# Patient Record
Sex: Female | Born: 1935
Health system: Southern US, Community
[De-identification: ages and names within clinical notes are randomized; demographics above are authoritative.]

## PROBLEM LIST (undated history)

## (undated) DIAGNOSIS — I499 Cardiac arrhythmia, unspecified: Secondary | ICD-10-CM

## (undated) DIAGNOSIS — I700 Atherosclerosis of aorta: Secondary | ICD-10-CM

## (undated) DIAGNOSIS — Z7901 Long term (current) use of anticoagulants: Secondary | ICD-10-CM

## (undated) DIAGNOSIS — I4510 Unspecified right bundle-branch block: Secondary | ICD-10-CM

## (undated) DIAGNOSIS — M722 Plantar fascial fibromatosis: Secondary | ICD-10-CM

## (undated) DIAGNOSIS — I779 Disorder of arteries and arterioles, unspecified: Secondary | ICD-10-CM

## (undated) DIAGNOSIS — Z8616 Personal history of COVID-19: Secondary | ICD-10-CM

## (undated) DIAGNOSIS — K429 Umbilical hernia without obstruction or gangrene: Secondary | ICD-10-CM

## (undated) DIAGNOSIS — N3281 Overactive bladder: Secondary | ICD-10-CM

## (undated) DIAGNOSIS — I10 Essential (primary) hypertension: Secondary | ICD-10-CM

## (undated) DIAGNOSIS — I482 Chronic atrial fibrillation, unspecified: Secondary | ICD-10-CM

## (undated) DIAGNOSIS — M1990 Unspecified osteoarthritis, unspecified site: Secondary | ICD-10-CM

## (undated) DIAGNOSIS — I2510 Atherosclerotic heart disease of native coronary artery without angina pectoris: Secondary | ICD-10-CM

## (undated) DIAGNOSIS — E785 Hyperlipidemia, unspecified: Secondary | ICD-10-CM

## (undated) DIAGNOSIS — D6869 Other thrombophilia: Secondary | ICD-10-CM

## (undated) HISTORY — PX: TONSILLECTOMY: SUR1361

## (undated) HISTORY — DX: Essential (primary) hypertension: I10

## (undated) HISTORY — DX: Cardiac arrhythmia, unspecified: I49.9

## (undated) HISTORY — PX: JOINT REPLACEMENT: SHX530

---

## 2005-06-21 ENCOUNTER — Ambulatory Visit: Payer: Self-pay | Admitting: Family Medicine

## 2006-03-07 ENCOUNTER — Emergency Department: Payer: Self-pay | Admitting: General Practice

## 2006-11-05 LAB — HM PAP SMEAR: HM Pap smear: NORMAL

## 2006-11-05 LAB — HM MAMMOGRAPHY: HM Mammogram: NORMAL

## 2011-03-07 LAB — FECAL OCCULT BLOOD, GUAIAC: Fecal Occult Blood: NEGATIVE

## 2011-11-05 ENCOUNTER — Encounter: Payer: Self-pay | Admitting: Internal Medicine

## 2011-11-05 ENCOUNTER — Ambulatory Visit (INDEPENDENT_AMBULATORY_CARE_PROVIDER_SITE_OTHER): Payer: Medicare Other | Admitting: Internal Medicine

## 2011-11-05 VITALS — BP 140/72 | HR 80 | Temp 97.9°F | Resp 16 | Ht 63.0 in | Wt 145.2 lb

## 2011-11-05 DIAGNOSIS — R002 Palpitations: Secondary | ICD-10-CM | POA: Insufficient documentation

## 2011-11-05 DIAGNOSIS — I482 Chronic atrial fibrillation, unspecified: Secondary | ICD-10-CM | POA: Insufficient documentation

## 2011-11-05 DIAGNOSIS — I4891 Unspecified atrial fibrillation: Secondary | ICD-10-CM

## 2011-11-05 DIAGNOSIS — I10 Essential (primary) hypertension: Secondary | ICD-10-CM | POA: Insufficient documentation

## 2011-11-05 MED ORDER — DILTIAZEM HCL ER 120 MG PO CP12: 120.0000 mg | ORAL_CAPSULE | Freq: Every day | ORAL | Status: DC

## 2011-11-05 NOTE — Assessment & Plan Note (Signed)
Atrial fib by today's ekg.

## 2011-11-05 NOTE — Assessment & Plan Note (Addendum)
Well controlled on lisinopril .  Increasing diltiazem to twice daily will likely lower it some more. Has history of hives reaction to prior bp medications and was treated at the [facility] ER for same.  Lists Maxzide, HTCz losartan and metoprolol as allergies but not sure which of the 4 she was taking at the time.

## 2011-11-05 NOTE — Progress Notes (Signed)
Patient ID: Laurie [name], female   DOB: 24-Feb-1931, 78 y.o.   MRN: [id number]  Patient Active Problem List  Diagnoses  . Heart palpitations  . Hypertension  . Atrial fibrillation    Subjective:  CC:   Chief Complaint  Patient presents with  . New Patient    HPI:   Laurie [name] a 78 y.o. female who presents New patient. Transferring from [facility]. [facility] in [place] .  Last seen there Fall for annual  exam without a pelvic.  Normal labs then.,  Hasa history of  hypertension and palpitations which typically have been ocurring in the evening at rest.  Feels like it races at times. Former PCP put her on bedtime diltiazem, no toehr workup. Has been takign the diltiazem at night.  Gets 6 to 7  Hours of sleep wakes up refreshed.   No bowel or bladder issues.  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   Has a lawnman  And son manages the financial responsibilities.  Feels overwhelmed . The lawnman takes care of everything but the flowers.    Enjoys taking care of flowers  Nut does not go for walks on the 16 acres like she used to before husbnad became frail. Afraid to walk alone.  Enjoys cooking and sewing,  Belongs to [employer].    Has seasonal allergies managed with chlor trimeton ,  No aspirin,  No tylenol on a regular basis.    No past medical history on file.  Past Surgical History  Procedure Date  . Tonsillectomy          The following portions of the patient's history were reviewed and updated as appropriate: Allergies, current medications, and problem list.    Review of Systems:   12 Pt  review of systems was negative except those addressed in the HPI,     History   Social History  . Marital Status: Married    Spouse Name: N/A    Number of Children: N/A  . Years of Education: N/A   Occupational History  . Not on file.   Social  History Main Topics  . Smoking status: Never Smoker   . Smokeless tobacco: Never Used  . Alcohol Use: No  . Drug Use: No  . Sexually Active: Not on file   Other Topics Concern  . Not on file   Social History Narrative  . No narrative on file    Objective:  BP 140/72  Pulse 80  Temp(Src) 97.9 F (36.6 C) (Oral)  Resp 16  Ht 5\' 3"  (1.6 m)  Wt 145 lb 4 oz (65.885 kg)  BMI 25.73 kg/m2  SpO2 97%  General appearance: alert, cooperative and appears stated age Ears: normal TM's and external ear canals both ears Throat: lips, mucosa, and tongue normal; teeth and gums normal Neck: no adenopathy, no carotid bruit, supple, symmetrical, trachea midline and thyroid not enlarged, symmetric, no tenderness/mass/nodules Back: symmetric, no curvature. ROM normal. No CVA tenderness. Lungs: clear to auscultation bilaterally Heart: irreg irreg rhythm , normal S1, S2 normal, no murmur, click, rub or gallop Abdomen: soft, non-tender; bowel sounds normal; no masses,  no organomegaly Pulses: 2+ and symmetric Skin: Skin color, texture, turgor normal. No rashes or lesions Lymph nodes: Cervical, supraclavicular, and axillary nodes normal.  Assessment and Plan:  Hypertension Well controlled on lisinopril .  Increasing diltiazem to twice daily will likely lower it some more. Has history of hives reaction to prior bp medications and was treated at the [facility] ER for same.  Lists Maxzide, HTCz losartan and metoprolol as allergies but not sure which of the 4 she was taking at the time.   Atrial fibrillation Not sure if she has always been in atrial fib since outside records are not available.  CHADS2 score is 2 at first glance,  Needs ECHO and labs which were done last fall by prior PCP, no history of DM or thyroid.  Resume full strength aspirin for now, refer to [facility] for eval.   Heart palpitations Atrial fib by today's ekg.      Updated Medication List Outpatient Encounter Prescriptions  as of 11/05/2011  Medication Sig Dispense Refill  . chlorpheniramine (CHLOR-TRIMETON) 4 MG tablet Take 4 mg by mouth 2 (two) times daily as needed.      . diltiazem (CARDIZEM SR) 120 MG 12 hr capsule Take 1 capsule (120 mg total) by mouth daily.  180 capsule  3  . fish oil-omega-3 fatty acids 1000 MG capsule Take 2 g by mouth daily.      Marland Kitchen lisinopril (PRINIVIL,ZESTRIL) 40 MG tablet Take 40 mg by mouth daily.      . Multiple Vitamins-Minerals (OSTEO COMPLEX PO) Take by mouth.      . DISCONTD: diltiazem (CARDIZEM SR) 120 MG 12 hr capsule Take 120 mg by mouth daily.         Orders Placed This Encounter  Procedures  . HM MAMMOGRAPHY  . HM PAP SMEAR  . Fecal Occult Blood, Guaiac  . Electrocardiogram report    No Follow-up on file.

## 2011-11-05 NOTE — Assessment & Plan Note (Signed)
Not sure if she has always been in atrial fib since outside records are not available.  CHADS2 score is 2 at first glance,  Needs ECHO and labs which were done last fall by prior PCP, no history of DM or thyroid.  Resume full strength aspirin for now, refer to [facility] for eval.

## 2011-11-05 NOTE — Patient Instructions (Signed)
Your heart appears to be irregular due to a rhythm called atrial fibrillation  Please start taking  a full strength aspirin daily until you are seen by the cardiologist, and increase the diltiazem to twice daily

## 2011-11-08 ENCOUNTER — Ambulatory Visit: Payer: Medicare Other | Admitting: Cardiovascular Disease

## 2011-11-16 ENCOUNTER — Encounter: Payer: Self-pay | Admitting: Cardiovascular Disease

## 2011-11-16 ENCOUNTER — Ambulatory Visit (INDEPENDENT_AMBULATORY_CARE_PROVIDER_SITE_OTHER): Payer: Medicare Other | Admitting: Cardiovascular Disease

## 2011-11-16 VITALS — BP 173/80 | HR 76 | Ht 63.0 in | Wt 144.0 lb

## 2011-11-16 DIAGNOSIS — I4891 Unspecified atrial fibrillation: Secondary | ICD-10-CM

## 2011-11-16 DIAGNOSIS — I10 Essential (primary) hypertension: Secondary | ICD-10-CM

## 2011-11-16 MED ORDER — RIVAROXABAN 20 MG PO TABS: 20.0000 mg | ORAL_TABLET | Freq: Every day | ORAL | Status: DC

## 2011-11-16 NOTE — Patient Instructions (Addendum)
Labs today.  Start Xarelto 20 mg once daily (Blood thinner)  Stop taking Aspirin.   Your physician has requested that you have an echocardiogram. Echocardiography is a painless test that uses sound waves to create images of your heart. It provides your doctor with information about the size and shape of your heart and how well your heart's chambers and valves are working. This procedure takes approximately one hour. There are no restrictions for this procedure.  Follow up after echo.

## 2011-11-17 LAB — CBC WITH DIFFERENTIAL/PLATELET
Basophils Absolute: 0.1 10*3/uL (ref 0.0–0.2)
HCT: 41.1 % (ref 34.0–46.6)
Immature Granulocytes: 0 % (ref 0–2)
Lymphocytes Absolute: 2 10*3/uL (ref 0.7–4.5)
MCHC: 34.1 g/dL (ref 31.5–35.7)
Monocytes Absolute: 0.3 10*3/uL (ref 0.1–1.0)
Monocytes: 4 % (ref 4–13)
RDW: 14.4 % (ref 12.3–15.4)

## 2011-11-17 LAB — HEPATIC FUNCTION PANEL
ALT: 23 IU/L (ref 0–32)
AST: 22 IU/L (ref 0–40)
Bilirubin, Direct: 0.15 mg/dL (ref 0.00–0.40)
Total Bilirubin: 0.6 mg/dL (ref 0.0–1.2)

## 2011-11-17 LAB — BASIC METABOLIC PANEL
GFR calc Af Amer: 91 mL/min/{1.73_m2} (ref >59)
GFR calc non Af Amer: 79 mL/min/{1.73_m2} (ref >59)
Potassium: 4.9 mmol/L (ref 3.5–5.2)
Sodium: 141 mmol/L (ref 134–144)

## 2011-11-17 LAB — PROTIME-INR: Prothrombin Time: 10.9 s (ref 9.1–12.0)

## 2011-11-19 NOTE — Assessment & Plan Note (Signed)
Patient is currently in atrial fibrillation. The exact onset if this is not very clear that based on her symptoms is at least of few months duration.  The ventricular rate seems to be reasonably controlled with current dose of diltiazem. Other than palpitations, she seems to be overall minimally symptomatic from this. Continue with rate control for now. The main issue that was discussed extensively today is her future risk of stroke prevention with anticoagulation. Her CHADS2 score is 2  and CHA2DS2 Vasc score is 4 corresponding with an annual stroke risk of 4%. Thus, I recommend anticoagulation. I discussed different options with her and decided to start Xarelto 20 mg once daily. I will obtain routine labs today including thyroid function. I will request an echocardiogram for further evaluation. If her atrial size is not significantly dilated, I favor cardioversion after a minimum of 3 weeks of anticoagulation to try to maintain sinus rhythm.

## 2011-11-19 NOTE — Progress Notes (Signed)
HPI  Laurie [name] a 78 y.o. female who was referred by Dr. Darrick Huntsman for evaluation and management of atrial fibrillation. She recently saw her as a new patient. She transferred care from [facility]. She had a history of hypertension and palpitations which typically have been ocurring in the evening at rest. Former PCP put her on bedtime diltiazem in April. She is not aware of history of arrhythmia or atrial fibrillation. The dose of diltiazem was recently changed to twice daily.  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 Has a lawnman And son manages the financial responsibilities. Feels overwhelmed . The lawnman takes care of everything but the flowers. Enjoys taking care of flowers. She is very active and has no significant physical limitations. She denies chest pain, dyspnea, orthopnea or PND. There has been no recent syncope, presyncope or falls. She was noted to be in atrial fibrillation during her last visit. She has no previous history of stroke.  Allergies  Allergen Reactions  . Hydrochlorothiazide   . Losartan   . Metoprolol   . Triamterene      Current Outpatient Prescriptions on File Prior to Visit  Medication Sig Dispense Refill  . chlorpheniramine (CHLOR-TRIMETON) 4 MG tablet Take 4 mg by mouth 2 (two) times daily as needed.      . diltiazem (CARDIZEM SR) 120 MG 12 hr capsule Take 120 mg by mouth 2 (two) times daily.      . fish oil-omega-3 fatty acids 1000 MG capsule Take 1 g by mouth daily.       Marland Kitchen lisinopril (PRINIVIL,ZESTRIL) 40 MG tablet Take 40 mg by mouth daily.      . Multiple Vitamins-Minerals (OSTEO COMPLEX PO) Take by mouth 2 (two) times daily.       . Rivaroxaban (XARELTO) 20 MG TABS Take 20 mg by mouth daily.  30 tablet  6     Past Medical History  Diagnosis Date  . Hypertension   . Arrhythmia      Past Surgical History  Procedure Date  . Tonsillectomy       Family History  Problem Relation Age of Onset  . Kidney disease Mother   . Anemia Mother   . Heart disease Father 78     History   Social History  . Marital Status: Married    Spouse Name: N/A    Number of Children: N/A  . Years of Education: N/A   Occupational History  . Not on file.   Social History Main Topics  . Smoking status: Never Smoker   . Smokeless tobacco: Never Used  . Alcohol Use: No  . Drug Use: No  . Sexually Active: Not on file   Other Topics Concern  . Not on file   Social History Narrative  . No narrative on file     ROS Constitutional: Negative for fever, chills, diaphoresis, activity change, appetite change and fatigue.  HENT: Negative for hearing loss, nosebleeds, congestion, sore throat, facial swelling, drooling, trouble swallowing, neck pain, voice change, sinus pressure and tinnitus.  Eyes: Negative for photophobia, pain, discharge and visual disturbance.  Respiratory: Negative for apnea, cough, chest tightness, shortness of breath and wheezing.  Cardiovascular: Negative for chest pain, palpitations and leg swelling.  Gastrointestinal: Negative for nausea, vomiting, abdominal pain, diarrhea, constipation, blood in stool and abdominal distention.  Genitourinary: Negative for dysuria, urgency, frequency, hematuria  and decreased urine volume.  Musculoskeletal: Negative for myalgias, back pain, joint swelling, arthralgias and gait problem.  Skin: Negative for color change, pallor, rash and wound.  Neurological: Negative for dizziness, tremors, seizures, syncope, speech difficulty, weakness, light-headedness, numbness and headaches.  Psychiatric/Behavioral: Negative for suicidal ideas, hallucinations, behavioral problems and agitation. The patient is not nervous/anxious.     PHYSICAL EXAM   BP 173/80  Pulse 76  Ht 5\' 3"  (1.6 m)  Wt 144 lb (65.318 kg)  BMI 25.51 kg/m2  Constitutional: She is oriented to person, place, and time. She  appears well-developed and well-nourished. No distress.  HENT: No nasal discharge.  Head: Normocephalic and atraumatic.  Eyes: Pupils are equal and round. Right eye exhibits no discharge. Left eye exhibits no discharge.  Neck: Normal range of motion. Neck supple. No JVD present. No thyromegaly present.  Cardiovascular: Normal rate, irregular rhythm, normal heart sounds. Exam reveals no gallop and no friction rub. No murmur heard.  Pulmonary/Chest: Effort normal and breath sounds normal. No stridor. No respiratory distress. She has no wheezes. She has no rales. She exhibits no tenderness.  Abdominal: Soft. Bowel sounds are normal. She exhibits no distension. There is no tenderness. There is no rebound and no guarding.  Musculoskeletal: Normal range of motion. She exhibits no edema and no tenderness.  Neurological: She is alert and oriented to person, place, and time. Coordination normal.  Skin: Skin is warm and dry. No rash noted. She is not diaphoretic. No erythema. No pallor.  Psychiatric: She has a normal mood and affect. Her behavior is normal. Judgment and thought content normal.    EKG: Atrial fibrillation with a ventricular rate of 75 beats per minute ABNORMAL RHYTHM    ASSESSMENT AND PLAN

## 2011-11-19 NOTE — Assessment & Plan Note (Signed)
Blood pressure is well controlled. Continue current treatment. 

## 2011-11-28 ENCOUNTER — Other Ambulatory Visit (INDEPENDENT_AMBULATORY_CARE_PROVIDER_SITE_OTHER): Payer: Medicare Other

## 2011-11-28 ENCOUNTER — Other Ambulatory Visit: Payer: Self-pay

## 2011-11-28 DIAGNOSIS — I4891 Unspecified atrial fibrillation: Secondary | ICD-10-CM

## 2011-12-04 ENCOUNTER — Ambulatory Visit: Payer: Medicare Other | Admitting: Cardiovascular Disease

## 2011-12-07 ENCOUNTER — Encounter: Payer: Self-pay | Admitting: Cardiovascular Disease

## 2011-12-07 ENCOUNTER — Ambulatory Visit (INDEPENDENT_AMBULATORY_CARE_PROVIDER_SITE_OTHER): Payer: Medicare Other | Admitting: Cardiovascular Disease

## 2011-12-07 VITALS — BP 170/80 | HR 88 | Ht 63.0 in | Wt 147.0 lb

## 2011-12-07 DIAGNOSIS — I10 Essential (primary) hypertension: Secondary | ICD-10-CM

## 2011-12-07 DIAGNOSIS — I4891 Unspecified atrial fibrillation: Secondary | ICD-10-CM

## 2011-12-07 NOTE — Patient Instructions (Addendum)
Continue same medications.  Monitor your blood pressure 2-3 times/week. The goal is less than 140/90.  Follow up in 6 months.

## 2011-12-07 NOTE — Assessment & Plan Note (Signed)
Patient is currently in atrial fibrillation. The exact onset if this is not very clear that it seems that she had this mention in her record in April of 2012 at [facility].  The ventricular rate seems to be reasonably controlled with current dose of diltiazem. Other than palpitations, she seems to be overall minimally symptomatic from this. Continue with rate control for now. Continue anticoagulation with Xarelto 20 mg daily which she seems to be tolerating well.  Her labs were unremarkable. Echocardiogram showed normal LV systolic function with mildly dilated left atrium. Given that she is minimally symptomatic from atrial fibrillation at this time, and the duration of A. fib is likely more than 6 months, I would continue with rate control at this time without attempting cardioversion.

## 2011-12-07 NOTE — Progress Notes (Signed)
HPI  Laurie [name] a 78 y.o. female who is here today for a followup visit for atrial fibrillation. She used to be seen at [facility]. It appears that she has history of paroxysmal atrial fibrillation for a while. It was mentioned in her records from April 2012. She had a stress echo done at that time which showed no evidence of ischemia. She is being treated with diltiazem for rate control. During her first visit with me, I started her on Xarelto for anticoagulation. I did labs which were completely unremarkable. She is doing well. She is very active and has no significant physical limitations. She denies chest pain, dyspnea, orthopnea or PND. There has been no recent syncope, presyncope or falls.     Allergies  Allergen Reactions  . Hydrochlorothiazide   . Losartan   . Metoprolol   . Triamterene      Current Outpatient Prescriptions on File Prior to Visit  Medication Sig Dispense Refill  . chlorpheniramine (CHLOR-TRIMETON) 4 MG tablet Take 4 mg by mouth 2 (two) times daily as needed.      . diltiazem (CARDIZEM SR) 120 MG 12 hr capsule Take 120 mg by mouth 2 (two) times daily.      . fish oil-omega-3 fatty acids 1000 MG capsule Take 1 g by mouth daily.       Marland Kitchen lisinopril (PRINIVIL,ZESTRIL) 40 MG tablet Take 40 mg by mouth daily.      . Multiple Vitamins-Minerals (OSTEO COMPLEX PO) Take by mouth 2 (two) times daily.       . Rivaroxaban (XARELTO) 20 MG TABS Take 20 mg by mouth daily.  30 tablet  6     Past Medical History  Diagnosis Date  . Hypertension   . Arrhythmia      Past Surgical History  Procedure Date  . Tonsillectomy      Family History  Problem Relation Age of Onset  . Kidney disease Mother   . Anemia Mother   . Heart disease Father 72     History   Social History  . Marital Status: Married    Spouse Name: N/A    Number of Children: N/A  . Years of Education: N/A   Occupational History  . Not on file.   Social History Main Topics  . Smoking  status: Never Smoker   . Smokeless tobacco: Never Used  . Alcohol Use: No  . Drug Use: No  . Sexually Active: Not on file   Other Topics Concern  . Not on file   Social History Narrative  . No narrative on file      PHYSICAL EXAM   BP 170/80  Pulse 88  Ht 5\' 3"  (1.6 m)  Wt 147 lb (66.679 kg)  BMI 26.04 kg/m2 Constitutional: She is oriented to person, place, and time. She appears well-developed and well-nourished. No distress.  HENT: No nasal discharge.  Head: Normocephalic and atraumatic.  Eyes: Pupils are equal and round. Right eye exhibits no discharge. Left eye exhibits no discharge.  Neck: Normal range of motion. Neck supple. No JVD present. No thyromegaly present.  Cardiovascular: Normal rate, regular rhythm, normal heart sounds. Exam reveals no gallop and no friction rub. No murmur heard.  Pulmonary/Chest: Effort normal and breath sounds normal. No stridor. No respiratory distress. She has no wheezes. She has no rales. She exhibits no tenderness.  Abdominal: Soft. Bowel sounds are normal. She exhibits no distension. There is no tenderness. There is no rebound and no  guarding.  Musculoskeletal: Normal range of motion. She exhibits no edema and no tenderness.  Neurological: She is alert and oriented to person, place, and time. Coordination normal.  Skin: Skin is warm and dry. No rash noted. She is not diaphoretic. No erythema. No pallor.  Psychiatric: She has a normal mood and affect. Her behavior is normal. Judgment and thought content normal.      ASSESSMENT AND PLAN

## 2011-12-07 NOTE — Assessment & Plan Note (Signed)
Her blood pressure is elevated today. She is allergic to multiple blood pressure medications.  She mentions that her blood pressure is always high when she is at the doctor's office. I asked her to monitor her blood pressure at least 2 times a week and bring the readings and followup visit with me or with Dr. Darrick Huntsman.

## 2011-12-14 ENCOUNTER — Telehealth: Payer: Self-pay | Admitting: Cardiovascular Disease

## 2011-12-14 NOTE — Telephone Encounter (Signed)
Xarelto does not cause blurred vision.  Schedule her for carotid Duplex ultrasound. She should see PCP as well.

## 2011-12-14 NOTE — Telephone Encounter (Signed)
LMTCB

## 2011-12-14 NOTE — Telephone Encounter (Signed)
Pt says she has noticed blurred vision over thye last week. She says these symptoms are intermittent and are not associated with any other symptoms such as headache, slurred speech or elevated BP. BP=117/85   She asks if Xarelto can cause this.  I reassured her, based on data re: Xarelto, this is not a known SE.  I told her I would talk with Dr. Kirke Corin to see what we need to do, if anything.  She asks if taking med QOD vs. QD would help.  I strongly advised against this since this is an every day med and is protecting her from CVA secondary to atrial fib.  She verb understanding and will take QD.   In the meantime, I will review with Dr. Kirke Corin to get his advice and call pt back.  Understanding verb.

## 2011-12-14 NOTE — Telephone Encounter (Signed)
Please read below and advise. Thanks

## 2011-12-14 NOTE — Telephone Encounter (Signed)
Pt called stating that she has had some occasional blurred vision and wonders if the xarelto is causing this

## 2011-12-17 NOTE — Telephone Encounter (Signed)
Pt called back.  She was informed of Dr. Jari Sportsman plan.  I stressed to her the importance of staying on the Xarelto and have carotid u/s and f/u with PCP.  Understanding verb. Transferred to [facility] to schedule test.

## 2011-12-26 ENCOUNTER — Other Ambulatory Visit: Payer: Self-pay | Admitting: Cardiology

## 2011-12-26 DIAGNOSIS — H538 Other visual disturbances: Secondary | ICD-10-CM

## 2012-01-01 ENCOUNTER — Encounter (INDEPENDENT_AMBULATORY_CARE_PROVIDER_SITE_OTHER): Payer: Medicare Other

## 2012-01-01 DIAGNOSIS — I6529 Occlusion and stenosis of unspecified carotid artery: Secondary | ICD-10-CM

## 2012-01-01 DIAGNOSIS — H538 Other visual disturbances: Secondary | ICD-10-CM

## 2012-01-14 ENCOUNTER — Telehealth: Payer: Self-pay | Admitting: Internal Medicine

## 2012-01-14 DIAGNOSIS — E041 Nontoxic single thyroid nodule: Secondary | ICD-10-CM

## 2012-01-14 NOTE — Telephone Encounter (Signed)
Please tell ms Laurie [name] that Dr. Kirke Corin found cysts on her thyroid during her recent carotid doppler so I would like her to get  a dedicated thyroid ultrasound. Order in [facility]

## 2012-01-15 ENCOUNTER — Telehealth: Payer: Self-pay | Admitting: Internal Medicine

## 2012-01-15 MED ORDER — DILTIAZEM HCL ER 120 MG PO CP12: 120.0000 mg | ORAL_CAPSULE | Freq: Two times a day (BID) | ORAL | Status: DC

## 2012-01-15 NOTE — Telephone Encounter (Signed)
Patient notified

## 2012-01-15 NOTE — Telephone Encounter (Signed)
Left message asking patient to return my call.

## 2012-01-15 NOTE — Telephone Encounter (Signed)
Patient needs a new prescription faxed into [facility] on [address] [place] for her diltiazem 120mg  she takes 2 a day.  She states that the refill they have on file is for once a day,

## 2012-01-17 ENCOUNTER — Ambulatory Visit: Payer: Self-pay | Admitting: Internal Medicine

## 2012-01-17 ENCOUNTER — Telehealth: Payer: Self-pay | Admitting: Internal Medicine

## 2012-01-17 DIAGNOSIS — E041 Nontoxic single thyroid nodule: Secondary | ICD-10-CM

## 2012-01-17 NOTE — Telephone Encounter (Signed)
Her thyroid ultrasound did show several cystic nodules.  These are usually benign, but very rarely they can be  cancerous .   I would like to set her up with an endocrinologist to discuss whether hers should be biopsied.  Dr. Renae Fickle and Dr. Tedd Sias are  excellent female endocrinologists who works in [place] at the [facility] .  I will arrange for followup on this she would like to go somewhere else

## 2012-01-17 NOTE — Telephone Encounter (Signed)
Patient notified, she says that she is happy to see who every you recommend.

## 2012-01-18 NOTE — Telephone Encounter (Signed)
I would like to repeat her thyroid function test (lab draw) prior to Endocrine visit.   Endocrine referral and lab test  for thyroid cyst in [facility]>

## 2012-01-18 NOTE — Telephone Encounter (Signed)
Left message asking patient to return my call.

## 2012-01-21 NOTE — Telephone Encounter (Signed)
Patient notified.  Lab appt scheduled.

## 2012-01-22 ENCOUNTER — Other Ambulatory Visit (INDEPENDENT_AMBULATORY_CARE_PROVIDER_SITE_OTHER): Payer: Medicare Other | Admitting: *Deleted

## 2012-01-22 DIAGNOSIS — E041 Nontoxic single thyroid nodule: Secondary | ICD-10-CM

## 2012-01-22 LAB — T4, FREE: Free T4: 0.63 ng/dL (ref 0.60–1.60)

## 2012-01-23 ENCOUNTER — Encounter: Payer: Self-pay | Admitting: Internal Medicine

## 2012-01-25 ENCOUNTER — Telehealth: Payer: Self-pay | Admitting: Internal Medicine

## 2012-01-25 NOTE — Telephone Encounter (Signed)
Patient notified

## 2012-01-25 NOTE — Telephone Encounter (Signed)
Patient wanting her lab results

## 2012-02-06 ENCOUNTER — Telehealth: Payer: Self-pay | Admitting: Internal Medicine

## 2012-02-06 MED ORDER — LISINOPRIL 40 MG PO TABS: 40.0000 mg | ORAL_TABLET | Freq: Every day | ORAL | Status: DC

## 2012-02-06 NOTE — Telephone Encounter (Signed)
Refill on Lisinopril 40 mg.

## 2012-02-08 ENCOUNTER — Telehealth: Payer: Self-pay | Admitting: Internal Medicine

## 2012-02-08 NOTE — Telephone Encounter (Signed)
This was done yesterday, I have already told patient, she was very rude and was saying that she has been trying to get this filled since wed. I explained to her that I received the request wed afternoon and we do require 24 to 48 hrs to allow [place] time to fill it and this was filled on Thursday. Patient said that she should not have to wait a whole day for her rx to be filled.

## 2012-02-08 NOTE — Telephone Encounter (Signed)
Pt stated that she has been calling for 3 days checking on her lisinopril 40mg  Pt stated that [facility] [place] [facility] told her that she needs a new rx PLEASE advise pt when this is done Pt stated she has 3 left   Please call pt

## 2012-03-10 ENCOUNTER — Encounter: Payer: Self-pay | Admitting: Internal Medicine

## 2012-03-10 ENCOUNTER — Ambulatory Visit (INDEPENDENT_AMBULATORY_CARE_PROVIDER_SITE_OTHER): Payer: Medicare Other | Admitting: Internal Medicine

## 2012-03-10 VITALS — BP 132/78 | HR 75 | Temp 97.6°F | Ht 64.0 in | Wt 147.2 lb

## 2012-03-10 DIAGNOSIS — Z13220 Encounter for screening for lipoid disorders: Secondary | ICD-10-CM | POA: Insufficient documentation

## 2012-03-10 DIAGNOSIS — Z0000 Encounter for general adult medical examination without abnormal findings: Secondary | ICD-10-CM | POA: Insufficient documentation

## 2012-03-10 DIAGNOSIS — Z7901 Long term (current) use of anticoagulants: Secondary | ICD-10-CM | POA: Insufficient documentation

## 2012-03-10 DIAGNOSIS — Z124 Encounter for screening for malignant neoplasm of cervix: Secondary | ICD-10-CM

## 2012-03-10 DIAGNOSIS — Z79899 Other long term (current) drug therapy: Secondary | ICD-10-CM

## 2012-03-10 DIAGNOSIS — Z1211 Encounter for screening for malignant neoplasm of colon: Secondary | ICD-10-CM

## 2012-03-10 DIAGNOSIS — I10 Essential (primary) hypertension: Secondary | ICD-10-CM

## 2012-03-10 LAB — COMPREHENSIVE METABOLIC PANEL
ALT: 16 U/L (ref 0–35)
Alkaline Phosphatase: 66 U/L (ref 39–117)
BUN: 13 mg/dL (ref 6–23)
CO2: 28 mEq/L (ref 19–32)
Calcium: 9.4 mg/dL (ref 8.4–10.5)
Chloride: 103 mEq/L (ref 96–112)
Creatinine, Ser: 0.8 mg/dL (ref 0.4–1.2)
GFR: 71.47 mL/min (ref >60.00)
Glucose, Bld: 93 mg/dL (ref 70–99)
Sodium: 139 mEq/L (ref 135–145)
Total Bilirubin: 1 mg/dL (ref 0.3–1.2)
Total Protein: 7.3 g/dL (ref 6.0–8.3)

## 2012-03-10 LAB — LIPID PANEL
Cholesterol: 241 mg/dL — ABNORMAL HIGH (ref 0–200)
Triglycerides: 194 mg/dL — ABNORMAL HIGH (ref 0.0–149.0)

## 2012-03-10 LAB — LDL CHOLESTEROL, DIRECT: Direct LDL: 154.1 mg/dL

## 2012-03-10 NOTE — Patient Instructions (Addendum)
You have decided not to have an annual  Mammogram to screen for breast cancer and have declined a flu vaccine  If you change your mind,  Please let me know.  You should try to get 1200 mg of calcium  Daily and 1000 units of Vit D3 daily  Use a chewable calcium tablet once daly and get the remainder from your diet.   You can take an over the counter D3 supplement,  800 to 1000 units daily

## 2012-03-10 NOTE — Progress Notes (Signed)
Patient ID: Laurie [name], female   DOB: 22-Aug-1930, 78 y.o.   MRN: [id number]  The patient is here for annual Medicare wellness examination and management of other chronic and acute problems.   The risk factors are reflected in the social history.  The roster of all physicians providing medical care to patient - is listed in the Snapshot section of the chart.  Activities of daily living:  The patient is 100% independent in all ADLs: dressing, toileting, feeding as well as independent mobility  Home safety : The patient has smoke detectors in the home. They wear seatbelts.  There are no firearms at home. There is no violence in the home.   There is no risks for hepatitis, STDs or HIV. There is no   history of blood transfusion. They have no travel history to infectious disease endemic areas of the world.  The patient has seen their dentist in the last six month. They have seen their eye doctor in the last year. They admit to slight hearing difficulty with regard to whispered voices and some television programs.  They have deferred audiologic testing in the last year.  They do not  have excessive sun exposure. Discussed the need for sun protection: hats, long sleeves and use of sunscreen if there is significant sun exposure.   Diet: the importance of a healthy diet is discussed. They do have a healthy diet.  The benefits of regular aerobic exercise were discussed. She walks 4 times per week ,  20 minutes.   Depression screen: there are no signs or vegative symptoms of depression- irritability, change in appetite, anhedonia, sadness/tearfullness.  Cognitive assessment: the patient manages all their financial and personal affairs and is actively engaged. They could relate day,date,year and events; recalled 2/3 objects at 3 minutes; performed clock-face test normally.  The following portions of the patient's history were reviewed and updated as appropriate: allergies, current medications, past  family history, past medical history,  past surgical history, past social history  and problem list.  Visual acuity was not assessed per patient preference since she has regular follow up with her ophthalmologist. Hearing and body mass index were assessed and reviewed.   During the course of the visit the patient was educated and counseled about appropriate screening and preventive services including : fall prevention , diabetes screening, nutrition counseling, colorectal cancer screening, and recommended immunizations.    BP 132/78  Pulse 75  Temp 97.6 F (36.4 C) (Oral)  Ht 5\' 4"  (1.626 m)  Wt 147 lb 4 oz (66.792 kg)  BMI 25.28 kg/m2  SpO2 98%  General appearance: alert, cooperative and appears stated age Head: Normocephalic, without obvious abnormality, atraumatic Eyes: conjunctivae/corneas clear. PERRL, EOM's intact. Fundi benign. Ears: normal TM's and external ear canals both ears Nose: Nares normal. Septum midline. Mucosa normal. No drainage or sinus tenderness. Throat: lips, mucosa, and tongue normal; teeth and gums normal Neck: no adenopathy, no carotid bruit, no JVD, supple, symmetrical, trachea midline and thyroid not enlarged, symmetric, no tenderness/mass/nodules Lungs: clear to auscultation bilaterally Breasts: normal appearance, no masses or tenderness Heart: regular rate and rhythm, S1, S2 normal, no murmur, click, rub or gallop Abdomen: soft, non-tender; bowel sounds normal; no masses,  no organomegaly Pelvic exam: VULVA: normal appearing vulva with no masses, tenderness or lesions, VAGINA: normal appearing vagina with normal color and discharge, no lesions, CERVIX: normal appearing cervix without discharge or lesions, UTERUS: uterus is normal size, shape, consistency and nontender, ADNEXA: normal adnexa in size,  nontender and no masses.  Extremities: extremities normal, atraumatic, no cyanosis or edema Pulses: 2+ and symmetric Skin: Skin color, texture, turgor normal.  No rashes or lesions Neurologic: Alert and oriented X 3, normal strength and tone. Normal symmetric reflexes. Normal coordination and gait.

## 2012-03-10 NOTE — Assessment & Plan Note (Signed)
Breast pelvic and PAP smear was done today.

## 2012-03-10 NOTE — Assessment & Plan Note (Signed)
She refuses colonoscopy, but prefers to continue annual FOBTs

## 2012-04-04 ENCOUNTER — Telehealth: Payer: Self-pay | Admitting: Internal Medicine

## 2012-04-04 NOTE — Telephone Encounter (Signed)
Caller: [name]/Patient; Patient Name: Laurie [name]; PCP: Duncan Dull (Adults only); Best Callback Phone Number: [phone]  Has had pain on urination with blood in urine today.  No abd or back pain.  No fever.   No appts this afternoon.  Needs to be seen in 24 hours per [facility] protocal.  Scheduled for 0800 at the [facility] office for 11/2.  Gave directions per map quest to patient.   She is to push fluids with decreased caffiene and tomato products.  May use AZO for the discomfort and Tylenol q4 hours PRN.

## 2012-04-05 ENCOUNTER — Ambulatory Visit: Payer: Medicare Other | Admitting: General Practice

## 2012-05-31 ENCOUNTER — Other Ambulatory Visit: Payer: Self-pay | Admitting: Internal Medicine

## 2012-06-16 ENCOUNTER — Encounter: Payer: Self-pay | Admitting: Cardiovascular Disease

## 2012-06-16 ENCOUNTER — Ambulatory Visit (INDEPENDENT_AMBULATORY_CARE_PROVIDER_SITE_OTHER): Payer: Medicare Other | Admitting: Cardiovascular Disease

## 2012-06-16 ENCOUNTER — Other Ambulatory Visit: Payer: Self-pay

## 2012-06-16 VITALS — BP 132/84 | HR 81 | Ht 64.0 in | Wt 146.5 lb

## 2012-06-16 DIAGNOSIS — I4891 Unspecified atrial fibrillation: Secondary | ICD-10-CM

## 2012-06-16 DIAGNOSIS — I10 Essential (primary) hypertension: Secondary | ICD-10-CM

## 2012-06-16 MED ORDER — RIVAROXABAN 20 MG PO TABS: 20.0000 mg | ORAL_TABLET | Freq: Every day | ORAL | Status: DC

## 2012-06-16 NOTE — Progress Notes (Signed)
HPI  [name] is a pleasant 79 y.o. female who is here today for a followup visit regarding chronic atrial fibrillation. This was initially diagnosed in 2012 at [facility]. She had a stress echo done at that time which showed no evidence of ischemia. She is being treated with diltiazem for rate control. Echocardiogram showed normal LV systolic function and mildly dilated left atrium. She was started on anticoagulation with Xarelto. Initially she complained of blurred vision which she thought was due to the medication. We did carotid duplex ultrasound which showed no evidence of obstructive disease. There was incidental finding of thyroid cysts which is being followed by Dr. Darrick Huntsman. Overall, she is doing well and denies any chest pain, dyspnea or palpitations.   Allergies  Allergen Reactions  . Hydrochlorothiazide   . Losartan   . Metoprolol   . Triamterene      Current Outpatient Prescriptions on File Prior to Visit  Medication Sig Dispense Refill  . Calcium-Vitamin D (CALCET PO) Take 2 tablets by mouth 2 (two) times daily at 10 AM and 5 PM.      . chlorpheniramine (CHLOR-TRIMETON) 4 MG tablet Take 4 mg by mouth 2 (two) times daily as needed.      . diltiazem (CARDIZEM SR) 120 MG 12 hr capsule Take 1 capsule (120 mg total) by mouth 2 (two) times daily.  60 capsule  3  . fish oil-omega-3 fatty acids 1000 MG capsule Take 1 g by mouth daily.       Marland Kitchen lisinopril (PRINIVIL,ZESTRIL) 40 MG tablet TAKE 1 TABLET BY MOUTH EVERY DAY  30 tablet  3  . Rivaroxaban (XARELTO) 20 MG TABS Take 20 mg by mouth daily.  30 tablet  6     Past Medical History  Diagnosis Date  . Hypertension   . Arrhythmia      Past Surgical History  Procedure Date  . Tonsillectomy      Family History  Problem Relation Age of Onset  . Kidney disease Mother   . Anemia Mother   . Heart disease Father 64     History   Social History  . Marital Status: Married    Spouse Name: N/A    Number of Children: N/A  .  Years of Education: N/A   Occupational History  . Not on file.   Social History Main Topics  . Smoking status: Never Smoker   . Smokeless tobacco: Never Used  . Alcohol Use: No  . Drug Use: No  . Sexually Active: Not on file   Other Topics Concern  . Not on file   Social History Narrative  . No narrative on file      PHYSICAL EXAM   BP 132/84  Pulse 81  Ht 5\' 4"  (1.626 m)  Wt 146 lb 8 oz (66.452 kg)  BMI 25.15 kg/m2 Constitutional: She is oriented to person, place, and time. She appears well-developed and well-nourished. No distress.  HENT: No nasal discharge.  Head: Normocephalic and atraumatic.  Eyes: Pupils are equal and round. Right eye exhibits no discharge. Left eye exhibits no discharge.  Neck: Normal range of motion. Neck supple. No JVD present. No thyromegaly present.  Cardiovascular: Normal rate, irregular rhythm, normal heart sounds. Exam reveals no gallop and no friction rub. No murmur heard.  Pulmonary/Chest: Effort normal and breath sounds normal. No stridor. No respiratory distress. She has no wheezes. She has no rales. She exhibits no tenderness.  Abdominal: Soft. Bowel sounds are normal. She  exhibits no distension. There is no tenderness. There is no rebound and no guarding.  Musculoskeletal: Normal range of motion. She exhibits no edema and no tenderness.  Neurological: She is alert and oriented to person, place, and time. Coordination normal.  Skin: Skin is warm and dry. No rash noted. She is not diaphoretic. No erythema. No pallor.  Psychiatric: She has a normal mood and affect. Her behavior is normal. Judgment and thought content normal.    EKG: Atrial fibrillation  -Poor R-wave progression -nonspecific -consider old anterior infarct.   ABNORMAL RHYTHM  ASSESSMENT AND PLAN

## 2012-06-16 NOTE — Assessment & Plan Note (Signed)
Blood pressure is well controlled on current medications. 

## 2012-06-16 NOTE — Assessment & Plan Note (Signed)
Her ventricular rate seems to be well-controlled on current dose of diltiazem 120 mg twice daily. She is tolerating anticoagulation without any reported side effects. Continue current management.

## 2012-06-16 NOTE — Patient Instructions (Addendum)
Continue same medications.  Follow up in 6 months.  

## 2012-06-22 ENCOUNTER — Other Ambulatory Visit: Payer: Self-pay | Admitting: Internal Medicine

## 2012-07-22 ENCOUNTER — Ambulatory Visit: Payer: Self-pay | Admitting: Internal Medicine

## 2012-09-09 ENCOUNTER — Encounter: Payer: Self-pay | Admitting: Internal Medicine

## 2012-09-09 ENCOUNTER — Ambulatory Visit (INDEPENDENT_AMBULATORY_CARE_PROVIDER_SITE_OTHER): Payer: Medicare Other | Admitting: Internal Medicine

## 2012-09-09 VITALS — BP 128/78 | HR 74 | Temp 97.8°F | Resp 16 | Wt 144.5 lb

## 2012-09-09 DIAGNOSIS — Z79899 Other long term (current) drug therapy: Secondary | ICD-10-CM

## 2012-09-09 DIAGNOSIS — E785 Hyperlipidemia, unspecified: Secondary | ICD-10-CM

## 2012-09-09 DIAGNOSIS — I4891 Unspecified atrial fibrillation: Secondary | ICD-10-CM

## 2012-09-09 DIAGNOSIS — I10 Essential (primary) hypertension: Secondary | ICD-10-CM

## 2012-09-09 MED ORDER — LEVOFLOXACIN 500 MG PO TABS: 500.0000 mg | ORAL_TABLET | Freq: Every day | ORAL | Status: DC

## 2012-09-09 NOTE — Assessment & Plan Note (Signed)
Rate controlled on diltiazem. Anticoagulated with Xarelto. Marland Kitchen No changes today.

## 2012-09-09 NOTE — Assessment & Plan Note (Signed)
Well controlled on current regimen. Renal function stable, no changes today. 

## 2012-09-09 NOTE — Patient Instructions (Addendum)
Please take the levaquin with you on your trip to [place] in the event you get a bladder or sinus infection   Return for fasting labs so we can recheck your cholesterol
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
## 2012-09-10 ENCOUNTER — Other Ambulatory Visit (INDEPENDENT_AMBULATORY_CARE_PROVIDER_SITE_OTHER): Payer: Medicare Other

## 2012-09-10 DIAGNOSIS — E041 Nontoxic single thyroid nodule: Secondary | ICD-10-CM

## 2012-09-10 DIAGNOSIS — Z79899 Other long term (current) drug therapy: Secondary | ICD-10-CM

## 2012-09-10 DIAGNOSIS — E785 Hyperlipidemia, unspecified: Secondary | ICD-10-CM

## 2012-09-10 LAB — LIPID PANEL
Cholesterol: 229 mg/dL — ABNORMAL HIGH (ref 0–200)
Total CHOL/HDL Ratio: 5
VLDL: 21 mg/dL (ref 0.0–40.0)

## 2012-09-10 LAB — COMPREHENSIVE METABOLIC PANEL
ALT: 17 U/L (ref 0–35)
AST: 17 U/L (ref 0–37)
Albumin: 4 g/dL (ref 3.5–5.2)
CO2: 28 mEq/L (ref 19–32)
Calcium: 9.8 mg/dL (ref 8.4–10.5)
Chloride: 103 mEq/L (ref 96–112)
GFR: 71.38 mL/min (ref >60.00)
Potassium: 4.1 mEq/L (ref 3.5–5.1)
Sodium: 138 mEq/L (ref 135–145)
Total Protein: 7 g/dL (ref 6.0–8.3)

## 2012-09-11 MED ORDER — PRAVASTATIN SODIUM 20 MG PO TABS: 20.0000 mg | ORAL_TABLET | Freq: Every day | ORAL | Status: DC

## 2012-09-11 NOTE — Addendum Note (Signed)
Addended by: Sherlene Shams on: 09/11/2012 11:42 PM   Modules accepted: Orders

## 2012-09-27 ENCOUNTER — Other Ambulatory Visit: Payer: Self-pay | Admitting: Internal Medicine

## 2012-10-16 ENCOUNTER — Other Ambulatory Visit: Payer: Self-pay | Admitting: Internal Medicine

## 2012-10-16 NOTE — Telephone Encounter (Signed)
Rx sent to pharmacy by escript  

## 2012-12-15 ENCOUNTER — Other Ambulatory Visit (INDEPENDENT_AMBULATORY_CARE_PROVIDER_SITE_OTHER): Payer: Medicare Other

## 2012-12-15 ENCOUNTER — Ambulatory Visit: Payer: Medicare Other | Admitting: Cardiovascular Disease

## 2012-12-15 DIAGNOSIS — Z79899 Other long term (current) drug therapy: Secondary | ICD-10-CM

## 2012-12-15 DIAGNOSIS — E785 Hyperlipidemia, unspecified: Secondary | ICD-10-CM

## 2012-12-15 LAB — COMPREHENSIVE METABOLIC PANEL
ALT: 17 U/L (ref 0–35)
AST: 19 U/L (ref 0–37)
Calcium: 9.6 mg/dL (ref 8.4–10.5)
Chloride: 106 mEq/L (ref 96–112)
Creatinine, Ser: 0.9 mg/dL (ref 0.4–1.2)

## 2012-12-15 LAB — LIPID PANEL
LDL Cholesterol: 96 mg/dL (ref 0–99)
Total CHOL/HDL Ratio: 3
Triglycerides: 114 mg/dL (ref 0.0–149.0)

## 2012-12-16 ENCOUNTER — Encounter: Payer: Self-pay | Admitting: *Deleted

## 2012-12-18 ENCOUNTER — Encounter: Payer: Self-pay | Admitting: Cardiovascular Disease

## 2012-12-18 ENCOUNTER — Ambulatory Visit (INDEPENDENT_AMBULATORY_CARE_PROVIDER_SITE_OTHER): Payer: Medicare Other | Admitting: Cardiovascular Disease

## 2012-12-18 VITALS — BP 160/90 | HR 90 | Ht 64.0 in | Wt 146.5 lb

## 2012-12-18 DIAGNOSIS — I10 Essential (primary) hypertension: Secondary | ICD-10-CM

## 2012-12-18 DIAGNOSIS — I4891 Unspecified atrial fibrillation: Secondary | ICD-10-CM

## 2012-12-18 NOTE — Patient Instructions (Addendum)
Continue same medications.  Follow up in 6 months.  

## 2012-12-18 NOTE — Progress Notes (Signed)
HPI  Laurie [name] is a pleasant 79 y.o. female who is here today for a followup visit regarding chronic atrial fibrillation. This was initially diagnosed in 2012 at [facility]. She had a stress echo done at that time which showed no evidence of ischemia. She is being treated with diltiazem for rate control. Echocardiogram showed normal LV systolic function and mildly dilated left atrium. She was started on anticoagulation with Xarelto. Initially she complained of blurred vision which she thought was due to the medication. We did carotid duplex ultrasound which showed no evidence of obstructive disease.  Overall she has been doing well and denies any chest pain, dyspnea or palpitations.  Allergies  Allergen Reactions  . Hydrochlorothiazide   . Losartan   . Metoprolol   . Triamterene      Current Outpatient Prescriptions on File Prior to Visit  Medication Sig Dispense Refill  . Calcium-Vitamin D (CALCET PO) Take 2 tablets by mouth daily.       . chlorpheniramine (CHLOR-TRIMETON) 4 MG tablet Take 4 mg by mouth 2 (two) times daily as needed.      . diltiazem (DILACOR XR) 120 MG 24 hr capsule TAKE 1 CAPSULE (120 MG TOTAL) BY MOUTH 2 (TWO) TIMES DAILY.  60 capsule  5  . fish oil-omega-3 fatty acids 1000 MG capsule Take 1 g by mouth daily.       Marland Kitchen levofloxacin (LEVAQUIN) 500 MG tablet Take 1 tablet (500 mg total) by mouth daily.  7 tablet  0  . lisinopril (PRINIVIL,ZESTRIL) 40 MG tablet TAKE 1 TABLET BY MOUTH EVERY DAY  30 tablet  3  . pravastatin (PRAVACHOL) 20 MG tablet Take 1 tablet (20 mg total) by mouth daily.  90 tablet  3  . Rivaroxaban (XARELTO) 20 MG TABS Take 1 tablet (20 mg total) by mouth daily.  30 tablet  6   No current facility-administered medications on file prior to visit.     Past Medical History  Diagnosis Date  . Hypertension   . Arrhythmia      Past Surgical History  Procedure Laterality Date  . Tonsillectomy       Family History  Problem Relation Age of Onset    . Kidney disease Mother   . Anemia Mother   . Heart disease Father 43     History   Social History  . Marital Status: Married    Spouse Name: N/A    Number of Children: N/A  . Years of Education: N/A   Occupational History  . Not on file.   Social History Main Topics  . Smoking status: Never Smoker   . Smokeless tobacco: Never Used  . Alcohol Use: No  . Drug Use: No  . Sexually Active: Not on file   Other Topics Concern  . Not on file   Social History Narrative  . No narrative on file      PHYSICAL EXAM   BP 160/90  Pulse 90  Ht 5\' 4"  (1.626 m)  Wt 146 lb 8 oz (66.452 kg)  BMI 25.13 kg/m2 Constitutional: She is oriented to person, place, and time. She appears well-developed and well-nourished. No distress.  HENT: No nasal discharge.  Head: Normocephalic and atraumatic.  Eyes: Pupils are equal and round. Right eye exhibits no discharge. Left eye exhibits no discharge.  Neck: Normal range of motion. Neck supple. No JVD present. No thyromegaly present.  Cardiovascular: Normal rate, irregular rhythm, normal heart sounds. Exam reveals no gallop and no  friction rub. No murmur heard.  Pulmonary/Chest: Effort normal and breath sounds normal. No stridor. No respiratory distress. She has no wheezes. She has no rales. She exhibits no tenderness.  Abdominal: Soft. Bowel sounds are normal. She exhibits no distension. There is no tenderness. There is no rebound and no guarding.  Musculoskeletal: Normal range of motion. She exhibits no edema and no tenderness.  Neurological: She is alert and oriented to person, place, and time. Coordination normal.  Skin: Skin is warm and dry. No rash noted. She is not diaphoretic. No erythema. No pallor.  Psychiatric: She has a normal mood and affect. Her behavior is normal. Judgment and thought content normal.    EKG: Atrial fibrillation  -Nonspecific ST changes .  ABNORMAL   ASSESSMENT AND PLAN

## 2012-12-19 NOTE — Assessment & Plan Note (Signed)
Ventricular rate is reasonably controlled and she appears to be asymptomatic. She is on diltiazem extended release 120 mg twice daily. She is tolerating Acticoat relation with Xarelto without reported side effects.

## 2012-12-19 NOTE — Assessment & Plan Note (Signed)
Blood pressure is running high today but that's unusual for her. She has been busy preparing for her grandson wedding. Continue to monitor from now. I will consider increasing the dose of diltiazem in the future.

## 2013-01-08 ENCOUNTER — Other Ambulatory Visit: Payer: Self-pay | Admitting: *Deleted

## 2013-01-09 MED ORDER — LISINOPRIL 40 MG PO TABS: ORAL_TABLET | ORAL | Status: DC

## 2013-01-12 ENCOUNTER — Other Ambulatory Visit: Payer: Self-pay

## 2013-01-12 MED ORDER — RIVAROXABAN 20 MG PO TABS: 20.0000 mg | ORAL_TABLET | Freq: Every day | ORAL | Status: DC

## 2013-01-12 NOTE — Telephone Encounter (Signed)
Refill sent for xarelto  

## 2013-01-18 ENCOUNTER — Other Ambulatory Visit: Payer: Self-pay | Admitting: Internal Medicine

## 2013-02-12 ENCOUNTER — Telehealth: Payer: Self-pay | Admitting: Internal Medicine

## 2013-02-12 DIAGNOSIS — Z789 Other specified health status: Secondary | ICD-10-CM | POA: Insufficient documentation

## 2013-02-12 NOTE — Telephone Encounter (Signed)
Pt states she was prescribed pravastatin 20 mg for cholesterol.  Pt states this caused severe cramping in her legs and she has discontinued the medication.  Pt asking what next step is.  May leave msg on machine or with spouse.

## 2013-02-12 NOTE — Telephone Encounter (Signed)
She can try OTC red yeast rice 600 mg one capsule twice daily.  If that causes cramping, there is nothing else.

## 2013-02-16 NOTE — Telephone Encounter (Signed)
Detailed message left per DPR 

## 2013-03-17 ENCOUNTER — Encounter: Payer: Medicare Other | Admitting: Internal Medicine

## 2013-04-26 ENCOUNTER — Other Ambulatory Visit: Payer: Self-pay | Admitting: Internal Medicine

## 2013-04-29 ENCOUNTER — Encounter (INDEPENDENT_AMBULATORY_CARE_PROVIDER_SITE_OTHER): Payer: Self-pay

## 2013-04-29 ENCOUNTER — Ambulatory Visit (INDEPENDENT_AMBULATORY_CARE_PROVIDER_SITE_OTHER): Payer: Medicare Other | Admitting: Internal Medicine

## 2013-04-29 ENCOUNTER — Encounter: Payer: Self-pay | Admitting: Internal Medicine

## 2013-04-29 ENCOUNTER — Telehealth: Payer: Self-pay | Admitting: Internal Medicine

## 2013-04-29 VITALS — BP 128/64 | HR 62 | Temp 98.1°F | Resp 12 | Ht 63.9 in | Wt 142.5 lb

## 2013-04-29 DIAGNOSIS — I10 Essential (primary) hypertension: Secondary | ICD-10-CM

## 2013-04-29 DIAGNOSIS — E559 Vitamin D deficiency, unspecified: Secondary | ICD-10-CM

## 2013-04-29 DIAGNOSIS — M899 Disorder of bone, unspecified: Secondary | ICD-10-CM

## 2013-04-29 DIAGNOSIS — I4891 Unspecified atrial fibrillation: Secondary | ICD-10-CM

## 2013-04-29 DIAGNOSIS — Z79899 Other long term (current) drug therapy: Secondary | ICD-10-CM

## 2013-04-29 DIAGNOSIS — Z01419 Encounter for gynecological examination (general) (routine) without abnormal findings: Secondary | ICD-10-CM

## 2013-04-29 DIAGNOSIS — M8580 Other specified disorders of bone density and structure, unspecified site: Secondary | ICD-10-CM

## 2013-04-29 DIAGNOSIS — Z1211 Encounter for screening for malignant neoplasm of colon: Secondary | ICD-10-CM

## 2013-04-29 DIAGNOSIS — E785 Hyperlipidemia, unspecified: Secondary | ICD-10-CM

## 2013-04-29 DIAGNOSIS — Z0000 Encounter for general adult medical examination without abnormal findings: Secondary | ICD-10-CM

## 2013-04-29 DIAGNOSIS — Z789 Other specified health status: Secondary | ICD-10-CM

## 2013-04-29 LAB — COMPREHENSIVE METABOLIC PANEL
BUN: 17 mg/dL (ref 6–23)
CO2: 28 mEq/L (ref 19–32)
Calcium: 9.6 mg/dL (ref 8.4–10.5)
Chloride: 105 mEq/L (ref 96–112)
Creatinine, Ser: 0.9 mg/dL (ref 0.4–1.2)
GFR: 62.4 mL/min (ref >60.00)
Glucose, Bld: 99 mg/dL (ref 70–99)

## 2013-04-29 LAB — CBC WITH DIFFERENTIAL/PLATELET
Basophils Absolute: 0 10*3/uL (ref 0.0–0.1)
Eosinophils Relative: 2.4 % (ref 0.0–5.0)
HCT: 41.3 % (ref 36.0–46.0)
Hemoglobin: 13.8 g/dL (ref 12.0–15.0)
Lymphocytes Relative: 28.4 % (ref 12.0–46.0)
Lymphs Abs: 1.7 10*3/uL (ref 0.7–4.0)
Monocytes Relative: 7 % (ref 3.0–12.0)
Neutro Abs: 3.7 10*3/uL (ref 1.4–7.7)
Neutrophils Relative %: 61.8 % (ref 43.0–77.0)
Platelets: 210 10*3/uL (ref 150.0–400.0)
RBC: 4.46 Mil/uL (ref 3.87–5.11)
RDW: 14.7 % — ABNORMAL HIGH (ref 11.5–14.6)
WBC: 5.9 10*3/uL (ref 4.5–10.5)

## 2013-04-29 LAB — LIPID PANEL
Total CHOL/HDL Ratio: 5
VLDL: 26.6 mg/dL (ref 0.0–40.0)

## 2013-04-29 LAB — LDL CHOLESTEROL, DIRECT: Direct LDL: 147.3 mg/dL

## 2013-04-29 MED ORDER — RIVAROXABAN 20 MG PO TABS: 20.0000 mg | ORAL_TABLET | Freq: Every day | ORAL | Status: DC

## 2013-04-29 NOTE — Progress Notes (Signed)
Patient ID: Laurie [name], female   DOB: 03/02/1931, 79 y.o.   MRN: [id number]  The patient is here for annual Medicare wellness examination and management of other chronic and acute problems. Her husband has been in rehabfacility  after hospitalization in oct for PNA and she has been spending every  day with him at [facility] since Oct 27th .  Some anxiety associated with his health issues.  She has been having some occasional leg cramps.  No other complaints.     The risk factors are reflected in the social history.  The roster of all physicians providing medical care to patient - is listed in the Snapshot section of the chart.  Activities of daily living:  The patient is 100% independent in all ADLs: dressing, toileting, feeding as well as independent mobility  Home safety : The patient has smoke detectors in the home. They wear seatbelts.  There are no firearms at home. There is no violence in the home.   There is no risks for hepatitis, STDs or HIV. There is no   history of blood transfusion. They have no travel history to infectious disease endemic areas of the world.  The patient has seen their dentist in the last six month. They have seen their eye doctor in Sept 2014. They have  no hearing difficulty with regard to whispered voices and some television programs.  They have deferred audiologic testing in the last year.  They do not  have excessive sun exposure. Discussed the need for sun protection: hats, long sleeves and use of sunscreen if there is significant sun exposure.   Diet: the importance of a healthy diet is discussed. They do have a healthy diet.  The benefits of regular aerobic exercise were discussed. She walks 4 times per week ,  20 minutes.   Depression screen: there are no signs or vegative symptoms of depression- irritability, change in appetite, anhedonia, sadness/tearfullness.  Cognitive assessment: the patient manages all their financial and personal affairs and  is actively engaged. They could relate day,date,year and events; recalled 2/3 objects at 3 minutes; performed clock-face test normally.  The following portions of the patient's history were reviewed and updated as appropriate: allergies, current medications, past family history, past medical history,  past surgical history, past social history  and problem list.  Visual acuity was not assessed per patient preference since she has regular follow up with her ophthalmologist. Hearing and body mass index were assessed and reviewed.   During the course of the visit the patient was educated and counseled about appropriate screening and preventive services including : fall prevention , diabetes screening, nutrition counseling, colorectal cancer screening, and recommended immunizations.   Patient refused influenza vaccine,  Does not want mammogram  agress to DEXA and FOBT.  Labs were reviewed from July and were all normal .  Objective: General Appearance:    Alert, cooperative, no distress, appears stated age  Head:    Normocephalic, without obvious abnormality, atraumatic  Eyes:    PERRL, conjunctiva/corneas clear, EOM's intact, fundi    benign, both eyes  Ears:    Normal TM's and external ear canals, both ears  Nose:   Nares normal, septum midline, mucosa normal, no drainage    or sinus tenderness  Throat:   Lips, mucosa, and tongue normal; teeth and gums normal  Neck:   Supple, symmetrical, trachea midline, no adenopathy;    thyroid:  no enlargement/tenderness/nodules; no carotid   bruit or JVD  Back:  Symmetric, no curvature, ROM normal, no CVA tenderness  Lungs:     Clear to auscultation bilaterally, respirations unlabored  Chest Wall:    No tenderness or deformity   Heart:    Regular rate and rhythm, S1 and S2 normal, no murmur, rub   or gallop  Breast Exam:    No tenderness, masses, or nipple abnormality  Abdomen:     Soft, non-tender, bowel sounds active all four quadrants,    no  masses, no organomegaly  Genitalia:    Pelvic: cervix normal in appearance, external genitalia normal, no adnexal masses or tenderness, no cervical motion tenderness, rectovaginal septum normal, uterus normal size, shape, and consistency and vagina normal without discharge  Extremities:   Extremities normal, atraumatic, no cyanosis or edema  Pulses:   2+ and symmetric all extremities  Skin:   Skin color, texture, turgor normal, no rashes or lesions  Lymph nodes:   Cervical, supraclavicular, and axillary nodes normal  Neurologic:   CNII-XII intact, normal strength, sensation and reflexes    throughout   Assessment and Plan:  Statin intolerance She has had muscle cramping with low dose pravastatin,  Red yeast rice was recommended as an alternative in mid September.  LDL was 147.   Lab Results  Component Value Date   CHOL 215* 04/29/2013   HDL 46.90 04/29/2013   LDLCALC 96 12/15/2012   LDLDIRECT 147.3 04/29/2013   TRIG 133.0 04/29/2013   CHOLHDL 5 04/29/2013      Screening for colon cancer She prefers to continue annual FOBTs as colon ca screening  Routine general medical examination at a health care facility Annual comprehensive exam was done including breast, and pelvic. All screenings have been addressed .   Hypertension Well controlled on current regimen. Renal function stable, no changes today. Lab Results  Component Value Date   CREATININE 0.9 04/29/2013    Encounter for long-term (current) use of other medications patinet has been transitioned to Xarelto for mitigation of htrombotic risk due to chronic atrial  fibrillation.   Atrial fibrillation chronic managed with diltiazem for rate control. Tolerating it without fluid etention  Other and unspecified hyperlipidemia Her 10 yr risk of having a coronary event is 12% based on current untreated lipid profile.  Given her prior history of statin intolerance. Will recommend red yeast rice or welchol.    Updated Medication  List Outpatient Encounter Prescriptions as of 04/29/2013  Medication Sig  . Calcium-Vitamin D (CALCET PO) Take 2 tablets by mouth daily.   . chlorpheniramine (CHLOR-TRIMETON) 4 MG tablet Take 4 mg by mouth 2 (two) times daily as needed.  . diltiazem (DILACOR XR) 120 MG 24 hr capsule TAKE ONE CAPSULE BY MOUTH TWICE A DAY  . fish oil-omega-3 fatty acids 1000 MG capsule Take 1 g by mouth daily.   Marland Kitchen lisinopril (PRINIVIL,ZESTRIL) 40 MG tablet TAKE 1 TABLET BY MOUTH EVERY DAY  . [DISCONTINUED] Rivaroxaban (XARELTO) 20 MG TABS tablet Take 1 tablet (20 mg total) by mouth daily.  . [DISCONTINUED] levofloxacin (LEVAQUIN) 500 MG tablet Take 1 tablet (500 mg total) by mouth daily.  . [DISCONTINUED] lisinopril (PRINIVIL,ZESTRIL) 40 MG tablet TAKE 1 TABLET BY MOUTH EVERY DAY  . [DISCONTINUED] pravastatin (PRAVACHOL) 20 MG tablet Take 1 tablet (20 mg total) by mouth daily.

## 2013-04-29 NOTE — Progress Notes (Signed)
Pre-visit discussion using our clinic review tool. No additional management support is needed unless otherwise documented below in the visit note.  

## 2013-04-29 NOTE — Telephone Encounter (Signed)
The patient is needing a new prescription for Rivaroxaban (XARELTO) 20 MG TABS  The patient is needing it called into the pharmacy today.

## 2013-04-29 NOTE — Patient Instructions (Signed)
You had your annual Medicare wellness exam today  We will schedule your bone density test soon.  Please use the stool kit to send [place] back a sample to test for blood.  This is your colon CA screening test.   You have refused the influenza vaccine today,  If you change your mind ,  Please return and we will provide you with that vaccine.  We will contact you with the bloodwork results

## 2013-05-01 ENCOUNTER — Encounter: Payer: Self-pay | Admitting: Internal Medicine

## 2013-05-01 DIAGNOSIS — E785 Hyperlipidemia, unspecified: Secondary | ICD-10-CM | POA: Insufficient documentation

## 2013-05-01 NOTE — Assessment & Plan Note (Signed)
Annual comprehensive exam was done including breast, and pelvic. All screenings have been addressed .

## 2013-05-01 NOTE — Assessment & Plan Note (Signed)
chronic managed with diltiazem for rate control. Tolerating it without fluid etention

## 2013-05-01 NOTE — Assessment & Plan Note (Signed)
Her 10 yr risk of having a coronary event is 12% based on current untreated lipid profile.  Given her prior history of statin intolerance. Will recommend red yeast rice or welchol.

## 2013-05-01 NOTE — Assessment & Plan Note (Signed)
Well controlled on current regimen. Renal function stable, no changes today. Lab Results  Component Value Date   CREATININE 0.9 04/29/2013

## 2013-05-01 NOTE — Assessment & Plan Note (Signed)
She prefers to continue annual FOBTs as colon ca screening

## 2013-05-01 NOTE — Assessment & Plan Note (Signed)
patinet has been transitioned to Xarelto for mitigation of htrombotic risk due to chronic atrial  fibrillation.

## 2013-05-01 NOTE — Assessment & Plan Note (Addendum)
She has had muscle cramping with low dose pravastatin,  Red yeast rice was recommended as an alternative in mid September.  LDL was 147.   Lab Results  Component Value Date   CHOL 215* 04/29/2013   HDL 46.90 04/29/2013   LDLCALC 96 12/15/2012   LDLDIRECT 147.3 04/29/2013   TRIG 133.0 04/29/2013   CHOLHDL 5 04/29/2013

## 2013-05-02 ENCOUNTER — Encounter: Payer: Self-pay | Admitting: Internal Medicine

## 2013-05-04 ENCOUNTER — Other Ambulatory Visit: Payer: Self-pay | Admitting: *Deleted

## 2013-05-04 MED ORDER — RIVAROXABAN 20 MG PO TABS: 20.0000 mg | ORAL_TABLET | Freq: Every day | ORAL | Status: DC

## 2013-05-04 NOTE — Telephone Encounter (Signed)
Script sent  

## 2013-05-07 ENCOUNTER — Encounter: Payer: Self-pay | Admitting: *Deleted

## 2013-05-08 ENCOUNTER — Ambulatory Visit (INDEPENDENT_AMBULATORY_CARE_PROVIDER_SITE_OTHER): Payer: Medicare Other | Admitting: *Deleted

## 2013-05-08 DIAGNOSIS — Z1211 Encounter for screening for malignant neoplasm of colon: Secondary | ICD-10-CM

## 2013-05-08 LAB — FECAL OCCULT BLOOD, IMMUNOCHEMICAL: Fecal Occult Bld: POSITIVE

## 2013-05-17 ENCOUNTER — Other Ambulatory Visit: Payer: Self-pay | Admitting: Internal Medicine

## 2013-05-22 ENCOUNTER — Telehealth: Payer: Self-pay | Admitting: *Deleted

## 2013-05-22 NOTE — Telephone Encounter (Signed)
Please call patient re: Laurie [name] wants to if there is a generic? Very expensive

## 2013-05-22 NOTE — Telephone Encounter (Signed)
Patient said she was told that her cost for Xarelto would go down after December but she was concerned about the cost until then. Patient informed that samples were at front to get her through December.

## 2013-05-26 ENCOUNTER — Other Ambulatory Visit: Payer: Medicare Other

## 2013-05-29 ENCOUNTER — Other Ambulatory Visit (INDEPENDENT_AMBULATORY_CARE_PROVIDER_SITE_OTHER): Payer: Medicare Other

## 2013-05-29 ENCOUNTER — Other Ambulatory Visit: Payer: Self-pay | Admitting: Internal Medicine

## 2013-05-29 DIAGNOSIS — Z0000 Encounter for general adult medical examination without abnormal findings: Secondary | ICD-10-CM

## 2013-05-29 DIAGNOSIS — Z1211 Encounter for screening for malignant neoplasm of colon: Secondary | ICD-10-CM

## 2013-05-29 LAB — FECAL OCCULT BLOOD, IMMUNOCHEMICAL: Fecal Occult Bld: NEGATIVE

## 2013-05-29 NOTE — Progress Notes (Signed)
Clydie Braun from [facility] called to request orders be put in for Ifob.

## 2013-06-02 ENCOUNTER — Encounter: Payer: Self-pay | Admitting: *Deleted

## 2013-06-09 ENCOUNTER — Telehealth: Payer: Self-pay

## 2013-06-09 NOTE — Telephone Encounter (Signed)
Samples available for xarelto 20 mg. -

## 2013-06-09 NOTE — Telephone Encounter (Signed)
Pt called and went to get Xarelto refilled, pharmacy states they could not fill it. Please call and advise.

## 2013-06-19 ENCOUNTER — Encounter: Payer: Self-pay | Admitting: Cardiovascular Disease

## 2013-06-19 ENCOUNTER — Ambulatory Visit (INDEPENDENT_AMBULATORY_CARE_PROVIDER_SITE_OTHER): Payer: Medicare Other | Admitting: Cardiovascular Disease

## 2013-06-19 VITALS — BP 145/80 | HR 97 | Ht 64.0 in | Wt 136.2 lb

## 2013-06-19 DIAGNOSIS — I10 Essential (primary) hypertension: Secondary | ICD-10-CM

## 2013-06-19 DIAGNOSIS — I4891 Unspecified atrial fibrillation: Secondary | ICD-10-CM

## 2013-06-19 MED ORDER — RIVAROXABAN 20 MG PO TABS
20.0000 mg | ORAL_TABLET | Freq: Every day | ORAL | Status: DC
Start: 2013-06-19 — End: 2014-06-29

## 2013-06-19 NOTE — Progress Notes (Signed)
HPI  [name] is a pleasant 80 y.o. female who is here today for a followup visit regarding chronic atrial fibrillation. [facility] She had a stress echo done at that time which showed no evidence of ischemia. She is being treated with diltiazem for rate control. Echocardiogram showed normal LV systolic function and mildly dilated left atrium. She was started on anticoagulation with Xarelto.  Overall she has been doing well and denies any chest pain, dyspnea or palpitations.  Allergies  Allergen Reactions  . Hydrochlorothiazide   . Losartan   . Metoprolol   . Triamterene      Current Outpatient Prescriptions on File Prior to Visit  Medication Sig Dispense Refill  . Calcium-Vitamin D (CALCET PO) Take 2 tablets by mouth daily.       . chlorpheniramine (CHLOR-TRIMETON) 4 MG tablet Take 4 mg by mouth 2 (two) times daily as needed.      . diltiazem (DILACOR XR) 120 MG 24 hr capsule TAKE ONE CAPSULE BY MOUTH TWICE A DAY  60 capsule  5  . fish oil-omega-3 fatty acids 1000 MG capsule Take 1 g by mouth daily.       Marland Kitchen. lisinopril (PRINIVIL,ZESTRIL) 40 MG tablet TAKE 1 TABLET BY MOUTH EVERY DAY  30 tablet  5  . Rivaroxaban (XARELTO) 20 MG TABS tablet Take 1 tablet (20 mg total) by mouth daily.  30 tablet  6   No current facility-administered medications on file prior to visit.     Past Medical History  Diagnosis Date  . Hypertension   . Arrhythmia      Past Surgical History  Procedure Laterality Date  . Tonsillectomy       Family History  Problem Relation Age of Onset  . Kidney disease Mother   . Anemia Mother   . Heart disease Father [id number]     History   Social History  . Marital Status: Married    Spouse Name: N/A    Number of Children: N/A  . Years of Education: N/A   Occupational History  . Not on file.   Social History Main Topics  . Smoking status: Never Smoker   . Smokeless tobacco: Never Used  . Alcohol Use: No  . Drug  Use: No  . Sexual Activity: Not on file   Other Topics Concern  . Not on file   Social History Narrative  . No narrative on file      PHYSICAL EXAM   BP 145/80  Pulse 97  Ht 5\' 4"  (1.626 m)  Wt 136 lb 4 oz (61.803 kg)  BMI 23.38 kg/m2 Constitutional: She is oriented to person, place, and time. She appears well-developed and well-nourished. No distress.  HENT: No nasal discharge.  Head: Normocephalic and atraumatic.  Eyes: Pupils are equal and round. Right eye exhibits no discharge. Left eye exhibits no discharge.  Neck: Normal range of motion. Neck supple. No JVD present. No thyromegaly present.  Cardiovascular: Normal rate, irregular rhythm, normal heart sounds. Exam reveals no gallop and no friction rub. No murmur heard.  Pulmonary/Chest: Effort normal and breath sounds normal. No stridor. No respiratory distress. She has no wheezes. She has no rales. She exhibits no tenderness.  Abdominal: Soft. Bowel sounds are normal. She exhibits no distension. There is no tenderness. There is no rebound and no guarding.  Musculoskeletal: Normal range of motion. She exhibits no edema and no tenderness.  Neurological: She is alert and oriented  to person, place, and time. Coordination normal.  Skin: Skin is warm and dry. No rash noted. She is not diaphoretic. No erythema. No pallor.  Psychiatric: She has a normal mood and affect. Her behavior is normal. Judgment and thought content normal.    EKG: Atrial fibrillation  -Nonspecific ST changes .  ABNORMAL   ASSESSMENT AND PLAN

## 2013-06-19 NOTE — Patient Instructions (Signed)
Your physician recommends that you continue on your current medications as directed. Please refer to the Current Medication list given to you today.  Your physician wants you to follow-up in: 6 months. You will receive a reminder letter in the mail two months in advance. If you don't receive a letter, please call our office to schedule the follow-up appointment.   Your Xarelto has been refilled.

## 2013-06-20 ENCOUNTER — Encounter: Payer: Self-pay | Admitting: Cardiovascular Disease

## 2013-06-20 NOTE — Assessment & Plan Note (Signed)
She is overall asymptomatic and continues to do well with rate control. She is tolerating anticoagulation without any reported side effects. She had routine labs done in November which were unremarkable. Fecal occult blood was also negative.

## 2013-06-20 NOTE — Assessment & Plan Note (Signed)
Blood pressure is well controlled on current medications. 

## 2013-10-28 ENCOUNTER — Other Ambulatory Visit: Payer: Self-pay | Admitting: Internal Medicine

## 2013-11-10 ENCOUNTER — Other Ambulatory Visit: Payer: Self-pay | Admitting: *Deleted

## 2013-11-10 MED ORDER — LISINOPRIL 40 MG PO TABS: ORAL_TABLET | ORAL | Status: DC

## 2013-11-11 ENCOUNTER — Other Ambulatory Visit: Payer: Self-pay | Admitting: *Deleted

## 2013-12-04 ENCOUNTER — Other Ambulatory Visit: Payer: Self-pay | Admitting: Internal Medicine

## 2013-12-10 ENCOUNTER — Other Ambulatory Visit: Payer: Self-pay | Admitting: Internal Medicine

## 2013-12-18 ENCOUNTER — Encounter (INDEPENDENT_AMBULATORY_CARE_PROVIDER_SITE_OTHER): Payer: Self-pay

## 2013-12-18 ENCOUNTER — Ambulatory Visit: Payer: Medicare Other | Admitting: Cardiovascular Disease

## 2013-12-18 ENCOUNTER — Encounter: Payer: Self-pay | Admitting: Cardiovascular Disease

## 2013-12-18 ENCOUNTER — Ambulatory Visit (INDEPENDENT_AMBULATORY_CARE_PROVIDER_SITE_OTHER): Payer: Medicare Other | Admitting: Cardiovascular Disease

## 2013-12-18 VITALS — BP 138/70 | HR 85 | Ht 64.0 in | Wt 139.0 lb

## 2013-12-18 DIAGNOSIS — I4891 Unspecified atrial fibrillation: Secondary | ICD-10-CM

## 2013-12-18 DIAGNOSIS — I10 Essential (primary) hypertension: Secondary | ICD-10-CM

## 2013-12-18 NOTE — Progress Notes (Signed)
HPI  [name] is a pleasant 80 y.o. female who is here today for a followup visit regarding chronic atrial fibrillation. [facility] She had a stress echo done at that time which showed no evidence of ischemia. She is being treated with diltiazem for rate control. Echocardiogram showed normal LV systolic function and mildly dilated left atrium. She was started on anticoagulation with Xarelto.  Overall she has been doing well and denies any chest pain, dyspnea or palpitations. Her husband died in January. She has been coping okay so far. She denies any bleeding complications with anticoagulation.  Allergies  Allergen Reactions  . Hydrochlorothiazide   . Losartan   . Metoprolol   . Triamterene      Current Outpatient Prescriptions on File Prior to Visit  Medication Sig Dispense Refill  . Calcium-Vitamin D (CALCET PO) Take 2 tablets by mouth daily.       . chlorpheniramine (CHLOR-TRIMETON) 4 MG tablet Take 4 mg by mouth 2 (two) times daily as needed.      . diltiazem (CARDIZEM CD) 120 MG 24 hr capsule TAKE ONE CAPSULE BY MOUTH TWICE A DAY  60 capsule  0  . fish oil-omega-3 fatty acids 1000 MG capsule Take 1 g by mouth daily.       Marland Kitchen. lisinopril (PRINIVIL,ZESTRIL) 40 MG tablet TAKE 1 TABLET BY MOUTH EVERY DAY  30 tablet  0  . Rivaroxaban (XARELTO) 20 MG TABS tablet Take 1 tablet (20 mg total) by mouth daily.  30 tablet  6  . [DISCONTINUED] diltiazem (DILACOR XR) 120 MG 24 hr capsule TAKE ONE CAPSULE BY MOUTH TWICE A DAY  60 capsule  0   No current facility-administered medications on file prior to visit.     Past Medical History  Diagnosis Date  . Hypertension   . Arrhythmia      Past Surgical History  Procedure Laterality Date  . Tonsillectomy       Family History  Problem Relation Age of Onset  . Kidney disease Mother   . Anemia Mother   . Heart disease Father [id number]     History   Social History  . Marital Status: Married   Spouse Name: N/A    Number of Children: N/A  . Years of Education: N/A   Occupational History  . Not on file.   Social History Main Topics  . Smoking status: Never Smoker   . Smokeless tobacco: Never Used  . Alcohol Use: No  . Drug Use: No  . Sexual Activity: Not on file   Other Topics Concern  . Not on file   Social History Narrative  . No narrative on file      PHYSICAL EXAM   BP 138/70  Pulse 85  Ht 5\' 4"  (1.626 m)  Wt 139 lb (63.05 kg)  BMI 23.85 kg/m2 Constitutional: She is oriented to person, place, and time. She appears well-developed and well-nourished. No distress.  HENT: No nasal discharge.  Head: Normocephalic and atraumatic.  Eyes: Pupils are equal and round. Right eye exhibits no discharge. Left eye exhibits no discharge.  Neck: Normal range of motion. Neck supple. No JVD present. No thyromegaly present.  Cardiovascular: Normal rate, irregular rhythm, normal heart sounds. Exam reveals no gallop and no friction rub. No murmur heard.  Pulmonary/Chest: Effort normal and breath sounds normal. No stridor. No respiratory distress. She has no wheezes. She has no rales. She exhibits no tenderness.  Abdominal: Soft.  Bowel sounds are normal. She exhibits no distension. There is no tenderness. There is no rebound and no guarding.  Musculoskeletal: Normal range of motion. She exhibits no edema and no tenderness.  Neurological: She is alert and oriented to person, place, and time. Coordination normal.  Skin: Skin is warm and dry. No rash noted. She is not diaphoretic. No erythema. No pallor.  Psychiatric: She has a normal mood and affect. Her behavior is normal. Judgment and thought content normal.    EKG: Atrial fibrillation  -Nonspecific ST changes . Left axis deviation  ABNORMAL   ASSESSMENT AND PLAN

## 2013-12-18 NOTE — Assessment & Plan Note (Signed)
Blood pressure is reasonably controlled on current medications. 

## 2013-12-18 NOTE — Patient Instructions (Signed)
Continue same medications.   Your physician wants you to follow-up in: 1 year.  You will receive a reminder letter in the mail two months in advance. If you don't receive a letter, please call our office to schedule the follow-up appointment.  

## 2013-12-18 NOTE — Assessment & Plan Note (Signed)
She is doing well with rate control. No evidence of bleeding complications. I recommend checking CBC and basic metabolic profile twice a year. She has a followup appointment with Dr. Darrick Huntsmanullo in August. I made no changes today.

## 2014-01-04 ENCOUNTER — Other Ambulatory Visit: Payer: Self-pay | Admitting: Internal Medicine

## 2014-01-04 ENCOUNTER — Encounter: Payer: Self-pay | Admitting: Internal Medicine

## 2014-01-04 ENCOUNTER — Ambulatory Visit (INDEPENDENT_AMBULATORY_CARE_PROVIDER_SITE_OTHER): Payer: Medicare Other | Admitting: Internal Medicine

## 2014-01-04 VITALS — BP 144/74 | HR 81 | Temp 98.7°F | Resp 16 | Ht 64.0 in | Wt 140.0 lb

## 2014-01-04 DIAGNOSIS — Z7901 Long term (current) use of anticoagulants: Secondary | ICD-10-CM

## 2014-01-04 DIAGNOSIS — I10 Essential (primary) hypertension: Secondary | ICD-10-CM

## 2014-01-04 DIAGNOSIS — H1132 Conjunctival hemorrhage, left eye: Secondary | ICD-10-CM

## 2014-01-04 DIAGNOSIS — H1130 Conjunctival hemorrhage, unspecified eye: Secondary | ICD-10-CM

## 2014-01-04 DIAGNOSIS — Z79899 Other long term (current) drug therapy: Secondary | ICD-10-CM

## 2014-01-04 DIAGNOSIS — Z1239 Encounter for other screening for malignant neoplasm of breast: Secondary | ICD-10-CM

## 2014-01-04 DIAGNOSIS — E785 Hyperlipidemia, unspecified: Secondary | ICD-10-CM

## 2014-01-04 LAB — LIPID PANEL
CHOL/HDL RATIO: 4
Cholesterol: 178 mg/dL (ref 0–200)
HDL: 46.8 mg/dL (ref >39.00)
LDL CALC: 110 mg/dL — AB (ref 0–99)
NonHDL: 131.2
TRIGLYCERIDES: 107 mg/dL (ref 0.0–149.0)
VLDL: 21.4 mg/dL (ref 0.0–40.0)

## 2014-01-04 LAB — COMPREHENSIVE METABOLIC PANEL
ALK PHOS: 65 U/L (ref 39–117)
ALT: 17 U/L (ref 0–35)
AST: 18 U/L (ref 0–37)
Albumin: 3.5 g/dL (ref 3.5–5.2)
BILIRUBIN TOTAL: 0.9 mg/dL (ref 0.2–1.2)
BUN: 12 mg/dL (ref 6–23)
CO2: 27 mEq/L (ref 19–32)
CREATININE: 0.7 mg/dL (ref 0.4–1.2)
Calcium: 9.7 mg/dL (ref 8.4–10.5)
Chloride: 101 mEq/L (ref 96–112)
GFR: 81.35 mL/min (ref >60.00)
Glucose, Bld: 105 mg/dL — ABNORMAL HIGH (ref 70–99)
Potassium: 4.2 mEq/L (ref 3.5–5.1)
Sodium: 135 mEq/L (ref 135–145)
Total Protein: 7.3 g/dL (ref 6.0–8.3)

## 2014-01-04 LAB — CBC WITH DIFFERENTIAL/PLATELET
Basophils Absolute: 0 10*3/uL (ref 0.0–0.1)
Basophils Relative: 0.3 % (ref 0.0–3.0)
EOS PCT: 1.7 % (ref 0.0–5.0)
Eosinophils Absolute: 0.2 10*3/uL (ref 0.0–0.7)
HEMATOCRIT: 39.2 % (ref 36.0–46.0)
Hemoglobin: 13.1 g/dL (ref 12.0–15.0)
LYMPHS ABS: 1.6 10*3/uL (ref 0.7–4.0)
Lymphocytes Relative: 15.5 % (ref 12.0–46.0)
MCHC: 33.3 g/dL (ref 30.0–36.0)
MCV: 92.4 fl (ref 78.0–100.0)
MONO ABS: 0.7 10*3/uL (ref 0.1–1.0)
Monocytes Relative: 6.9 % (ref 3.0–12.0)
NEUTROS PCT: 75.6 % (ref 43.0–77.0)
Neutro Abs: 7.8 10*3/uL — ABNORMAL HIGH (ref 1.4–7.7)
Platelets: 226 10*3/uL (ref 150.0–400.0)
RBC: 4.24 Mil/uL (ref 3.87–5.11)
RDW: 14 % (ref 11.5–15.5)
WBC: 10.4 10*3/uL (ref 4.0–10.5)

## 2014-01-04 NOTE — Patient Instructions (Signed)
You can try to increase the red yeast rice to 1 tablet twice daily  To lower your cholesterol  Can be taken with food   Mammogram has been ordered   Subconjunctival Hemorrhage A subconjunctival hemorrhage is a bright red patch covering a portion of the white of the eye. The white part of the eye is called the sclera, and it is covered by a thin membrane called the conjunctiva. This membrane is clear, except for tiny blood vessels that you can see with the naked eye. When your eye is irritated or inflamed and becomes red, it is because the vessels in the conjunctiva are swollen. Sometimes, a blood vessel in the conjunctiva can break and bleed. When this occurs, the blood builds up between the conjunctiva and the sclera, and spreads out to create a red area. The red spot may be very small at first. It may then spread to cover a larger part of the surface of the eye, or even all of the visible white part of the eye. In almost all cases, the blood will go away and the eye will become white again. Before completely dissolving, however, the red area may spread. It may also become brownish-yellow in color before going away. If a lot of blood collects under the conjunctiva, it may look like a bulge on the surface of the eye. This looks scary, but it will also eventually flatten out and go away. Subconjunctival hemorrhages do not cause pain, but if swollen, may cause a feeling of irritation. There is no effect on vision.  CAUSES   The most common cause is mild trauma (rubbing the eye, irritation).  Subconjunctival hemorrhages can happen because of coughing or straining (lifting heavy objects), vomiting, or sneezing.  In some cases, your doctor may want to check your blood pressure. High blood pressure can also cause a subconjunctival hemorrhage.  Severe trauma or blunt injuries.  Diseases that affect blood clotting (hemophilia, leukemia).  Abnormalities of blood vessels behind the eye (carotid cavernous  sinus fistula).  Tumors behind the eye.  Certain drugs (aspirin, Coumadin, heparin).  Recent eye surgery. HOME CARE INSTRUCTIONS   Do not worry about the appearance of your eye. You may continue your usual activities.  Often, follow-up is not necessary. SEEK MEDICAL CARE IF:   Your eye becomes painful.  The bleeding does not disappear within 3 weeks.  Bleeding occurs elsewhere, for example, under the skin, in the mouth, or in the other eye.  You have recurring subconjunctival hemorrhages. SEEK IMMEDIATE MEDICAL CARE IF:   Your vision changes or you have difficulty seeing.  You develop a severe headache, persistent vomiting, confusion, or abnormal drowsiness (lethargy).  Your eye seems to bulge or protrude from the eye socket.  You notice the sudden appearance of bruises or have spontaneous bleeding elsewhere on your body. Document Released: 05/21/2005 Document Revised: 10/05/2013 Document Reviewed: 04/18/2009 [facility] Patient Information 2015 [employer] [place]. This information is not intended to replace advice given to you by your health care provider. Make sure you discuss any questions you have with your health care provider.

## 2014-01-04 NOTE — Progress Notes (Signed)
Pre-visit discussion using our clinic review tool. No additional management support is needed unless otherwise documented below in the visit note.  

## 2014-01-04 NOTE — Progress Notes (Signed)
Patient ID: Laurie [name], female   DOB: 18-Nov-1930, 80 y.o.   MRN: [id number]  Patient Active Problem List   Diagnosis Date Noted  . Subconjunctival hemorrhage of left eye 01/04/2014  . Other and unspecified hyperlipidemia 05/01/2013  . Statin intolerance 02/12/2013  . Encounter for long-term (current) use of other medications 03/10/2012  . Screening for colon cancer 03/10/2012  . Routine general medical examination at a health care facility 03/10/2012  . Hypertension 11/05/2011  . Atrial fibrillation 11/05/2011    Subjective:  CC:   Chief Complaint  Patient presents with  . Follow-up    9 months, Husband passed 1/15 stress and anxiety.  . Eye Problem    left eye sclera in left outer corner has reddened area, like a broken vessel.    HPI:   Laurie [name] is a 80 y.o. female who presents for Follow up on chronic medical issues including atrial fibrillation, hyperlipidemia and hypertension.  Acute issues:  Redness of left eye  2) Grief,  Resolving, secondary to husband's death in  January.  She is concerned about recent development of redness od the left eye sclera.  No history of pain, itching insect bite or other trauma.  Takes xarelto for stroke risk mitigation due ot atrial fib.    Grief;  Husband died in January.  Has 4 adult children who are supportive and 2 son live locally and have been assisting her with financial and estate matters.   Emotional support is quite strong,  Has a house full of people currently,  Recovering well from grief,  Sleeping well. No weight loss or melancholy that lasts more than a day or two currently.   Taking medications as directed with no side effects.     Past Medical History  Diagnosis Date  . Hypertension   . Arrhythmia     Past Surgical History  Procedure Laterality Date  . Tonsillectomy         The following portions of the patient's history were reviewed and updated as appropriate: Allergies, current medications, and  problem list.    Review of Systems:   Patient denies headache, fevers, malaise, unintentional weight loss, skin rash, eye pain, sinus congestion and sinus pain, sore throat, dysphagia,  hemoptysis , cough, dyspnea, wheezing, chest pain, palpitations, orthopnea, edema, abdominal pain, nausea, melena, diarrhea, constipation, flank pain, dysuria, hematuria, urinary  Frequency, nocturia, numbness, tingling, seizures,  Focal weakness, Loss of consciousness,  Tremor, insomnia, depression, anxiety, and suicidal ideation.     History   Social History  . Marital Status: Married    Spouse Name: N/A    Number of Children: N/A  . Years of Education: N/A   Occupational History  . Not on file.   Social History Main Topics  . Smoking status: Never Smoker   . Smokeless tobacco: Never Used  . Alcohol Use: No  . Drug Use: No  . Sexual Activity: Not on file   Other Topics Concern  . Not on file   Social History Narrative  . No narrative on file    Objective:  Filed Vitals:   01/04/14 1002  BP: 144/74  Pulse: 81  Temp: 98.7 F (37.1 C)  Resp: 16     General appearance: alert, cooperative and appears stated age Ears: normal TM's and external ear canals both ears Throat: lips, mucosa, and tongue normal; teeth and gums normal Neck: no adenopathy, no carotid bruit, supple, symmetrical, trachea midline and thyroid not enlarged, symmetric, no tenderness/mass/nodules  Back: symmetric, no curvature. ROM normal. No CVA tenderness. Lungs: clear to auscultation bilaterally Heart: regular rate and rhythm, S1, S2 normal, no murmur, click, rub or gallop Abdomen: soft, non-tender; bowel sounds normal; no masses,  no organomegaly Pulses: 2+ and symmetric Skin: Skin color, texture, turgor normal. No rashes or lesions Lymph nodes: Cervical, supraclavicular, and axillary nodes normal.  Assessment and Plan:  Subconjunctival hemorrhage of left eye Nontraumatic. patient takes xarelto for mitigation  of thromboembolic risk due to  atrial fib .  Reassurance provided.  No further workup unless recurrent   Other and unspecified hyperlipidemia Untreated due to statin intolerance.  Taking RYR every other day 600 mg.  Advised to try taking  It bid daily  For goal LDL < 100  Lab Results  Component Value Date   CHOL 178 01/04/2014   HDL 46.80 01/04/2014   LDLCALC 110* 01/04/2014   LDLDIRECT 147.3 04/29/2013   TRIG 107.0 01/04/2014   CHOLHDL 4 01/04/2014    Encounter for long-term (current) use of other medications She is taking an antiplatelet agent and and ACE I and is due for assessment of platelets and renal function/lytes .  All are normal .  Lab Results  Component Value Date   WBC 10.4 01/04/2014   HGB 13.1 01/04/2014   HCT 39.2 01/04/2014   MCV 92.4 01/04/2014   PLT 226.0 01/04/2014   Lab Results  Component Value Date   NA 135 01/04/2014   K 4.2 01/04/2014   CL 101 01/04/2014   CO2 27 01/04/2014   Lab Results  Component Value Date   CREATININE 0.7 01/04/2014     Hypertension Well controlled on current regimen. Renal function stable, no changes today.   Updated Medication List Outpatient Encounter Prescriptions as of 01/04/2014  Medication Sig  . Calcium-Vitamin D (CALCET PO) Take 2 tablets by mouth daily.   . chlorpheniramine (CHLOR-TRIMETON) 4 MG tablet Take 4 mg by mouth 2 (two) times daily as needed.  . fish oil-omega-3 fatty acids 1000 MG capsule Take 1 g by mouth daily.   . Red Yeast Rice Extract (RED YEAST RICE PO) Take by mouth every other day.  . Rivaroxaban (XARELTO) 20 MG TABS tablet Take 1 tablet (20 mg total) by mouth daily.  . [DISCONTINUED] diltiazem (CARDIZEM CD) 120 MG 24 hr capsule TAKE ONE CAPSULE BY MOUTH TWICE A DAY  . [DISCONTINUED] lisinopril (PRINIVIL,ZESTRIL) 40 MG tablet TAKE 1 TABLET BY MOUTH EVERY DAY     Orders Placed This Encounter  Procedures  . MM DIGITAL SCREENING BILATERAL  . Comprehensive metabolic panel  . CBC with Differential  . Lipid panel     No Follow-up on file.

## 2014-01-05 NOTE — Assessment & Plan Note (Signed)
Well controlled on current regimen. Renal function stable, no changes today. 

## 2014-01-05 NOTE — Assessment & Plan Note (Addendum)
Untreated due to statin intolerance.  Taking RYR every other day 600 mg.  Advised to try taking  It bid daily  For goal LDL < 100  Lab Results  Component Value Date   CHOL 178 01/04/2014   HDL 46.80 01/04/2014   LDLCALC 110* 01/04/2014   LDLDIRECT 147.3 04/29/2013   TRIG 107.0 01/04/2014   CHOLHDL 4 01/04/2014

## 2014-01-05 NOTE — Assessment & Plan Note (Addendum)
Nontraumatic. patient takes xarelto for mitigation of thromboembolic risk due to  atrial fib .  Reassurance provided.  No further workup unless recurrent

## 2014-01-05 NOTE — Assessment & Plan Note (Signed)
She is taking an antiplatelet agent and and ACE I and is due for assessment of platelets and renal function/lytes .  All are normal .  Lab Results  Component Value Date   WBC 10.4 01/04/2014   HGB 13.1 01/04/2014   HCT 39.2 01/04/2014   MCV 92.4 01/04/2014   PLT 226.0 01/04/2014   Lab Results  Component Value Date   NA 135 01/04/2014   K 4.2 01/04/2014   CL 101 01/04/2014   CO2 27 01/04/2014   Lab Results  Component Value Date   CREATININE 0.7 01/04/2014

## 2014-01-06 ENCOUNTER — Encounter: Payer: Self-pay | Admitting: *Deleted

## 2014-02-04 ENCOUNTER — Other Ambulatory Visit: Payer: Self-pay | Admitting: Internal Medicine

## 2014-03-03 ENCOUNTER — Other Ambulatory Visit: Payer: Self-pay | Admitting: *Deleted

## 2014-03-03 MED ORDER — DILTIAZEM HCL ER COATED BEADS 120 MG PO CP24: ORAL_CAPSULE | ORAL | Status: DC

## 2014-03-03 MED ORDER — LISINOPRIL 40 MG PO TABS: ORAL_TABLET | ORAL | Status: DC

## 2014-06-29 ENCOUNTER — Other Ambulatory Visit: Payer: Self-pay | Admitting: Cardiovascular Disease

## 2014-07-01 ENCOUNTER — Telehealth: Payer: Self-pay | Admitting: *Deleted

## 2014-07-01 ENCOUNTER — Other Ambulatory Visit (INDEPENDENT_AMBULATORY_CARE_PROVIDER_SITE_OTHER): Payer: Medicare Other

## 2014-07-01 DIAGNOSIS — E559 Vitamin D deficiency, unspecified: Secondary | ICD-10-CM

## 2014-07-01 DIAGNOSIS — E785 Hyperlipidemia, unspecified: Secondary | ICD-10-CM

## 2014-07-01 DIAGNOSIS — I10 Essential (primary) hypertension: Secondary | ICD-10-CM

## 2014-07-01 LAB — LIPID PANEL
CHOL/HDL RATIO: 5
Cholesterol: 251 mg/dL — ABNORMAL HIGH (ref 0–200)
HDL: 48.3 mg/dL (ref >39.00)
NONHDL: 202.7
TRIGLYCERIDES: 274 mg/dL — AB (ref 0.0–149.0)
VLDL: 54.8 mg/dL — ABNORMAL HIGH (ref 0.0–40.0)

## 2014-07-01 LAB — COMPREHENSIVE METABOLIC PANEL
ALK PHOS: 69 U/L (ref 39–117)
ALT: 15 U/L (ref 0–35)
AST: 17 U/L (ref 0–37)
Albumin: 4.2 g/dL (ref 3.5–5.2)
BUN: 17 mg/dL (ref 6–23)
CO2: 28 mEq/L (ref 19–32)
Calcium: 9.5 mg/dL (ref 8.4–10.5)
Chloride: 103 mEq/L (ref 96–112)
Creatinine, Ser: 0.86 mg/dL (ref 0.40–1.20)
GFR: 67.25 mL/min (ref >60.00)
Glucose, Bld: 99 mg/dL (ref 70–99)
Potassium: 4.1 mEq/L (ref 3.5–5.1)
Sodium: 139 mEq/L (ref 135–145)
Total Bilirubin: 0.8 mg/dL (ref 0.2–1.2)
Total Protein: 6.6 g/dL (ref 6.0–8.3)

## 2014-07-01 LAB — VITAMIN D 25 HYDROXY (VIT D DEFICIENCY, FRACTURES): VITD: 43.43 ng/mL (ref 30.00–100.00)

## 2014-07-01 LAB — LDL CHOLESTEROL, DIRECT: Direct LDL: 164 mg/dL

## 2014-07-01 NOTE — Telephone Encounter (Signed)
Labs and dx?  

## 2014-07-07 ENCOUNTER — Ambulatory Visit (INDEPENDENT_AMBULATORY_CARE_PROVIDER_SITE_OTHER): Payer: Medicare Other | Admitting: Internal Medicine

## 2014-07-07 ENCOUNTER — Encounter: Payer: Self-pay | Admitting: Internal Medicine

## 2014-07-07 VITALS — BP 148/82 | HR 65 | Temp 98.4°F | Resp 14 | Ht 64.0 in | Wt 142.5 lb

## 2014-07-07 DIAGNOSIS — I10 Essential (primary) hypertension: Secondary | ICD-10-CM

## 2014-07-07 DIAGNOSIS — Z9849 Cataract extraction status, unspecified eye: Secondary | ICD-10-CM

## 2014-07-07 DIAGNOSIS — E785 Hyperlipidemia, unspecified: Secondary | ICD-10-CM

## 2014-07-07 DIAGNOSIS — Z7901 Long term (current) use of anticoagulants: Secondary | ICD-10-CM

## 2014-07-07 DIAGNOSIS — Z961 Presence of intraocular lens: Secondary | ICD-10-CM

## 2014-07-07 DIAGNOSIS — Z23 Encounter for immunization: Secondary | ICD-10-CM

## 2014-07-07 DIAGNOSIS — I4891 Unspecified atrial fibrillation: Secondary | ICD-10-CM

## 2014-07-07 NOTE — Progress Notes (Signed)
Patient ID: Laurie [name], female   DOB: [birth year], 81 y.o.   MRN: [id number]  Patient Active Problem List   Diagnosis Date Noted  . S/P cataract extraction and insertion of intraocular lens 07/07/2014  . Subconjunctival hemorrhage of left eye 01/04/2014  . Hyperlipidemia 05/01/2013  . Statin intolerance 02/12/2013  . Long term current use of anticoagulant therapy 03/10/2012  . Screening for colon cancer 03/10/2012  . Routine general medical examination at a health care facility 03/10/2012  . Hypertension 11/05/2011  . Atrial fibrillation 11/05/2011    Subjective:  CC:   Chief Complaint  Patient presents with  . Follow-up    general follow up    HPI:   Laurie [name] is a 81 y.o. female who presents for   6 month follow up of hyperlipidemia. Hypertension and grief following the loss of her husband.   She Patient has been taking all medications as directed without experiencing any side effects.  Patient is sleeping well,  Has no specific joint or GI complaints today.   Not exercising regularly or following a diet intended for weight loss.  She did not get the  mammogram that was ordered in August for unclear reasons.  Sates that she was never called.   She is taking red yeast rice every other day due to history of statin intolerance and  I.  She has been measuring her BPs at home and the systolics have been up to 150.  She has a history of multiple antihypertensive  medication intolerances.  Diet reviewed and reveals that she Eats canned soup a lot as well as other prepared foods since she now lives alone  Sleeping well,  Not sad anymore.  had 3 weeks of family visiting for the holidays.    Past Medical History  Diagnosis Date  . Hypertension   . Arrhythmia     Past Surgical History  Procedure Laterality Date  . Tonsillectomy         The following portions of the patient's history were reviewed and updated as appropriate: Allergies, current medications, and problem  list.    Review of Systems:   Patient denies headache, fevers, malaise, unintentional weight loss, skin rash, eye pain, sinus congestion and sinus pain, sore throat, dysphagia,  hemoptysis , cough, dyspnea, wheezing, chest pain, palpitations, orthopnea, edema, abdominal pain, nausea, melena, diarrhea, constipation, flank pain, dysuria, hematuria, urinary  Frequency, nocturia, numbness, tingling, seizures,  Focal weakness, Loss of consciousness,  Tremor, insomnia, depression, anxiety, and suicidal ideation.     History   Social History  . Marital Status: Married    Spouse Name: N/A    Number of Children: N/A  . Years of Education: N/A   Occupational History  . Not on file.   Social History Main Topics  . Smoking status: Never Smoker   . Smokeless tobacco: Never Used  . Alcohol Use: No  . Drug Use: No  . Sexual Activity: Not on file   Other Topics Concern  . Not on file   Social History Narrative    Objective:  Filed Vitals:   07/07/14 1001  BP: 148/82  Pulse: 65  Temp: 98.4 F (36.9 C)  Resp: 14     General appearance: alert, cooperative and appears stated age Ears: normal TM's and external ear canals both ears Throat: lips, mucosa, and tongue normal; teeth and gums normal Neck: no adenopathy, no carotid bruit, supple, symmetrical, trachea midline and thyroid not enlarged, symmetric, no tenderness/mass/nodules Back: symmetric,  no curvature. ROM normal. No CVA tenderness. Lungs: clear to auscultation bilaterally Heart: regular rate and rhythm, S1, S2 normal, no murmur, click, rub or gallop Abdomen: soft, non-tender; bowel sounds normal; no masses,  no organomegaly Pulses: 2+ and symmetric Skin: Skin color, texture, turgor normal. No rashes or lesions Lymph nodes: Cervical, supraclavicular, and axillary nodes normal.  Assessment and Plan:  Hypertension Elevated today.  Reviewed list of meds, patient is not taking OTC meds that could be causing,. It.  Have  asked patient to recheck bp at home a minimum of 5 times over the next 4 weeks and call readings to office for adjustment of medications.  DASH diet recommended.     Hyperlipidemia Managed with RYR due to statin intolerance.Advised to try taking RYR daily. Mediterranean diet described and advised.    Lab Results  Component Value Date   CHOL 251* 07/01/2014   HDL 48.30 07/01/2014   LDLCALC 110* 01/04/2014   LDLDIRECT 164.0 07/01/2014   TRIG 274.0* 07/01/2014   CHOLHDL 5 07/01/2014      Long term current use of anticoagulant therapy CBC was normal  In August and she has had ,o bleeding episodes. Continue Xarelto.  Lab Results  Component Value Date   WBC 10.4 01/04/2014   HGB 13.1 01/04/2014   HCT 39.2 01/04/2014   MCV 92.4 01/04/2014   PLT 226.0 01/04/2014      Atrial fibrillation chronic managed with diltiazem for rate control. Tolerating it without fluid retention.  No changes today     A total of 40 minutes of face to face time was spent with patient more than half of which was spent in counselling and coordination of care    Updated Medication List Outpatient Encounter Prescriptions as of 07/07/2014  Medication Sig  . Calcium-Vitamin D (CALCET PO) Take 2 tablets by mouth daily.   . chlorpheniramine (CHLOR-TRIMETON) 4 MG tablet Take 4 mg by mouth 2 (two) times daily as needed.  . diltiazem (CARDIZEM CD) 120 MG 24 hr capsule TAKE ONE CAPSULE BY MOUTH TWICE A DAY  . fish oil-omega-3 fatty acids 1000 MG capsule Take 1 g by mouth daily.   Marland Kitchen. lisinopril (PRINIVIL,ZESTRIL) 40 MG tablet TAKE 1 TABLET BY MOUTH EVERY DAY  . Red Yeast Rice Extract (RED YEAST RICE PO) Take by mouth every other day.  [name]. XARELTO 20 MG TABS tablet TAKE 1 TABLET (20 MG TOTAL) BY MOUTH DAILY.     Orders Placed This Encounter  Procedures  . Pneumococcal conjugate vaccine 13-valent    Return in about 6 months (around 01/05/2015).

## 2014-07-07 NOTE — Patient Instructions (Signed)
Your cholesterol is still elevated.  If you can tolerate taking the red yeast rice on a daily basis,  This may help .  [facility]  Return in 6 months for your annual wellness exam   A lower sodium diet may help lower your blood pressure  DASH Eating Plan DASH stands for "Dietary Approaches to Stop Hypertension." The DASH eating plan is a healthy eating plan that has been shown to reduce high blood pressure (hypertension). Additional health benefits may include reducing the risk of type 2 diabetes mellitus, heart disease, and stroke. The DASH eating plan may also help with weight loss. WHAT DO I NEED TO KNOW ABOUT THE DASH EATING PLAN? For the DASH eating plan, you will follow these general guidelines:  Choose foods with a percent daily value for sodium of less than 5% (as listed on the food label).  Use salt-free seasonings or herbs instead of table salt or sea salt.  Check with your health care provider or pharmacist before using salt substitutes.  Eat lower-sodium products, often labeled as "lower sodium" or "no salt added."  Eat fresh foods.  Eat more vegetables, fruits, and low-fat dairy products.  Choose whole grains. Look for the word "whole" as the first word in the ingredient list.  Choose fish and skinless chicken or [place] more often than red meat. Limit fish, poultry, and meat to 6 oz (170 g) each day.  Limit sweets, desserts, sugars, and sugary drinks.  Choose heart-healthy fats.  Limit cheese to 1 oz (28 g) per day.  Eat more home-cooked food and less restaurant, buffet, and fast food.  Limit fried foods.  Cook foods using methods other than frying.  Limit canned vegetables. If you do use them, rinse them well to decrease the sodium.  When eating at a restaurant, ask that your food be prepared with less salt, or no salt if possible. WHAT FOODS CAN I EAT? Seek help from a dietitian for individual calorie  needs. Grains Whole grain or whole wheat bread. Brown rice. Whole grain or whole wheat pasta. Quinoa, bulgur, and whole grain cereals. Low-sodium cereals. Corn or whole wheat flour tortillas. Whole grain cornbread. Whole grain crackers. Low-sodium crackers. Vegetables Fresh or frozen vegetables (raw, steamed, roasted, or grilled). Low-sodium or reduced-sodium tomato and vegetable juices. Low-sodium or reduced-sodium tomato sauce and paste. Low-sodium or reduced-sodium canned vegetables.  Fruits All fresh, canned (in natural juice), or frozen fruits. Meat and Other Protein Products Ground beef (85% or leaner), grass-fed beef, or beef trimmed of fat. Skinless chicken or [place]. Ground chicken or [place]key. Pork trimmed of fat. All fish and seafood. Eggs. Dried beans, peas, or lentils. Unsalted nuts and seeds. Unsalted canned beans. Dairy Low-fat dairy products, such as skim or 1% milk, 2% or reduced-fat cheeses, low-fat ricotta or cottage cheese, or plain low-fat yogurt. Low-sodium or reduced-sodium cheeses. Fats and Oils Tub margarines without trans fats. Light or reduced-fat mayonnaise and salad dressings (reduced sodium). Avocado. Safflower, olive, or canola oils. Natural peanut or almond butter. Other Unsalted popcorn and pretzels. The items listed above may not be a complete list of recommended foods or beverages. Contact your dietitian for more options. WHAT FOODS ARE NOT RECOMMENDED? Grains White bread. White pasta. White rice. Refined cornbread. Bagels and croissants. Crackers that contain trans fat. Vegetables Creamed or fried vegetables. Vegetables in a cheese sauce. Regular canned vegetables. Regular canned tomato sauce and paste. Regular tomato and vegetable juices. Fruits Dried fruits. Canned  fruit in light or heavy syrup. Fruit juice. Meat and Other Protein Products Fatty cuts of meat. Ribs, chicken wings, bacon, sausage, bologna, salami, chitterlings, fatback, hot dogs, bratwurst,  and packaged luncheon meats. Salted nuts and seeds. Canned beans with salt. Dairy Whole or 2% milk, cream, half-and-half, and cream cheese. Whole-fat or sweetened yogurt. Full-fat cheeses or blue cheese. Nondairy creamers and whipped toppings. Processed cheese, cheese spreads, or cheese curds. Condiments Onion and garlic salt, seasoned salt, table salt, and sea salt. Canned and packaged gravies. Worcestershire sauce. Tartar sauce. Barbecue sauce. Teriyaki sauce. Soy sauce, including reduced sodium. Steak sauce. Fish sauce. Oyster sauce. Cocktail sauce. Horseradish. Ketchup and mustard. Meat flavorings and tenderizers. Bouillon cubes. Hot sauce. Tabasco sauce. Marinades. Taco seasonings. Relishes. Fats and Oils Butter, stick margarine, lard, shortening, ghee, and bacon fat. Coconut, palm kernel, or palm oils. Regular salad dressings. Other Pickles and olives. Salted popcorn and pretzels. The items listed above may not be a complete list of foods and beverages to avoid. Contact your dietitian for more information. WHERE CAN I FIND MORE INFORMATION? National Heart, Lung, and Blood Institute: travelstabloid.com Document Released: 05/10/2011 Document Revised: 10/05/2013 Document Reviewed: 03/25/2013 [facility] Patient Information 2015 [place], [place]. This information is not intended to replace advice given to you by your health care provider. Make sure you discuss any questions you have with your health care provider.

## 2014-07-07 NOTE — Progress Notes (Signed)
Pre-visit discussion using our clinic review tool. No additional management support is needed unless otherwise documented below in the visit note.  

## 2014-07-08 ENCOUNTER — Telehealth: Payer: Self-pay | Admitting: Internal Medicine

## 2014-07-08 NOTE — Telephone Encounter (Signed)
emmi mailed  °

## 2014-07-09 ENCOUNTER — Encounter: Payer: Self-pay | Admitting: Internal Medicine

## 2014-07-09 NOTE — Assessment & Plan Note (Signed)
Elevated today.  Reviewed list of meds, patient is not taking OTC meds that could be causing,. It.  Have asked patient to recheck bp at home a minimum of 5 times over the next 4 weeks and call readings to office for adjustment of medications.  DASH diet recommended.

## 2014-07-09 NOTE — Assessment & Plan Note (Signed)
CBC was normal  In August and she has had ,o bleeding episodes. Continue Xarelto.  Lab Results  Component Value Date   WBC 10.4 01/04/2014   HGB 13.1 01/04/2014   HCT 39.2 01/04/2014   MCV 92.4 01/04/2014   PLT 226.0 01/04/2014

## 2014-07-09 NOTE — Assessment & Plan Note (Signed)
Managed with RYR due to statin intolerance.Advised to try taking RYR daily. Mediterranean diet described and advised.    Lab Results  Component Value Date   CHOL 251* 07/01/2014   HDL 48.30 07/01/2014   LDLCALC 110* 01/04/2014   LDLDIRECT 164.0 07/01/2014   TRIG 274.0* 07/01/2014   CHOLHDL 5 07/01/2014

## 2014-07-09 NOTE — Assessment & Plan Note (Signed)
chronic managed with diltiazem for rate control. Tolerating it without fluid retention.  No changes today

## 2014-07-23 ENCOUNTER — Telehealth: Payer: Self-pay | Admitting: Internal Medicine

## 2014-07-23 DIAGNOSIS — I10 Essential (primary) hypertension: Secondary | ICD-10-CM

## 2014-07-23 NOTE — Telephone Encounter (Signed)
Left message for patient to return call to office. 

## 2014-07-23 NOTE — Telephone Encounter (Signed)
Patient notified

## 2014-07-23 NOTE — Telephone Encounter (Signed)
They are all excellent.  No changes are required to medication

## 2014-07-23 NOTE — Telephone Encounter (Signed)
Patient called with blood pressure readings requested by MD.  07/08/14   BP reading 129/79 07/16/14  BP reading  126/87 07/17/14 BP reading 123/74 07/18/14 BP reading 133/77 07/20/14 BP reading 117/75  Patient did not record pulse rate.

## 2014-07-26 ENCOUNTER — Ambulatory Visit: Payer: Self-pay | Admitting: Internal Medicine

## 2014-08-28 ENCOUNTER — Other Ambulatory Visit: Payer: Self-pay | Admitting: Internal Medicine

## 2014-10-04 ENCOUNTER — Telehealth: Payer: Self-pay | Admitting: *Deleted

## 2014-10-04 NOTE — Telephone Encounter (Signed)
Spoke w/ pt.  She reports that she tripped over a curb last week and bruised her knee.   She reports a knot that is getting better.   Describes the color as a reddish color that seems to be lightening up. She states that she is walking fine, still has a mild bit of pain that she has been taking Tylenol for.  She reports that overall it seems to be healing, she was hoping for a cream that will speed the healing process.  Advised her that there is no cream to apply to this, to continue to monitor and if sx worsen to call us or contact her PCP.  She is agreeable and will call back w/ any further questions or concerns.

## 2014-10-04 NOTE — Telephone Encounter (Signed)
Pt stating that she is on xarelto and about a week ago she fell on her knee and now there 's a knot She wants to know what can she do, any creams or any medication for this.  Please advise.

## 2014-12-13 ENCOUNTER — Other Ambulatory Visit: Payer: Self-pay

## 2014-12-13 MED ORDER — RIVAROXABAN 20 MG PO TABS
ORAL_TABLET | ORAL | Status: DC
Start: 2014-12-13 — End: 2015-07-05

## 2014-12-13 NOTE — Telephone Encounter (Signed)
Refill sent for xarelto  

## 2014-12-30 ENCOUNTER — Ambulatory Visit (INDEPENDENT_AMBULATORY_CARE_PROVIDER_SITE_OTHER): Payer: Medicare Other | Admitting: Cardiovascular Disease

## 2014-12-30 ENCOUNTER — Encounter: Payer: Self-pay | Admitting: Cardiovascular Disease

## 2014-12-30 VITALS — BP 140/72 | HR 81 | Ht 64.0 in | Wt 142.5 lb

## 2014-12-30 DIAGNOSIS — I10 Essential (primary) hypertension: Secondary | ICD-10-CM | POA: Diagnosis not present

## 2014-12-30 DIAGNOSIS — I4891 Unspecified atrial fibrillation: Secondary | ICD-10-CM | POA: Diagnosis not present

## 2014-12-30 NOTE — Assessment & Plan Note (Signed)
Blood pressure is reasonably controlled on current medications. The patient has a follow-up early exam scheduled with Dr.Tullo next week and will likely have routine labs performed.

## 2014-12-30 NOTE — Progress Notes (Signed)
HPI  Ms. Lienemann is a pleasant 81 y.o. female who is here today for a followup visit regarding chronic atrial fibrillation. This was initially diagnosed in 2012 at [facility]. She had a stress echo done at that time which showed no evidence of ischemia. She is being treated with diltiazem for rate control. Echocardiogram showed normal LV systolic function and mildly dilated left atrium. She is on anticoagulation with Xarelto.  Overall she has been doing well and denies any chest pain, dyspnea or palpitations. She lost her husband of 62 years in January 2015. She has been doing reasonably well and continues to be active physically and socially.  Allergies  Allergen Reactions  . Hydrochlorothiazide   . Losartan   . Metoprolol   . Triamterene      Current Outpatient Prescriptions on File Prior to Visit  Medication Sig Dispense Refill  . Calcium-Vitamin D (CALCET PO) Take 2 tablets by mouth daily.     . chlorpheniramine (CHLOR-TRIMETON) 4 MG tablet Take 4 mg by mouth 2 (two) times daily as needed.    . diltiazem (CARDIZEM CD) 120 MG 24 hr capsule TAKE ONE CAPSULE BY MOUTH TWICE A DAY 180 capsule 1  . fish oil-omega-3 fatty acids 1000 MG capsule Take 1 g by mouth daily.     Marland Kitchen lisinopril (PRINIVIL,ZESTRIL) 40 MG tablet TAKE 1 TABLET BY MOUTH EVERY DAY 90 tablet 1  . rivaroxaban (XARELTO) 20 MG TABS tablet TAKE 1 TABLET (20 MG TOTAL) BY MOUTH DAILY. 30 tablet 5  . Red Yeast Rice Extract (RED YEAST RICE PO) Take by mouth every other day.    . [DISCONTINUED] diltiazem (DILACOR XR) 120 MG 24 hr capsule TAKE ONE CAPSULE BY MOUTH TWICE A DAY 60 capsule 0   No current facility-administered medications on file prior to visit.     Past Medical History  Diagnosis Date  . Hypertension   . Arrhythmia      Past Surgical History  Procedure Laterality Date  . Tonsillectomy       Family History  Problem Relation Age of Onset  . Kidney disease Mother   . Anemia Mother   . Heart disease Father  43     History   Social History  . Marital Status: Married    Spouse Name: N/A  . Number of Children: N/A  . Years of Education: N/A   Occupational History  . Not on file.   Social History Main Topics  . Smoking status: Never Smoker   . Smokeless tobacco: Never Used  . Alcohol Use: No  . Drug Use: No  . Sexual Activity: Not on file   Other Topics Concern  . Not on file   Social History Narrative      PHYSICAL EXAM   BP 140/72 mmHg  Pulse 81  Ht  (1.626 m)  Wt 142 lb 8 oz (64.638 kg)  BMI 24.45 kg/m2 Constitutional: She is oriented to person, place, and time. She appears well-developed and well-nourished. No distress.  HENT: No nasal discharge.  Head: Normocephalic and atraumatic.  Eyes: Pupils are equal and round. Right eye exhibits no discharge. Left eye exhibits no discharge.  Neck: Normal range of motion. Neck supple. No JVD present. No thyromegaly present.  Cardiovascular: Normal rate, irregular rhythm, normal heart sounds. Exam reveals no gallop and no friction rub. No murmur heard.  Pulmonary/Chest: Effort normal and breath sounds normal. No stridor. No respiratory distress. She has no wheezes. She has no rales. She  exhibits no tenderness.  Abdominal: Soft. Bowel sounds are normal. She exhibits no distension. There is no tenderness. There is no rebound and no guarding.  Musculoskeletal: Normal range of motion. She exhibits no edema and no tenderness.  Neurological: She is alert and oriented to person, place, and time. Coordination normal.  Skin: Skin is warm and dry. No rash noted. She is not diaphoretic. No erythema. No pallor.  Psychiatric: She has a normal mood and affect. Her behavior is normal. Judgment and thought content normal.    EKG: Atrial fibrillation  -Nonspecific ST changes . Left axis deviation  ABNORMAL   ASSESSMENT AND PLAN

## 2014-12-30 NOTE — Assessment & Plan Note (Signed)
She is doing very well with rate control and is overall asymptomatic. She is tolerating anticoagulation with no side effects. I made no changes to her medications.

## 2014-12-30 NOTE — Patient Instructions (Signed)
Medication Instructions: Continue same medications.   Labwork: None.   Procedures/Testing: None.   Follow-Up: 1 year with Dr. Dashley Monts  Any Additional Special Instructions Will Be Listed Below (If Applicable).   

## 2015-01-06 ENCOUNTER — Encounter: Payer: Self-pay | Admitting: Internal Medicine

## 2015-01-06 ENCOUNTER — Ambulatory Visit (INDEPENDENT_AMBULATORY_CARE_PROVIDER_SITE_OTHER): Payer: Medicare Other | Admitting: Internal Medicine

## 2015-01-06 VITALS — BP 144/80 | HR 50 | Temp 97.7°F | Resp 12 | Ht 63.75 in | Wt 142.8 lb

## 2015-01-06 DIAGNOSIS — I10 Essential (primary) hypertension: Secondary | ICD-10-CM | POA: Diagnosis not present

## 2015-01-06 DIAGNOSIS — Z889 Allergy status to unspecified drugs, medicaments and biological substances status: Secondary | ICD-10-CM

## 2015-01-06 DIAGNOSIS — I4819 Other persistent atrial fibrillation: Secondary | ICD-10-CM

## 2015-01-06 DIAGNOSIS — E559 Vitamin D deficiency, unspecified: Secondary | ICD-10-CM | POA: Diagnosis not present

## 2015-01-06 DIAGNOSIS — I481 Persistent atrial fibrillation: Secondary | ICD-10-CM

## 2015-01-06 DIAGNOSIS — Z1159 Encounter for screening for other viral diseases: Secondary | ICD-10-CM

## 2015-01-06 DIAGNOSIS — Z7901 Long term (current) use of anticoagulants: Secondary | ICD-10-CM | POA: Diagnosis not present

## 2015-01-06 DIAGNOSIS — E785 Hyperlipidemia, unspecified: Secondary | ICD-10-CM | POA: Diagnosis not present

## 2015-01-06 DIAGNOSIS — Z0000 Encounter for general adult medical examination without abnormal findings: Secondary | ICD-10-CM

## 2015-01-06 DIAGNOSIS — Z789 Other specified health status: Secondary | ICD-10-CM

## 2015-01-06 LAB — CBC WITH DIFFERENTIAL/PLATELET
BASOS PCT: 0.6 % (ref 0.0–3.0)
Basophils Absolute: 0 10*3/uL (ref 0.0–0.1)
EOS ABS: 0.2 10*3/uL (ref 0.0–0.7)
Eosinophils Relative: 3.7 % (ref 0.0–5.0)
HCT: 42 % (ref 36.0–46.0)
HEMOGLOBIN: 14.1 g/dL (ref 12.0–15.0)
LYMPHS ABS: 1.7 10*3/uL (ref 0.7–4.0)
LYMPHS PCT: 31.9 % (ref 12.0–46.0)
MCHC: 33.6 g/dL (ref 30.0–36.0)
MCV: 91.9 fl (ref 78.0–100.0)
MONO ABS: 0.3 10*3/uL (ref 0.1–1.0)
Monocytes Relative: 5.5 % (ref 3.0–12.0)
NEUTROS ABS: 3.1 10*3/uL (ref 1.4–7.7)
Neutrophils Relative %: 58.3 % (ref 43.0–77.0)
Platelets: 188 10*3/uL (ref 150.0–400.0)
RBC: 4.57 Mil/uL (ref 3.87–5.11)
RDW: 14.1 % (ref 11.5–15.5)
WBC: 5.3 10*3/uL (ref 4.0–10.5)

## 2015-01-06 LAB — COMPREHENSIVE METABOLIC PANEL
ALT: 11 U/L (ref 0–35)
AST: 13 U/L (ref 0–37)
Albumin: 4.2 g/dL (ref 3.5–5.2)
Alkaline Phosphatase: 63 U/L (ref 39–117)
BILIRUBIN TOTAL: 0.7 mg/dL (ref 0.2–1.2)
BUN: 16 mg/dL (ref 6–23)
CO2: 29 mEq/L (ref 19–32)
Calcium: 9.8 mg/dL (ref 8.4–10.5)
Chloride: 104 mEq/L (ref 96–112)
Creatinine, Ser: 0.8 mg/dL (ref 0.40–1.20)
GFR: 73.01 mL/min (ref >60.00)
Glucose, Bld: 103 mg/dL — ABNORMAL HIGH (ref 70–99)
Potassium: 5 mEq/L (ref 3.5–5.1)
SODIUM: 139 meq/L (ref 135–145)
TOTAL PROTEIN: 6.6 g/dL (ref 6.0–8.3)

## 2015-01-06 LAB — TSH: TSH: 1.57 u[IU]/mL (ref 0.35–4.50)

## 2015-01-06 LAB — LIPID PANEL
CHOL/HDL RATIO: 5
Cholesterol: 218 mg/dL — ABNORMAL HIGH (ref 0–200)
HDL: 48.2 mg/dL (ref >39.00)
LDL Cholesterol: 139 mg/dL — ABNORMAL HIGH (ref 0–99)
NonHDL: 169.4
TRIGLYCERIDES: 152 mg/dL — AB (ref 0.0–149.0)
VLDL: 30.4 mg/dL (ref 0.0–40.0)

## 2015-01-06 LAB — VITAMIN D 25 HYDROXY (VIT D DEFICIENCY, FRACTURES): VITD: 40.25 ng/mL (ref 30.00–100.00)

## 2015-01-06 NOTE — Patient Instructions (Signed)
It was good to see you today!  You are doing well!  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  Marland KitchenHealth Maintenance Adopting a healthy lifestyle and getting preventive care can go a long way to promote health and wellness. Talk with your health care provider about what schedule of regular examinations is right for you. This is a good chance for you to check in with your provider about disease prevention and staying healthy. In between checkups, there are plenty of things you can do on your own. Experts have done a lot of research about which lifestyle changes and preventive measures are most likely to keep you healthy. Ask your health care provider for more information. WEIGHT AND DIET  Eat a healthy diet  Be sure to include plenty of vegetables, fruits, low-fat dairy products, and lean protein.  Do not eat a lot of foods high in solid fats, added sugars, or salt.  Get regular exercise. This is one of the most important things you can do for your health.  Most adults should exercise for at least 150 minutes each week. The exercise should increase your heart rate and make you sweat (moderate-intensity exercise).  Most adults should also do strengthening exercises at least twice a week. This is in addition to the moderate-intensity exercise.  Maintain a healthy weight  Body mass index (BMI) is a measurement that can be used to identify possible weight problems. It estimates body fat based on height and weight. Your health care provider can help determine your BMI and help you achieve or maintain a healthy weight.  For females 75 years of age and older:   A BMI below 18.5 is considered underweight.  A BMI of 18.5 to 24.9 is normal.  A BMI of 25 to 29.9 is considered overweight.  A BMI of 30 and above is considered obese.  Watch levels of cholesterol and blood lipids  You should start having your  blood tested for lipids and cholesterol at 79 years of age, then have this test every 5 years.  You may need to have your cholesterol levels checked more often if:  Your lipid or cholesterol levels are high.  You are older than 79 years of age.  You are at high risk for heart disease.  CANCER SCREENING   Lung Cancer  Lung cancer screening is recommended for adults 75-81 years old who are at high risk for lung cancer because of a history of smoking.  A yearly low-dose CT scan of the lungs is recommended for people who:  Currently smoke.  Have quit within the past 15 years.  Have at least a 30-pack-year history of smoking. A pack year is smoking an average of one pack of cigarettes a day for 1 year.  Yearly screening should continue until it has been 15 years since you quit.  Yearly screening should stop if you develop a health problem that would prevent you from having lung cancer treatment.  Breast Cancer  Practice breast self-awareness. This means understanding how your breasts normally appear and feel.  It also means doing regular breast self-exams. Let your health care provider know about any changes, no matter how small.  If you are in your 20s or 30s, you should have a clinical breast exam (CBE) by a health care provider every 1-3 years as part of a regular health exam.  If you are 40 or older,  have a CBE every year. Also consider having a breast X-ray (mammogram) every year.  If you have a family history of breast cancer, talk to your health care provider about genetic screening.  If you are at high risk for breast cancer, talk to your health care provider about having an MRI and a mammogram every year.  Breast cancer gene (BRCA) assessment is recommended for women who have family members with BRCA-related cancers. BRCA-related cancers include:  Breast.  Ovarian.  Tubal.  Peritoneal cancers.  Results of the assessment will determine the need for genetic  counseling and BRCA1 and BRCA2 testing. Cervical Cancer Routine pelvic examinations to screen for cervical cancer are no longer recommended for nonpregnant women who are considered low risk for cancer of the pelvic organs (ovaries, uterus, and vagina) and who do not have symptoms. A pelvic examination may be necessary if you have symptoms including those associated with pelvic infections. Ask your health care provider if a screening pelvic exam is right for you.   The Pap test is the screening test for cervical cancer for women who are considered at risk.  If you had a hysterectomy for a problem that was not cancer or a condition that could lead to cancer, then you no longer need Pap tests.  If you are older than 65 years, and you have had normal Pap tests for the past 10 years, you no longer need to have Pap tests.  If you have had past treatment for cervical cancer or a condition that could lead to cancer, you need Pap tests and screening for cancer for at least 20 years after your treatment.  If you no longer get a Pap test, assess your risk factors if they change (such as having a new sexual partner). This can affect whether you should start being screened again.  Some women have medical problems that increase their chance of getting cervical cancer. If this is the case for you, your health care provider may recommend more frequent screening and Pap tests.  The human papillomavirus (HPV) test is another test that may be used for cervical cancer screening. The HPV test looks for the virus that can cause cell changes in the cervix. The cells collected during the Pap test can be tested for HPV.  The HPV test can be used to screen women 57 years of age and older. Getting tested for HPV can extend the interval between normal Pap tests from three to five years.  An HPV test also should be used to screen women of any age who have unclear Pap test results.  After 79 years of age, women should have  HPV testing as often as Pap tests.  Colorectal Cancer  This type of cancer can be detected and often prevented.  Routine colorectal cancer screening usually begins at 79 years of age and continues through 79 years of age.  Your health care provider may recommend screening at an earlier age if you have risk factors for colon cancer.  Your health care provider may also recommend using home test kits to check for hidden blood in the stool.  A small camera at the end of a tube can be used to examine your colon directly (sigmoidoscopy or colonoscopy). This is done to check for the earliest forms of colorectal cancer.  Routine screening usually begins at age 106.  Direct examination of the colon should be repeated every 5-10 years through 79 years of age. However, you may need to be screened  more often if early forms of precancerous polyps or small growths are found. Skin Cancer  Check your skin from head to toe regularly.  Tell your health care provider about any new moles or changes in moles, especially if there is a change in a mole's shape or color.  Also tell your health care provider if you have a mole that is larger than the size of a pencil eraser.  Always use sunscreen. Apply sunscreen liberally and repeatedly throughout the day.  Protect yourself by wearing long sleeves, pants, a wide-brimmed hat, and sunglasses whenever you are outside. HEART DISEASE, DIABETES, AND HIGH BLOOD PRESSURE   Have your blood pressure checked at least every 1-2 years. High blood pressure causes heart disease and increases the risk of stroke.  If you are between 63 years and 65 years old, ask your health care provider if you should take aspirin to prevent strokes.  Have regular diabetes screenings. This involves taking a blood sample to check your fasting blood sugar level.  If you are at a normal weight and have a low risk for diabetes, have this test once every three years after 79 years of  age.  If you are overweight and have a high risk for diabetes, consider being tested at a younger age or more often. PREVENTING INFECTION  Hepatitis B  If you have a higher risk for hepatitis B, you should be screened for this virus. You are considered at high risk for hepatitis B if:  You were born in a country where hepatitis B is common. Ask your health care provider which countries are considered high risk.  Your parents were born in a high-risk country, and you have not been immunized against hepatitis B (hepatitis B vaccine).  You have HIV or AIDS.  You use needles to inject street drugs.  You live with someone who has hepatitis B.  You have had sex with someone who has hepatitis B.  You get hemodialysis treatment.  You take certain medicines for conditions, including cancer, organ transplantation, and autoimmune conditions. Hepatitis C  Blood testing is recommended for:  Everyone born from [birth year] through 1963.  Anyone with known risk factors for hepatitis C. Sexually transmitted infections (STIs)  You should be screened for sexually transmitted infections (STIs) including gonorrhea and chlamydia if:  You are sexually active and are younger than 79 years of age.  You are older than 79 years of age and your health care provider tells you that you are at risk for this type of infection.  Your sexual activity has changed since you were last screened and you are at an increased risk for chlamydia or gonorrhea. Ask your health care provider if you are at risk.  If you do not have HIV, but are at risk, it may be recommended that you take a prescription medicine daily to prevent HIV infection. This is called pre-exposure prophylaxis (PrEP). You are considered at risk if:  You are sexually active and do not regularly use condoms or know the HIV status of your partner(s).  You take drugs by injection.  You are sexually active with a partner who has HIV. Talk with your health  care provider about whether you are at high risk of being infected with HIV. If you choose to begin PrEP, you should first be tested for HIV. You should then be tested every 3 months for as long as you are taking PrEP.  PREGNANCY   If you are premenopausal and you may  become pregnant, ask your health care provider about preconception counseling.  If you may become pregnant, take 400 to 800 micrograms (mcg) of folic acid every day.  If you want to prevent pregnancy, talk to your health care provider about birth control (contraception). OSTEOPOROSIS AND MENOPAUSE   Osteoporosis is a disease in which the bones lose minerals and strength with aging. This can result in serious bone fractures. Your risk for osteoporosis can be identified using a bone density scan.  If you are 16 years of age or older, or if you are at risk for osteoporosis and fractures, ask your health care provider if you should be screened.  Ask your health care provider whether you should take a calcium or vitamin D supplement to lower your risk for osteoporosis.  Menopause may have certain physical symptoms and risks.  Hormone replacement therapy may reduce some of these symptoms and risks. Talk to your health care provider about whether hormone replacement therapy is right for you.  HOME CARE INSTRUCTIONS   Schedule regular health, dental, and eye exams.  Stay current with your immunizations.   Do not use any tobacco products including cigarettes, chewing tobacco, or electronic cigarettes.  If you are pregnant, do not drink alcohol.  If you are breastfeeding, limit how much and how often you drink alcohol.  Limit alcohol intake to no more than 1 drink per day for nonpregnant women. One drink equals 12 ounces of beer, 5 ounces of wine, or 1 ounces of hard liquor.  Do not use street drugs.  Do not share needles.  Ask your health care provider for help if you need support or information about quitting  drugs.  Tell your health care provider if you often feel depressed.  Tell your health care provider if you have ever been abused or do not feel safe at home. Document Released: 12/04/2010 Document Revised: 10/05/2013 Document Reviewed: 04/22/2013 [facility] Patient Information 2015 [employer] [place], [place]. This information is not intended to replace advice given to you by your health care provider. Make sure you discuss any questions you have with your health care provider.
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
## 2015-01-06 NOTE — Progress Notes (Signed)
Pre-visit discussion using our clinic review tool. No additional management support is needed unless otherwise documented below in the visit note.  

## 2015-01-07 LAB — HEPATITIS C ANTIBODY: HCV Ab: NEGATIVE

## 2015-01-09 NOTE — Assessment & Plan Note (Signed)
Well controlled on current regimen. Renal function stable, no changes today.  Lab Results  Component Value Date   CREATININE 0.80 01/06/2015   Lab Results  Component Value Date   NA 139 01/06/2015   K 5.0 01/06/2015   CL 104 01/06/2015   CO2 29 01/06/2015

## 2015-01-09 NOTE — Assessment & Plan Note (Signed)

## 2015-01-09 NOTE — Assessment & Plan Note (Signed)
chronic managed with diltiazem for rate control. Tolerating it without fluid retention

## 2015-01-09 NOTE — Assessment & Plan Note (Signed)
Untreated due to satin intolerance even to RYR.  Lab Results  Component Value Date   CHOL 218* 01/06/2015   HDL 48.20 01/06/2015   LDLCALC 139* 01/06/2015   LDLDIRECT 164.0 07/01/2014   TRIG 152.0* 01/06/2015   CHOLHDL 5 01/06/2015    Lab Results  Component Value Date   ALT 11 01/06/2015   AST 13 01/06/2015   ALKPHOS 63 01/06/2015   BILITOT 0.7 01/06/2015

## 2015-01-09 NOTE — Assessment & Plan Note (Signed)
She did not tolerate even RYR without myalgias.

## 2015-02-21 ENCOUNTER — Other Ambulatory Visit: Payer: Self-pay | Admitting: Internal Medicine

## 2015-02-28 ENCOUNTER — Other Ambulatory Visit: Payer: Self-pay | Admitting: Internal Medicine

## 2015-03-29 ENCOUNTER — Telehealth: Payer: Self-pay | Admitting: *Deleted

## 2015-03-29 NOTE — Telephone Encounter (Signed)
called pt to inform her she still has refills on this medication. she verbalized understanding.

## 2015-03-29 NOTE — Telephone Encounter (Signed)
Patient calling the office for samples of medication:   1.  What medication and dosage are you requesting samples for? Xarelto 20mg   2.  Are you currently out of this medication? yes  3. Are you requesting samples to get you through until you receive your prescription? no

## 2015-06-03 ENCOUNTER — Ambulatory Visit (INDEPENDENT_AMBULATORY_CARE_PROVIDER_SITE_OTHER): Payer: Medicare Other | Admitting: Family Medicine

## 2015-06-03 ENCOUNTER — Encounter: Payer: Self-pay | Admitting: Family Medicine

## 2015-06-03 VITALS — BP 132/66 | HR 80 | Temp 98.1°F | Ht 63.75 in | Wt 151.0 lb

## 2015-06-03 DIAGNOSIS — R350 Frequency of micturition: Secondary | ICD-10-CM

## 2015-06-03 DIAGNOSIS — R300 Dysuria: Secondary | ICD-10-CM | POA: Insufficient documentation

## 2015-06-03 DIAGNOSIS — N3001 Acute cystitis with hematuria: Secondary | ICD-10-CM | POA: Diagnosis not present

## 2015-06-03 DIAGNOSIS — N3090 Cystitis, unspecified without hematuria: Secondary | ICD-10-CM | POA: Insufficient documentation

## 2015-06-03 LAB — URINALYSIS, MICROSCOPIC ONLY

## 2015-06-03 LAB — POCT URINALYSIS DIPSTICK
Bilirubin, UA: NEGATIVE
GLUCOSE UA: NEGATIVE
Ketones, UA: NEGATIVE
NITRITE UA: NEGATIVE
PH UA: 5.5
SPEC GRAV UA: 1.01
Urobilinogen, UA: 0.2

## 2015-06-03 MED ORDER — CEPHALEXIN 500 MG PO CAPS: 500.0000 mg | ORAL_CAPSULE | Freq: Two times a day (BID) | ORAL | Status: AC

## 2015-06-03 NOTE — Addendum Note (Signed)
Addended by: Dennie BibleAVIS, KATHY R on: 06/03/2015 01:40 PM   Modules accepted: Kipp BroodSmartSet

## 2015-06-03 NOTE — Progress Notes (Signed)
Patient ID: Laurie [name], female   DOB: May 19, 1931, 81 y.o.   MRN: [id number]  Laurie AlarEric Lynelle Weiler, MD Phone: [phone]  Laurie [name] is a 81 y.o. female who presents today for same-day visit.  Patient notes 2-3 days of dysuria, urinary frequency, and urinary urgency. She notes she drank a lot of caffeine on Wednesday and subsequently developed the symptoms. Has a distant history of UTI. No vaginal discharge or itching. No fevers. No chills. No abdominal pain. She feels well overall.  PMH: nonsmoker.   ROS see history of present illness  Objective  Physical Exam Filed Vitals:   06/03/15 1311  BP: 132/66  Pulse: 80  Temp: 98.1 F (36.7 C)    Physical Exam  Constitutional: She is well-developed, well-nourished, and in no distress.  Cardiovascular: Normal rate.  Exam reveals no gallop and no friction rub.   No murmur heard. Irregular rhythm  Pulmonary/Chest: Effort normal and breath sounds normal. No respiratory distress. She has no wheezes. She has no rales.  Abdominal: Soft. She exhibits no distension. There is no tenderness. There is no rebound and no guarding.  Neurological: She is alert. Gait normal.  Skin: Skin is warm and dry. She is not diaphoretic.     Assessment/Plan: Please see individual problem list.  Acute cystitis with hematuria Symptoms and UA consistent with UTI. Benign abdominal exam. We'll send for urine culture and urine micro-. Treat with Keflex. Given return precautions.    Orders Placed This Encounter  Procedures  . Urine Culture  . Urine Microscopic Only  . POCT Urinalysis Dipstick    Meds ordered this encounter  Medications  . cephALEXin (KEFLEX) 500 MG capsule    Sig: Take 1 capsule (500 mg total) by mouth 2 (two) times daily.    Dispense:  14 capsule    Refill:  0    Dragon voice recognition software was used during the dictation process of this note. If any phrases or words seem inappropriate it is likely secondary to the  translation process being inefficient.  Laurie [name]

## 2015-06-03 NOTE — Progress Notes (Signed)
Pre visit review using our clinic review tool, if applicable. No additional management support is needed unless otherwise documented below in the visit note. 

## 2015-06-03 NOTE — Patient Instructions (Signed)
Nice to meet you. You have a UTI. We will treat this with an antibiotic. If you develops abdominal pain, fevers, nausea, vomiting, aches, or any new or changing symptoms please seek medical attention.

## 2015-06-03 NOTE — Assessment & Plan Note (Signed)
Symptoms and UA consistent with UTI. Benign abdominal exam. We'll send for urine culture and urine micro-. Treat with Keflex. Given return precautions.

## 2015-06-06 LAB — URINE CULTURE: Colony Count: >=100000

## 2015-06-09 DIAGNOSIS — Z01419 Encounter for gynecological examination (general) (routine) without abnormal findings: Secondary | ICD-10-CM | POA: Diagnosis not present

## 2015-07-05 ENCOUNTER — Other Ambulatory Visit: Payer: Self-pay | Admitting: *Deleted

## 2015-07-05 ENCOUNTER — Telehealth: Payer: Self-pay | Admitting: *Deleted

## 2015-07-05 MED ORDER — RIVAROXABAN 20 MG PO TABS: ORAL_TABLET | ORAL | Status: DC

## 2015-07-05 NOTE — Telephone Encounter (Signed)
Refill sent for xarelto 20 mg  

## 2015-07-05 NOTE — Telephone Encounter (Signed)
°*  STAT* If patient is at the pharmacy, call can be transferred to refill team.   1. Which medications need to be refilled? (please list name of each medication and dose if known) Xarelto 20 mg  2. Which pharmacy/location (including street and city if local pharmacy) is medication to be sent to? [facility] in [place]  3. Do they need a 30 day or 90 day supply? 90 dayu

## 2015-07-05 NOTE — Telephone Encounter (Signed)
Requested Prescriptions   Signed Prescriptions Disp Refills  . rivaroxaban (XARELTO) 20 MG TABS tablet 30 tablet 3    Sig: TAKE 1 TABLET (20 MG TOTAL) BY MOUTH DAILY.    Authorizing Provider: Lorine Bears A    Ordering User: Kendrick Fries

## 2015-08-26 ENCOUNTER — Other Ambulatory Visit: Payer: Self-pay | Admitting: Internal Medicine

## 2015-08-29 ENCOUNTER — Other Ambulatory Visit: Payer: Self-pay | Admitting: Internal Medicine

## 2015-11-23 DIAGNOSIS — L509 Urticaria, unspecified: Secondary | ICD-10-CM | POA: Diagnosis not present

## 2015-11-23 DIAGNOSIS — I8312 Varicose veins of left lower extremity with inflammation: Secondary | ICD-10-CM | POA: Diagnosis not present

## 2015-11-23 DIAGNOSIS — I8311 Varicose veins of right lower extremity with inflammation: Secondary | ICD-10-CM | POA: Diagnosis not present

## 2015-11-23 DIAGNOSIS — I788 Other diseases of capillaries: Secondary | ICD-10-CM | POA: Diagnosis not present

## 2015-11-23 DIAGNOSIS — L728 Other follicular cysts of the skin and subcutaneous tissue: Secondary | ICD-10-CM | POA: Diagnosis not present

## 2015-12-29 ENCOUNTER — Encounter (INDEPENDENT_AMBULATORY_CARE_PROVIDER_SITE_OTHER): Payer: Self-pay

## 2015-12-29 ENCOUNTER — Ambulatory Visit (INDEPENDENT_AMBULATORY_CARE_PROVIDER_SITE_OTHER): Payer: PPO | Admitting: Cardiovascular Disease

## 2015-12-29 ENCOUNTER — Encounter: Payer: Self-pay | Admitting: Cardiovascular Disease

## 2015-12-29 VITALS — BP 150/80 | HR 91 | Ht 64.0 in | Wt 146.0 lb

## 2015-12-29 DIAGNOSIS — I481 Persistent atrial fibrillation: Secondary | ICD-10-CM

## 2015-12-29 DIAGNOSIS — I10 Essential (primary) hypertension: Secondary | ICD-10-CM | POA: Diagnosis not present

## 2015-12-29 DIAGNOSIS — I4819 Other persistent atrial fibrillation: Secondary | ICD-10-CM

## 2015-12-29 MED ORDER — DILTIAZEM HCL ER COATED BEADS 180 MG PO CP24: 180.0000 mg | ORAL_CAPSULE | Freq: Every day | ORAL | 6 refills | Status: DC

## 2015-12-29 NOTE — Progress Notes (Signed)
Cardiology Office Note   Date:  12/29/2015   ID:  Cathye, Kreiter Apr 16, 1931, MRN [id number]  PCP:  Sherlene Shams, MD  Cardiologist:   Lorine Bears, MD   Chief Complaint  Patient presents with  . Other    12 month follow up. Meds reviewed by the patient verbally. "doing well."       History of Present Illness: Laurie [name] is a 82 y.o. female who presents for a followup visit regarding chronic atrial fibrillation. This was initially diagnosed in 2012 at [facility]. She had a stress echo done at that time which showed no evidence of ischemia. She is being treated with diltiazem for rate control. Echocardiogram showed normal LV systolic function and mildly dilated left atrium. She is on anticoagulation with Xarelto.  Overall she has been doing well and denies any chest pain, dyspnea or palpitations. She lost her husband of 62 years in January 2015.  She went to the beach recently and came back sick with dry cough and some congestion. Symptoms are improving. She has been taking her medications regularly with no reported side effects. No bleeding complications with anticoagulation. I reviewed her labs from August of last year and they were overall unremarkable.    Past Medical History:  Diagnosis Date  . Arrhythmia   . Hypertension     Past Surgical History:  Procedure Laterality Date  . TONSILLECTOMY       Current Outpatient Prescriptions  Medication Sig Dispense Refill  . Calcium-Vitamin D (CALCET PO) Take 2 tablets by mouth daily.     . chlorpheniramine (CHLOR-TRIMETON) 4 MG tablet Take 4 mg by mouth 2 (two) times daily as needed.    . diltiazem (CARDIZEM CD) 120 MG 24 hr capsule TAKE ONE CAPSULE BY MOUTH TWICE A DAY 180 capsule 1  . fish oil-omega-3 fatty acids 1000 MG capsule Take 1 g by mouth daily.     Marland Kitchen lisinopril (PRINIVIL,ZESTRIL) 40 MG tablet TAKE 1 TABLET BY MOUTH EVERY DAY 90 tablet 1  . rivaroxaban (XARELTO) 20 MG TABS tablet TAKE 1 TABLET (20 MG TOTAL)  BY MOUTH DAILY. 30 tablet 3  . Vitamin D, Cholecalciferol, 400 units TABS Take by mouth.     No current facility-administered medications for this visit.     Allergies:   Hydrochlorothiazide; Losartan; Metoprolol; and Triamterene    Social History:  The patient  reports that she has never smoked. She has never used smokeless tobacco. She reports that she does not drink alcohol or use drugs.   Family History:  The patient's family history includes Anemia in her mother; Heart disease (age of onset: 63) in her father; Kidney disease in her mother.    ROS:  Please see the history of present illness.   Otherwise, review of systems are positive for none.   All other systems are reviewed and negative.    PHYSICAL EXAM: VS:  BP (!) 150/80 (BP Location: Left Arm, Patient Position: Sitting, Cuff Size: Normal)   Pulse 91   Ht  (1.626 m)   Wt 146 lb (66.2 kg)   BMI 25.06 kg/m  , BMI Body mass index is 25.06 kg/m. GEN: Well nourished, well developed, in no acute distress  HEENT: normal  Neck: no JVD, carotid bruits, or masses Cardiac: Irregularly irregular; no murmurs, rubs, or gallops,no edema  Respiratory:  clear to auscultation bilaterally, normal work of breathing GI: soft, nontender, nondistended, + BS MS: no deformity or atrophy  Skin:  warm and dry, no rash Neuro:  Strength and sensation are intact Psych: euthymic mood, full affect   EKG:  EKG is ordered today. The ekg ordered today demonstrates atrial fibrillation with ventricular rate of 91 bpm.   Recent Labs: 01/06/2015: ALT 11; BUN 16; Creatinine, Ser 0.80; Hemoglobin 14.1; Platelets 188.0; Potassium 5.0; Sodium 139; TSH 1.57    Lipid Panel    Component Value Date/Time   CHOL 218 (H) 01/06/2015 1051   TRIG 152.0 (H) 01/06/2015 1051   HDL 48.20 01/06/2015 1051   CHOLHDL 5 01/06/2015 1051   VLDL 30.4 01/06/2015 1051   LDLCALC 139 (H) 01/06/2015 1051   LDLDIRECT 164.0 07/01/2014 1419      Wt Readings from Last 3  Encounters:  12/29/15 146 lb (66.2 kg)  06/03/15 151 lb (68.5 kg)  01/06/15 142 lb 12 oz (64.8 kg)         ASSESSMENT AND PLAN:  1.  Chronic atrial fibrillation: Ventricular rate tends to be somewhat on the higher side and thus I elected to increase the dose of diltiazem extended release to 180 mg once daily. She is tolerating anticoagulation with Xarelto with no bleeding complications. She is due for a physical in the near future. I reviewed her labs from last year and overall they were unremarkable.  2. Upper respiratory tract infection: Seems to be viral. Her lungs are clear. I explained to her that she can use Robitussin and an antihistamine for symptomatic relief. Avoid decongestants.  3. Essential hypertension: Blood pressure is mildly elevated today. Diltiazem was increased as outlined above.    Disposition:   FU with me in 1 year  Signed,  Lorine Bears, MD  12/29/2015 9:54 AM    [facility]

## 2015-12-29 NOTE — Patient Instructions (Signed)
Medication Instructions:  Your physician has recommended you make the following change in your medication:  INCREASE diltiazem ER to 180mg  once daily   Labwork: none  Testing/Procedures: none  Follow-Up: Your physician wants you to follow-up in: one year with Dr. Kirke Corin.  You will receive a reminder letter in the mail two months in advance. If you don't receive a letter, please call our office to schedule the follow-up appointment.   Any Other Special Instructions Will Be Listed Below (If Applicable).     If you need a refill on your cardiac medications before your next appointment, please call your pharmacy.

## 2016-01-13 ENCOUNTER — Ambulatory Visit (INDEPENDENT_AMBULATORY_CARE_PROVIDER_SITE_OTHER): Payer: PPO | Admitting: Family Medicine

## 2016-01-13 DIAGNOSIS — J309 Allergic rhinitis, unspecified: Secondary | ICD-10-CM | POA: Diagnosis not present

## 2016-01-13 MED ORDER — FLUTICASONE PROPIONATE 50 MCG/ACT NA SUSP: 2.0000 | Freq: Every day | NASAL | 6 refills | Status: DC

## 2016-01-13 NOTE — Assessment & Plan Note (Signed)
Symptoms most consistent with patient's history of allergies. Potentially had a virus initially. She can continue Zyrtec. We will add Flonase. Discussed that cough following onset of allergy symptoms or viral symptoms could persist for several weeks. She'll continue to monitor. Given return precautions.

## 2016-01-13 NOTE — Progress Notes (Signed)
  Marikay AlarEric Richardo Popoff, MD Phone: [phone]  Laurie [name] is a 82 y.o. female who presents today for same-day visit.  Patient notes 2 weeks ago she had rhinorrhea and postnasal drip. Since then she has had mild cough and a tickle in her throat. Cough is dry. No longer having rhinorrhea. Not much postnasal drip. No congestion or fevers. Has been taking Zyrtec. Also taking over-the-counter Robitussin.  ROS see history of present illness  Objective  Physical Exam Vitals:   01/13/16 1036  BP: 122/82  Pulse: 66  Resp: 12  Temp: 97.6 F (36.4 C)    BP Readings from Last 3 Encounters:  01/13/16 122/82  12/29/15 (!) 150/80  06/03/15 132/66   Wt Readings from Last 3 Encounters:  01/13/16 144 lb (65.3 kg)  12/29/15 146 lb (66.2 kg)  06/03/15 151 lb (68.5 kg)    Physical Exam  Constitutional: No distress.  HENT:  Head: Normocephalic and atraumatic.  Mouth/Throat: Oropharynx is clear and moist. No oropharyngeal exudate.  Normal TMs bilaterally, no sinus tenderness on percussion  Eyes: Conjunctivae are normal. Pupils are equal, round, and reactive to light.  Neck: Neck supple.  Cardiovascular: Normal heart sounds.  An irregularly irregular rhythm present.  Pulmonary/Chest: Effort normal and breath sounds normal.  Lymphadenopathy:    She has no cervical adenopathy.  Skin: Skin is warm and dry. She is not diaphoretic.     Assessment/Plan: Please see individual problem list.  Allergic rhinitis Symptoms most consistent with patient's history of allergies. Potentially had a virus initially. She can continue Zyrtec. We will add Flonase. Discussed that cough following onset of allergy symptoms or viral symptoms could persist for several weeks. She'll continue to monitor. Given return precautions.   No orders of the defined types were placed in this encounter.   Meds ordered this encounter  Medications  . fluticasone (FLONASE) 50 MCG/ACT nasal spray    Sig: Place 2 sprays into  both nostrils daily.    Dispense:  16 g    Refill:  6    Marikay AlarEric Vylet Maffia, MD [facility]

## 2016-01-13 NOTE — Progress Notes (Signed)
Pre visit review using our clinic review tool, if applicable. No additional management support is needed unless otherwise documented below in the visit note. 

## 2016-01-13 NOTE — Patient Instructions (Signed)
Nice to meet you. Your symptoms are likely related to allergies. We will start her on Flonase in addition to Zyrtec. If you're not improving by sometime late next week with this please let us know. If you develop fever, cough productive of blood, or any new or changing symptoms please seek medical attention.

## 2016-02-17 ENCOUNTER — Other Ambulatory Visit: Payer: Self-pay | Admitting: Internal Medicine

## 2016-02-17 NOTE — Telephone Encounter (Signed)
Refill only for 1 month no refill, patient must come in for OV with labs

## 2016-02-20 ENCOUNTER — Other Ambulatory Visit: Payer: Self-pay | Admitting: *Deleted

## 2016-02-20 MED ORDER — DILTIAZEM HCL ER COATED BEADS 180 MG PO CP24: 180.0000 mg | ORAL_CAPSULE | Freq: Every day | ORAL | 6 refills | Status: DC

## 2016-02-21 ENCOUNTER — Other Ambulatory Visit: Payer: Self-pay | Admitting: *Deleted

## 2016-02-21 MED ORDER — DILTIAZEM HCL ER COATED BEADS 180 MG PO CP24: 180.0000 mg | ORAL_CAPSULE | Freq: Every day | ORAL | 3 refills | Status: DC

## 2016-03-07 DIAGNOSIS — Z961 Presence of intraocular lens: Secondary | ICD-10-CM | POA: Diagnosis not present

## 2016-03-26 ENCOUNTER — Ambulatory Visit (INDEPENDENT_AMBULATORY_CARE_PROVIDER_SITE_OTHER): Payer: PPO | Admitting: Internal Medicine

## 2016-03-26 VITALS — BP 162/88 | HR 69 | Temp 98.0°F | Resp 12 | Ht 64.0 in | Wt 145.8 lb

## 2016-03-26 DIAGNOSIS — Z23 Encounter for immunization: Secondary | ICD-10-CM

## 2016-03-26 DIAGNOSIS — Z0000 Encounter for general adult medical examination without abnormal findings: Secondary | ICD-10-CM

## 2016-03-26 DIAGNOSIS — I4819 Other persistent atrial fibrillation: Secondary | ICD-10-CM

## 2016-03-26 DIAGNOSIS — Z79899 Other long term (current) drug therapy: Secondary | ICD-10-CM | POA: Diagnosis not present

## 2016-03-26 DIAGNOSIS — E782 Mixed hyperlipidemia: Secondary | ICD-10-CM | POA: Diagnosis not present

## 2016-03-26 DIAGNOSIS — R5383 Other fatigue: Secondary | ICD-10-CM

## 2016-03-26 DIAGNOSIS — E559 Vitamin D deficiency, unspecified: Secondary | ICD-10-CM | POA: Diagnosis not present

## 2016-03-26 DIAGNOSIS — Z1239 Encounter for other screening for malignant neoplasm of breast: Secondary | ICD-10-CM

## 2016-03-26 DIAGNOSIS — Z1231 Encounter for screening mammogram for malignant neoplasm of breast: Secondary | ICD-10-CM

## 2016-03-26 DIAGNOSIS — I481 Persistent atrial fibrillation: Secondary | ICD-10-CM

## 2016-03-26 DIAGNOSIS — Z780 Asymptomatic menopausal state: Secondary | ICD-10-CM | POA: Diagnosis not present

## 2016-03-26 LAB — CBC WITH DIFFERENTIAL/PLATELET
BASOS ABS: 0 10*3/uL (ref 0.0–0.1)
BASOS PCT: 0.6 % (ref 0.0–3.0)
EOS ABS: 0.2 10*3/uL (ref 0.0–0.7)
Eosinophils Relative: 3.5 % (ref 0.0–5.0)
HCT: 41.5 % (ref 36.0–46.0)
HEMOGLOBIN: 14.1 g/dL (ref 12.0–15.0)
Lymphocytes Relative: 31.3 % (ref 12.0–46.0)
Lymphs Abs: 1.6 10*3/uL (ref 0.7–4.0)
MCHC: 33.9 g/dL (ref 30.0–36.0)
MCV: 91.3 fl (ref 78.0–100.0)
MONO ABS: 0.3 10*3/uL (ref 0.1–1.0)
Monocytes Relative: 6 % (ref 3.0–12.0)
Neutro Abs: 3 10*3/uL (ref 1.4–7.7)
Neutrophils Relative %: 58.6 % (ref 43.0–77.0)
Platelets: 211 10*3/uL (ref 150.0–400.0)
RBC: 4.55 Mil/uL (ref 3.87–5.11)
RDW: 14.4 % (ref 11.5–15.5)
WBC: 5.1 10*3/uL (ref 4.0–10.5)

## 2016-03-26 LAB — LIPID PANEL
CHOL/HDL RATIO: 4
Cholesterol: 223 mg/dL — ABNORMAL HIGH (ref 0–200)
HDL: 50 mg/dL (ref >39.00)
LDL CALC: 143 mg/dL — AB (ref 0–99)
NONHDL: 173.29
Triglycerides: 151 mg/dL — ABNORMAL HIGH (ref 0.0–149.0)
VLDL: 30.2 mg/dL (ref 0.0–40.0)

## 2016-03-26 LAB — COMPREHENSIVE METABOLIC PANEL
ALK PHOS: 62 U/L (ref 39–117)
ALT: 12 U/L (ref 0–35)
AST: 15 U/L (ref 0–37)
Albumin: 4.4 g/dL (ref 3.5–5.2)
BILIRUBIN TOTAL: 0.8 mg/dL (ref 0.2–1.2)
BUN: 15 mg/dL (ref 6–23)
CO2: 27 meq/L (ref 19–32)
CREATININE: 0.82 mg/dL (ref 0.40–1.20)
Calcium: 10 mg/dL (ref 8.4–10.5)
Chloride: 104 mEq/L (ref 96–112)
GFR: 70.75 mL/min (ref >60.00)
GLUCOSE: 103 mg/dL — AB (ref 70–99)
Potassium: 4.5 mEq/L (ref 3.5–5.1)
Sodium: 141 mEq/L (ref 135–145)
TOTAL PROTEIN: 6.8 g/dL (ref 6.0–8.3)

## 2016-03-26 LAB — TSH: TSH: 1.34 u[IU]/mL (ref 0.35–4.50)

## 2016-03-26 LAB — VITAMIN D 25 HYDROXY (VIT D DEFICIENCY, FRACTURES): VITD: 46.82 ng/mL (ref 30.00–100.00)

## 2016-03-26 MED ORDER — DILTIAZEM HCL ER 120 MG PO CP12: 120.0000 mg | ORAL_CAPSULE | Freq: Two times a day (BID) | ORAL | 1 refills | Status: DC

## 2016-03-26 MED ORDER — LISINOPRIL 40 MG PO TABS: 40.0000 mg | ORAL_TABLET | Freq: Two times a day (BID) | ORAL | 1 refills | Status: DC

## 2016-03-26 NOTE — Patient Instructions (Signed)

## 2016-03-26 NOTE — Progress Notes (Signed)
Pre-visit discussion using our clinic review tool. No additional management support is needed unless otherwise documented below in the visit note.  

## 2016-03-26 NOTE — Progress Notes (Signed)
Patient ID: Laurie [name], female    DOB: [birth year]  Age: 82 y.o. MRN: [id number]  The patient is here for annual wellness examination and management of other chronic and acute problems.   The risk factors are reflected in the social history.  The roster of all physicians providing medical care to patient - is listed in the Snapshot section of the chart.  Activities of daily living:  The patient is 100% independent in all ADLs: dressing, toileting, feeding as well as independent mobility  Home safety : The patient has smoke detectors in the home. They wear seatbelts.  There are no firearms at home. There is no violence in the home.   There is no risks for hepatitis, STDs or HIV. There is no   history of blood transfusion. They have no travel history to infectious disease endemic areas of the world.  The patient has seen their dentist in the last six month. They have seen their eye doctor in the last year. They admit to slight hearing difficulty with regard to whispered voices and some television programs.  They have deferred audiologic testing in the last year.  They do not  have excessive sun exposure. Discussed the need for sun protection: hats, long sleeves and use of sunscreen if there is significant sun exposure.   Diet: the importance of a healthy diet is discussed. They do have a healthy diet.  The benefits of regular aerobic exercise were discussed. She walks 4 times per week ,  20 minutes.   Depression screen: there are no signs or vegative symptoms of depression- irritability, change in appetite, anhedonia, sadness/tearfullness.  Cognitive assessment: the patient manages all their financial and personal affairs and is actively engaged. They could relate day,date,year and events; recalled 2/3 objects at 3 minutes; performed clock-face test normally.  The following portions of the patient's history were reviewed and updated as appropriate: allergies, current medications, past family  history, past medical history,  past surgical history, past social history  and problem list.  Visual acuity was not assessed per patient preference since she has regular follow up with her ophthalmologist. Hearing and body mass index were assessed and reviewed.   During the course of the visit the patient was educated and counseled about appropriate screening and preventive services including : fall prevention , diabetes screening, nutrition counseling, colorectal cancer screening, and recommended immunizations.    CC: The primary encounter diagnosis was Fatigue, unspecified type. Diagnoses of Vitamin D deficiency, Post-menopausal, Postmenopausal estrogen deficiency, Breast cancer screening, Mixed hyperlipidemia, Long-term use of high-risk medication, Need for prophylactic vaccination against Streptococcus pneumoniae (pneumococcus), Encounter for preventive health examination, and Persistent atrial fibrillation (HCC) were also pertinent to this visit.  Using lower dose of cardizem twice daily due to the 180 mg once daily dose prescribed in July by Dr Kirke Corin was making her feel jittery.  BP has been 144/84 ,  Lowest has been 120/80.  House alarm went off at 7:25 am . ADT called her and said the front door had been tried.  Once it was daylight she noticed several things in her yard had been knocked over (by the wind?)   Occasional insomnia.  Lives alone since husband died in 68.  Does not want meds Some bladder urgency  Bowel moving regularly Occasional fluid retention in ankles   istory Laurie [name] has a past medical history of Arrhythmia and Hypertension.   She has a past surgical history that includes Tonsillectomy.   Her family history includes  Anemia in her mother; Heart disease (age of onset: 52) in her father; Kidney disease in her mother.She reports that she has never smoked. She has never used smokeless tobacco. She reports that she does not drink alcohol or use drugs.  Outpatient  Medications Prior to Visit  Medication Sig Dispense Refill  . Calcium-Vitamin D (CALCET PO) Take 2 tablets by mouth daily.     . chlorpheniramine (CHLOR-TRIMETON) 4 MG tablet Take 4 mg by mouth 2 (two) times daily as needed.    . fish oil-omega-3 fatty acids 1000 MG capsule Take 1 g by mouth daily.     . fluticasone (FLONASE) 50 MCG/ACT nasal spray Place 2 sprays into both nostrils daily. 16 g 6  . rivaroxaban (XARELTO) 20 MG TABS tablet TAKE 1 TABLET (20 MG TOTAL) BY MOUTH DAILY. 30 tablet 3  . Vitamin D, Cholecalciferol, 400 units TABS Take by mouth.    Marland Kitchen lisinopril (PRINIVIL,ZESTRIL) 40 MG tablet TAKE 1 TABLET BY MOUTH EVERY DAY 30 tablet 0  . diltiazem (CARDIZEM CD) 180 MG 24 hr capsule Take 1 capsule (180 mg total) by mouth daily. (Patient taking differently: Take 120 mg by mouth 2 (two) times daily. ) 90 capsule 3   No facility-administered medications prior to visit.     Review of Systems   Patient denies headache, fevers, malaise, unintentional weight loss, skin rash, eye pain, sinus congestion and sinus pain, sore throat, dysphagia,  hemoptysis , cough, dyspnea, wheezing, chest pain, palpitations, orthopnea, edema, abdominal pain, nausea, melena, diarrhea, constipation, flank pain, dysuria, hematuria, urinary  Frequency, nocturia, numbness, tingling, seizures,  Focal weakness, Loss of consciousness,  Tremor, insomnia, depression, anxiety, and suicidal ideation.      Objective:  BP (!) 162/88   Pulse 69   Temp 98 F (36.7 C) (Oral)   Resp 12   Ht 5\' 4"  (1.626 m)   Wt 145 lb 12 oz (66.1 kg)   SpO2 98%   BMI 25.02 kg/m   Physical Exam   General appearance: alert, cooperative and appears stated age Ears: normal TM's and external ear canals both ears Throat: lips, mucosa, and tongue normal; teeth and gums normal Neck: no adenopathy, no carotid bruit, supple, symmetrical, trachea midline and thyroid not enlarged, symmetric, no tenderness/mass/nodules Back: symmetric, no  curvature. ROM normal. No CVA tenderness. Lungs: clear to auscultation bilaterally Heart: regular rate and rhythm, S1, S2 normal, no murmur, click, rub or gallop Abdomen: soft, non-tender; bowel sounds normal; no masses,  no organomegaly Pulses: 2+ and symmetric Skin: Skin color, texture, turgor normal. No rashes or lesions Lymph nodes: Cervical, supraclavicular, and axillary nodes normal.    Assessment & Plan:   Problem List Items Addressed This Visit    Atrial fibrillation (HCC)    Has not tolerated CArdizem CD 180 mg due to recurrent feelings of palpitations and nervousness. .  Continue Cardizem 120 mg q 12 hours.       Relevant Medications   diltiazem (CARDIZEM SR) 120 MG 12 hr capsule   lisinopril (PRINIVIL,ZESTRIL) 40 MG tablet   Encounter for preventive health examination    Annual comprehensive preventive exam was done as well as an evaluation and management of chronic conditions .  During the course of the visit the patient was educated and counseled about appropriate screening and preventive services including :  diabetes screening, lipid analysis with projected  10 year  risk for CAD , nutrition counseling, breast cancer  and recommended immunizations.  Printed recommendations for health maintenance  screenings was given.   Lab Results  Component Value Date   TSH 1.34 03/26/2016   Lab Results  Component Value Date   CREATININE 0.82 03/26/2016   Lab Results  Component Value Date   NA 141 03/26/2016   K 4.5 03/26/2016   CL 104 03/26/2016   CO2 27 03/26/2016   Lab Results  Component Value Date   CHOL 223 (H) 03/26/2016   HDL 50.00 03/26/2016   LDLCALC 143 (H) 03/26/2016   LDLDIRECT 164.0 07/01/2014   TRIG 151.0 (H) 03/26/2016   CHOLHDL 4 03/26/2016         Hyperlipidemia   Relevant Medications   diltiazem (CARDIZEM SR) 120 MG 12 hr capsule   lisinopril (PRINIVIL,ZESTRIL) 40 MG tablet   Other Relevant Orders   Lipid panel (Completed)    Other Visit  Diagnoses    Fatigue, unspecified type    -  Primary   Relevant Orders   Comprehensive metabolic panel (Completed)   TSH (Completed)   Vitamin D deficiency       Relevant Orders   VITAMIN D 25 Hydroxy (Vit-D Deficiency, Fractures) (Completed)   Post-menopausal       Postmenopausal estrogen deficiency       Relevant Orders   Comprehensive metabolic panel (Completed)   DG Bone Density   Breast cancer screening       Relevant Orders   MM SCREENING BREAST TOMO BILATERAL   Long-term use of high-risk medication       Relevant Orders   CBC with Differential/Platelet (Completed)   Need for prophylactic vaccination against Streptococcus pneumoniae (pneumococcus)       Relevant Orders   Pneumococcal polysaccharide vaccine 23-valent greater than or equal to 2yo subcutaneous/IM (Completed)      I have changed Ms. Brillhart's diltiazem and lisinopril. I am also having her maintain her fish oil-omega-3 fatty acids, chlorpheniramine, Calcium-Vitamin D (CALCET PO), rivaroxaban, Vitamin D (Cholecalciferol), fluticasone, and diltiazem.  Meds ordered this encounter  Medications  . DISCONTD: diltiazem (DILTIAZEM CD) 120 MG 24 hr capsule    Sig: Take 120 mg by mouth 2 (two) times daily.  Marland Kitchen. DISCONTD: diltiazem (CARDIZEM SR) 120 MG 12 hr capsule    Sig: Take 120 mg by mouth 2 (two) times daily.  Marland Kitchen. diltiazem (CARDIZEM SR) 120 MG 12 hr capsule    Sig: Take 1 capsule (120 mg total) by mouth 2 (two) times daily.    Dispense:  180 capsule    Refill:  1    KEEP ON FILE FOR FUTURE REFILLS  . lisinopril (PRINIVIL,ZESTRIL) 40 MG tablet    Sig: Take 1 tablet (40 mg total) by mouth 2 (two) times daily.    Dispense:  180 tablet    Refill:  1    NOTE CHANGE IN DOSE    Medications Discontinued During This Encounter  Medication Reason  . diltiazem (DILTIAZEM CD) 120 MG 24 hr capsule Error  . diltiazem (CARDIZEM SR) 120 MG 12 hr capsule Reorder  . lisinopril (PRINIVIL,ZESTRIL) 40 MG tablet Reorder   A  total of 40 minutes was spent with patient more than half of which was spent in counseling patient on the above mentioned issues , reviewing and explaining recent labs and imaging studies done, and coordination of care.  Follow-up: No Follow-up on file.   [name], [name], MD

## 2016-03-27 DIAGNOSIS — Z0000 Encounter for general adult medical examination without abnormal findings: Secondary | ICD-10-CM | POA: Insufficient documentation

## 2016-03-27 NOTE — Assessment & Plan Note (Addendum)
Annual comprehensive preventive exam was done as well as an evaluation and management of chronic conditions .  During the course of the visit the patient was educated and counseled about appropriate screening and preventive services including :  diabetes screening, lipid analysis with projected  10 year  risk for CAD , nutrition counseling, breast cancer  and recommended immunizations.  Printed recommendations for health maintenance screenings was given.   Lab Results  Component Value Date   TSH 1.34 03/26/2016   Lab Results  Component Value Date   CREATININE 0.82 03/26/2016   Lab Results  Component Value Date   NA 141 03/26/2016   K 4.5 03/26/2016   CL 104 03/26/2016   CO2 27 03/26/2016   Lab Results  Component Value Date   CHOL 223 (H) 03/26/2016   HDL 50.00 03/26/2016   LDLCALC 143 (H) 03/26/2016   LDLDIRECT 164.0 07/01/2014   TRIG 151.0 (H) 03/26/2016   CHOLHDL 4 03/26/2016

## 2016-03-27 NOTE — Assessment & Plan Note (Signed)
Has not tolerated CArdizem CD 180 mg due to recurrent feelings of palpitations and nervousness. .  Continue Cardizem 120 mg q 12 hours.

## 2016-03-29 ENCOUNTER — Encounter: Payer: Self-pay | Admitting: Internal Medicine

## 2016-04-16 ENCOUNTER — Ambulatory Visit (INDEPENDENT_AMBULATORY_CARE_PROVIDER_SITE_OTHER): Payer: PPO

## 2016-04-16 VITALS — BP 138/82 | HR 95 | Resp 18

## 2016-04-16 DIAGNOSIS — I10 Essential (primary) hypertension: Secondary | ICD-10-CM

## 2016-04-16 DIAGNOSIS — I4819 Other persistent atrial fibrillation: Secondary | ICD-10-CM

## 2016-04-16 NOTE — Progress Notes (Addendum)
Patient came in for a BP check.  Was seen last on 10/23 and BP was 162/88, changed medications to Cardizem 120mg  PO Q12 and Lisinopril 40mg  daily.  Patient denies heart palpations ,or issues since increase. Prior to patient arriving asked PCP for verbal to check BP due to prior appt readings and change in medications, she agreed and gave order.  Doesn't have home readings to compare.  Checked bilateral upper extremities, see vitals for details.  Please advise. thanks

## 2016-04-22 NOTE — Progress Notes (Signed)
  I have reviewed the above information and agree with above.   Elizar Alpern, MD 

## 2016-05-17 ENCOUNTER — Ambulatory Visit
Admission: RE | Admit: 2016-05-17 | Discharge: 2016-05-17 | Disposition: A | Discharge status: Home / Self Care | Payer: PPO | Source: Ambulatory Visit | Attending: Internal Medicine | Admitting: Internal Medicine

## 2016-05-17 DIAGNOSIS — Z1231 Encounter for screening mammogram for malignant neoplasm of breast: Secondary | ICD-10-CM | POA: Insufficient documentation

## 2016-05-17 DIAGNOSIS — M85852 Other specified disorders of bone density and structure, left thigh: Secondary | ICD-10-CM | POA: Diagnosis not present

## 2016-05-17 DIAGNOSIS — Z780 Asymptomatic menopausal state: Secondary | ICD-10-CM | POA: Diagnosis not present

## 2016-05-17 DIAGNOSIS — Z1239 Encounter for other screening for malignant neoplasm of breast: Secondary | ICD-10-CM

## 2016-05-19 ENCOUNTER — Encounter: Payer: Self-pay | Admitting: *Deleted

## 2016-05-23 ENCOUNTER — Telehealth: Payer: Self-pay | Admitting: Internal Medicine

## 2016-05-23 NOTE — Telephone Encounter (Signed)
Bone density are in chart please advise resulted 05/17/16

## 2016-05-23 NOTE — Telephone Encounter (Signed)
Pt called request her Bone Density results. Thank you!  Call pt @ [phone]

## 2016-05-24 NOTE — Telephone Encounter (Signed)
Letter mailed on 12/16

## 2016-05-25 NOTE — Telephone Encounter (Signed)
Patient notified by phone of results.

## 2016-07-02 ENCOUNTER — Other Ambulatory Visit: Payer: Self-pay

## 2016-07-02 MED ORDER — RIVAROXABAN 20 MG PO TABS: ORAL_TABLET | ORAL | 3 refills | Status: DC

## 2016-08-29 ENCOUNTER — Ambulatory Visit (INDEPENDENT_AMBULATORY_CARE_PROVIDER_SITE_OTHER): Payer: PPO

## 2016-08-29 VITALS — BP 138/70 | HR 60 | Temp 98.1°F | Resp 12 | Ht 64.0 in | Wt 146.8 lb

## 2016-08-29 DIAGNOSIS — Z0000 Encounter for general adult medical examination without abnormal findings: Secondary | ICD-10-CM

## 2016-08-29 NOTE — Patient Instructions (Addendum)
  Laurie [name] , Thank you for taking time to come for your Medicare Wellness Visit. I appreciate your ongoing commitment to your health goals. Please review the following plan we discussed and let me know if I can assist you in the future.   Follow up with Dr. Darrick Huntsmanullo as needed.    Bring a copy of your Health Care Power of Attorney and/or Living Will to be scanned into chart.  Have a great day!  These are the goals we discussed: Goals    . Healthy Lifestyle          Low carb foods.  Lean meats, vegetables Stay active and continue walking for exercise  Stay hydrated and drink plenty of water/fluids       This is a list of the screening recommended for you and due dates:  Health Maintenance  Topic Date Due  . Flu Shot  04/03/2018*  . Tetanus Vaccine  03/29/2020  . DEXA scan (bone density measurement)  Completed  . Pneumonia vaccines  Completed  *Topic was postponed. The date shown is not the original due date.

## 2016-08-29 NOTE — Progress Notes (Signed)
Subjective:   Laurie [name] is a 83 y.o. female who presents for Medicare Annual (Subsequent) preventive examination.  Review of Systems:  No ROS.  Medicare Wellness Visit. Cardiac Risk Factors include: advanced age (>51men, >26 women);hypertension     Objective:     Vitals: BP 138/70 (BP Location: Left Arm, Patient Position: Sitting, Cuff Size: Normal)   Pulse 60   Temp 98.1 F (36.7 C) (Oral)   Resp 12   Ht 5\' 4"  (1.626 m)   Wt 146 lb 12.8 oz (66.6 kg)   SpO2 99%   BMI 25.20 kg/m   Body mass index is 25.2 kg/m.   Tobacco History  Smoking Status  . Never Smoker  Smokeless Tobacco  . Never Used     Counseling given: Not Answered   Past Medical History:  Diagnosis Date  . Arrhythmia   . Hypertension    Past Surgical History:  Procedure Laterality Date  . TONSILLECTOMY     Family History  Problem Relation Age of Onset  . Kidney disease Mother   . Anemia Mother   . Heart disease Father 60   History  Sexual Activity  . Sexual activity: No    Outpatient Encounter Prescriptions as of 08/29/2016  Medication Sig  . Calcium-Vitamin D (CALCET PO) Take 2 tablets by mouth daily.   . chlorpheniramine (CHLOR-TRIMETON) 4 MG tablet Take 4 mg by mouth 2 (two) times daily as needed.  . diltiazem (CARDIZEM SR) 120 MG 12 hr capsule Take 1 capsule (120 mg total) by mouth 2 (two) times daily.  . fish oil-omega-3 fatty acids 1000 MG capsule Take 1 g by mouth daily.   . fluticasone (FLONASE) 50 MCG/ACT nasal spray Place 2 sprays into both nostrils daily.  Marland Kitchen lisinopril (PRINIVIL,ZESTRIL) 40 MG tablet Take 1 tablet (40 mg total) by mouth 2 (two) times daily.  . rivaroxaban (XARELTO) 20 MG TABS tablet TAKE 1 TABLET (20 MG TOTAL) BY MOUTH DAILY.  Marland Kitchen Vitamin D, Cholecalciferol, 400 units TABS Take by mouth.  . [DISCONTINUED] diltiazem (CARDIZEM CD) 180 MG 24 hr capsule Take 1 capsule (180 mg total) by mouth daily. (Patient taking differently: Take 120 mg by mouth 2 (two) times  daily. )   No facility-administered encounter medications on file as of 08/29/2016.     Activities of Daily Living In your present state of health, do you have any difficulty performing the following activities: 08/29/2016  Hearing? N  Vision? N  Difficulty concentrating or making decisions? N  Walking or climbing stairs? N  Dressing or bathing? N  Doing errands, shopping? N  Preparing Food and eating ? N  Using the Toilet? N  In the past six months, have you accidently leaked urine? N  Do you have problems with loss of bowel control? N  Managing your Medications? N  Managing your Finances? N  Housekeeping or managing your Housekeeping? N  Some recent data might be hidden    Patient Care Team: Sherlene Shams, MD as PCP - General (Internal Medicine)    Assessment:    This is a routine wellness examination for Laurie [name]. The goal of the wellness visit is to assist the patient how to close the gaps in care and create a preventative care plan for the patient.   Taking calcium VIT D as appropriate/Osteoporosis risk reviewed.  Medications reviewed; taking without issues or barriers.  Safety issues reviewed; alarm and smoke detectors in the home. Firearms locked in a safe in the home.  Wears seatbelts when driving or riding with others. Patient does wear sunscreen or protective clothing when in direct sunlight. No violence in the home.  Patient is alert, normal appearance, oriented to person/place/and time. Correctly identified the president of the [place], recall of 2/3 words, and performing simple calculations.  Patient displays appropriate judgement and can read correct time from watch face.  No new identified risk were noted.  No failures at ADL's or IADL's.   BMI- discussed the importance of a healthy diet, water intake and exercise. Educational material provided.   HTN- followed by PCP.  Dental- every six months.  Dr. Toni ArthursFuller.  Eye- Visual acuity not assessed per patient  preference since they have regular follow up with the ophthalmologist.  Wears corrective lenses.  Sleep patterns- Sleeps 5-6 hours at night.  Wakes feeling rested.  Health maintenance gaps- closed.  Patient Concerns: None at this time. Follow up with PCP as needed.  Exercise Activities and Dietary recommendations Current Exercise Habits: Home exercise routine, Type of exercise: walking, Time (Minutes): 20, Frequency (Times/Week): 4, Weekly Exercise (Minutes/Week): 80, Intensity: Moderate  Goals    . Healthy Lifestyle          Low carb foods.  Lean meats, vegetables Stay active and continue walking for exercise  Stay hydrated and drink plenty of water/fluids      Fall Risk Fall Risk  08/29/2016 01/06/2015 07/07/2014 04/29/2013  Falls in the past year? No No No No   Depression Screen PHQ 2/9 Scores 08/29/2016 01/06/2015 07/07/2014 04/29/2013  PHQ - 2 Score 0 0 0 0     Cognitive Function MMSE - Mini Mental State Exam 08/29/2016  Orientation to time 5  Orientation to Place 5  Registration 3  Attention/ Calculation 5  Recall 3  Language- name 2 objects 2  Language- repeat 1  Language- follow 3 step command 3  Language- read & follow direction 1  Write a sentence 1  Copy design 1  Total score 30        Immunization History  Administered Date(s) Administered  . Pneumococcal Conjugate-13 07/07/2014  . Pneumococcal Polysaccharide-23 03/29/2010, 03/26/2016  . Tdap 03/29/2010  . Zoster 03/10/2010   Screening Tests Health Maintenance  Topic Date Due  . INFLUENZA VACCINE  04/03/2018 (Originally 01/03/2016)  . TETANUS/TDAP  03/29/2020  . DEXA SCAN  Completed  . PNA vac Low Risk Adult  Completed      Plan:    End of life planning; Advance aging; Advanced directives discussed. Copy of current HCPOA/Living Will requested.    Medicare Attestation I have personally reviewed: The patient's medical and social history Their use of alcohol, tobacco or illicit drugs Their current  medications and supplements The patient's functional ability including ADLs,fall risks, home safety risks, cognitive, and hearing and visual impairment Diet and physical activities Evidence for depression   The patient's weight, height, BMI, and visual acuity have been recorded in the chart.  I have made referrals and provided education to the patient based on review of the above and I have provided the patient with a written personalized care plan for preventive services.    During the course of the visit the patient was educated and counseled about the following appropriate screening and preventive services:   Vaccines to include Pneumoccal, Influenza, Hepatitis B, Td, Zostavax, HCV  Colorectal cancer screening-UTD  Bone density screening-UTD  Glaucoma screening-annual eye exam  Mammography-UTD  Nutrition counseling   Patient Instructions (the written plan) was given to the patient.  Ashok Pall, LPN  1/61/0960

## 2016-09-19 ENCOUNTER — Other Ambulatory Visit: Payer: Self-pay | Admitting: Internal Medicine

## 2016-09-20 ENCOUNTER — Ambulatory Visit
Admission: RE | Admit: 2016-09-20 | Discharge: 2016-09-20 | Disposition: A | Discharge status: Home / Self Care | Payer: PPO | Source: Ambulatory Visit | Attending: Family | Admitting: Family

## 2016-09-20 ENCOUNTER — Encounter: Payer: Self-pay | Admitting: Family

## 2016-09-20 ENCOUNTER — Ambulatory Visit (INDEPENDENT_AMBULATORY_CARE_PROVIDER_SITE_OTHER): Payer: PPO | Admitting: Family

## 2016-09-20 VITALS — BP 162/100 | HR 81 | Temp 98.1°F | Ht 64.0 in | Wt 149.2 lb

## 2016-09-20 DIAGNOSIS — M7989 Other specified soft tissue disorders: Secondary | ICD-10-CM | POA: Insufficient documentation

## 2016-09-20 DIAGNOSIS — R2242 Localized swelling, mass and lump, left lower limb: Secondary | ICD-10-CM | POA: Diagnosis not present

## 2016-09-20 DIAGNOSIS — M7122 Synovial cyst of popliteal space [Baker], left knee: Secondary | ICD-10-CM | POA: Insufficient documentation

## 2016-09-20 NOTE — Progress Notes (Signed)
Pre visit review using our clinic review tool, if applicable. No additional management support is needed unless otherwise documented below in the visit note. 

## 2016-09-20 NOTE — Progress Notes (Signed)
Subjective:    Patient ID: Laurie [name], female    DOB: [birth year], 83 y.o.   MRN: [id number]  CC: Laurie [name] is a 83 y.o. female who presents today for an acute visit.    HPI: Left Ankle swelling x one week, waxing and waning. Better in the mornings. No increase in salt.   Had been [place] and drove there however swelling started proior. Prior to had been exercising and thought pulled muscle with squats.  h/o afib; on xarelto  NO sob, fever.   HTN- taking lisinopril ; has stopped taking cardizem Denies exertional chest pain or pressure, numbness or tingling radiating to left arm or jaw, palpitations, dizziness, frequent headaches, changes in vision, or shortness of breath.         HISTORY:  Past Medical History:  Diagnosis Date  . Arrhythmia   . Hypertension    Past Surgical History:  Procedure Laterality Date  . TONSILLECTOMY     Family History  Problem Relation Age of Onset  . Kidney disease Mother   . Anemia Mother   . Heart disease Father 74    Allergies: Hydrochlorothiazide; Losartan; Metoprolol; and Triamterene Current Outpatient Prescriptions on File Prior to Visit  Medication Sig Dispense Refill  . Calcium-Vitamin D (CALCET PO) Take 2 tablets by mouth daily.     . chlorpheniramine (CHLOR-TRIMETON) 4 MG tablet Take 4 mg by mouth 2 (two) times daily as needed.    . diltiazem (CARDIZEM SR) 120 MG 12 hr capsule Take 1 capsule (120 mg total) by mouth 2 (two) times daily. 180 capsule 1  . fish oil-omega-3 fatty acids 1000 MG capsule Take 1 g by mouth daily.     . fluticasone (FLONASE) 50 MCG/ACT nasal spray Place 2 sprays into both nostrils daily. 16 g 6  . lisinopril (PRINIVIL,ZESTRIL) 40 MG tablet TAKE 1 TABLET (40 MG TOTAL) BY MOUTH 2 (TWO) TIMES DAILY. 180 tablet 1  . rivaroxaban (XARELTO) 20 MG TABS tablet TAKE 1 TABLET (20 MG TOTAL) BY MOUTH DAILY. 30 tablet 3  . Vitamin D, Cholecalciferol, 400 units TABS Take by mouth.     No current  facility-administered medications on file prior to visit.     Social History  Substance Use Topics  . Smoking status: Never Smoker  . Smokeless tobacco: Never Used  . Alcohol use No    Review of Systems  Constitutional: Negative for chills and fever.  Respiratory: Negative for cough.   Cardiovascular: Positive for leg swelling. Negative for chest pain and palpitations.  Gastrointestinal: Negative for nausea and vomiting.      Objective:    BP (!) 162/100   Pulse 81   Temp 98.1 F (36.7 C) (Oral)   Ht  (1.626 m)   Wt 149 lb 3.2 oz (67.7 kg)   SpO2 98%   BMI 25.61 kg/m    Physical Exam  Constitutional: She appears well-developed and well-nourished.  Eyes: Conjunctivae are normal.  Cardiovascular: Normal rate, regular rhythm, normal heart sounds and normal pulses.   Right LE +1 edema. No palpable cords or masses. No erythema or increased warmth. No asymmetry in calf size when compared bilaterally LE hair growth symmetric and present. No discoloration of varicosities noted. LE warm and palpable pedal pulses.   Pulmonary/Chest: Effort normal and breath sounds normal. She has no wheezes. She has no rhonchi. She has no rales.  Neurological: She is alert.  Skin: Skin is warm and dry.  Psychiatric: She has a  normal mood and affect. Her speech is normal and behavior is normal. Thought content normal.  Vitals reviewed.      Assessment & Plan:  1. Left leg swelling Concern for DVT with unilateral swelling. No SOB.  Pending [place]. If negative, we agreed on conservative therapy and suspect PVD.  - US Venous Img Lower Unilateral Left     I am having Ms. Gellerman maintain her fish oil-omega-3 fatty acids, chlorpheniramine, Calcium-Vitamin D (CALCET PO), Vitamin D (Cholecalciferol), fluticasone, diltiazem, rivaroxaban, and lisinopril.   No orders of the defined types were placed in this encounter.   Return precautions given.   Risks, benefits, and alternatives of the  medications and treatment plan prescribed today were discussed, and patient expressed understanding.   Education regarding symptom management and diagnosis given to patient on AVS.  Continue to follow with TULLO, Mar Daring, MD for routine health maintenance.   Magdalene Molly and I agreed with plan.   [name], FNP

## 2016-09-20 NOTE — Patient Instructions (Addendum)
Ultrasound   Please start back on cardizem for your atrial fib.                     As long as negative for blood clot, please buy compression stockings. Low salt diet    [facility]    Address: [address] [place], [place], [place] [address]

## 2016-09-21 ENCOUNTER — Telehealth: Payer: Self-pay | Admitting: Internal Medicine

## 2016-09-21 NOTE — Telephone Encounter (Signed)
Left message for patient to return call back.  

## 2016-09-21 NOTE — Telephone Encounter (Signed)
Pt called and had a few questions in regards to her u/s results. Please advise, thank you!  Call pt @ [phone]

## 2016-09-21 NOTE — Telephone Encounter (Signed)
Patient Could not remember what was said on the phone for results.  Patient was informed.

## 2016-09-27 ENCOUNTER — Other Ambulatory Visit: Payer: Self-pay | Admitting: Internal Medicine

## 2016-10-21 ENCOUNTER — Other Ambulatory Visit: Payer: Self-pay | Admitting: Cardiovascular Disease

## 2016-10-25 DIAGNOSIS — H15102 Unspecified episcleritis, left eye: Secondary | ICD-10-CM | POA: Diagnosis not present

## 2016-10-30 ENCOUNTER — Ambulatory Visit: Payer: PPO | Admitting: Family

## 2016-11-01 ENCOUNTER — Ambulatory Visit (INDEPENDENT_AMBULATORY_CARE_PROVIDER_SITE_OTHER): Payer: PPO | Admitting: Family

## 2016-11-01 ENCOUNTER — Encounter: Payer: Self-pay | Admitting: Family

## 2016-11-01 VITALS — BP 132/62 | HR 73 | Temp 98.1°F | Wt 145.1 lb

## 2016-11-01 DIAGNOSIS — H15102 Unspecified episcleritis, left eye: Secondary | ICD-10-CM | POA: Diagnosis not present

## 2016-11-01 DIAGNOSIS — L720 Epidermal cyst: Secondary | ICD-10-CM | POA: Diagnosis not present

## 2016-11-01 NOTE — Patient Instructions (Signed)
Referral to orthopedics  Pleasure seeing you

## 2016-11-01 NOTE — Progress Notes (Signed)
Subjective:    Patient ID: Laurie [name], female    DOB: 1931/04/05, 83 y.o.   MRN: [id number]  CC: Laurie [name] is a 83 y.o. female who presents today for an acute visit.    HPI: Right 3rd finger cyst, several months; noticed over past 2 months has grown in size.  Nontender.  No injury.  No drainage, fever, chills, numbness, tingling.       HISTORY:  Past Medical History:  Diagnosis Date  . Arrhythmia   . Hypertension    Past Surgical History:  Procedure Laterality Date  . TONSILLECTOMY     Family History  Problem Relation Age of Onset  . Kidney disease Mother   . Anemia Mother   . Heart disease Father 89    Allergies: Hydrochlorothiazide; Losartan; Metoprolol; and Triamterene Current Outpatient Prescriptions on File Prior to Visit  Medication Sig Dispense Refill  . Calcium-Vitamin D (CALCET PO) Take 2 tablets by mouth daily.     . chlorpheniramine (CHLOR-TRIMETON) 4 MG tablet Take 4 mg by mouth 2 (two) times daily as needed.    . diltiazem (CARDIZEM SR) 120 MG 12 hr capsule TAKE ONE CAPSULE BY MOUTH TWICE A DAY 180 capsule 0  . fish oil-omega-3 fatty acids 1000 MG capsule Take 1 g by mouth daily.     . fluticasone (FLONASE) 50 MCG/ACT nasal spray Place 2 sprays into both nostrils daily. 16 g 6  . lisinopril (PRINIVIL,ZESTRIL) 40 MG tablet TAKE 1 TABLET (40 MG TOTAL) BY MOUTH 2 (TWO) TIMES DAILY. 180 tablet 1  . Vitamin D, Cholecalciferol, 400 units TABS Take by mouth.    Laurie [name] 20 MG TABS tablet TAKE 1 TABLET BY MOUTH DAILY 30 tablet 2   No current facility-administered medications on file prior to visit.     Social History  Substance Use Topics  . Smoking status: Never Smoker  . Smokeless tobacco: Never Used  . Alcohol use No    Review of Systems  Constitutional: Negative for chills and fever.  Respiratory: Negative for cough.   Cardiovascular: Negative for chest pain and palpitations.  Gastrointestinal: Negative for nausea and vomiting.    Musculoskeletal: Negative for joint swelling.  Skin: Negative for rash.      Objective:    BP 132/62 (BP Location: Left Arm, Patient Position: Sitting, Cuff Size: Normal)   Pulse 73   Temp 98.1 F (36.7 C) (Oral)   Wt 145 lb 2 oz (65.8 kg)   BMI 24.91 kg/m    Physical Exam  Constitutional: She appears well-developed and well-nourished.  Eyes: Conjunctivae are normal.  Cardiovascular: Normal rate, regular rhythm, normal heart sounds and normal pulses.   Pulmonary/Chest: Effort normal and breath sounds normal. She has no wheezes. She has no rhonchi. She has no rales.  Musculoskeletal:       Right hand: She exhibits normal range of motion, no tenderness, no bony tenderness, normal capillary refill, no deformity and no swelling. Normal strength noted.       Hands: Firm circumscribed non tender nodule noted right lateral 3rd metacarpal. Noted on diagram. No erythema, drainage.   Neurological: She is alert.  Skin: Skin is warm and dry.  Psychiatric: She has a normal mood and affect. Her speech is normal and behavior is normal. Thought content normal.  Vitals reviewed.      Assessment & Plan:  1. Epidermal cyst Symptoms most consistent with epidermal cyst on right third metacarpal of right hand. Referral placed. Return precautions  given.  - Ambulatory referral to Orthopedic Surgery     I am having [name] maintain her fish oil-omega-3 fatty acids, chlorpheniramine, Calcium-Vitamin D (CALCET PO), Vitamin D (Cholecalciferol), fluticasone, lisinopril, diltiazem, and XARELTO.   No orders of the defined types were placed in this encounter.   Return precautions given.   Risks, benefits, and alternatives of the medications and treatment plan prescribed today were discussed, and patient expressed understanding.   Education regarding symptom management and diagnosis given to patient on AVS.  Continue to follow with [name], [name], MD for routine health maintenance.   Laurie [name]  M Boldon and I agreed with plan.   Laurie PlowmanMargaret Gennavieve Huq, FNP

## 2016-11-05 ENCOUNTER — Encounter: Payer: Self-pay | Admitting: Family

## 2016-11-22 DIAGNOSIS — L720 Epidermal cyst: Secondary | ICD-10-CM | POA: Diagnosis not present

## 2016-12-24 ENCOUNTER — Ambulatory Visit (INDEPENDENT_AMBULATORY_CARE_PROVIDER_SITE_OTHER): Payer: PPO | Admitting: Cardiovascular Disease

## 2016-12-24 ENCOUNTER — Encounter: Payer: Self-pay | Admitting: Cardiovascular Disease

## 2016-12-24 VITALS — BP 140/64 | HR 78 | Ht 64.0 in | Wt 144.0 lb

## 2016-12-24 DIAGNOSIS — I482 Chronic atrial fibrillation, unspecified: Secondary | ICD-10-CM

## 2016-12-24 DIAGNOSIS — I10 Essential (primary) hypertension: Secondary | ICD-10-CM

## 2016-12-24 NOTE — Patient Instructions (Signed)
Medication Instructions: Continue same medications.   Labwork: None.   Procedures/Testing: None.   Follow-Up: 1 year with Dr. Rhianne Soman.   Any Additional Special Instructions Will Be Listed Below (If Applicable).     If you need a refill on your cardiac medications before your next appointment, please call your pharmacy.   

## 2016-12-24 NOTE — Progress Notes (Signed)
Cardiology Office Note   Date:  12/24/2016   ID:  Laurie [name], Laurie [name] 04/12/1931, MRN [id number]  PCP:  Sherlene Shams, MD  Cardiologist:   Lorine Bears, MD   Chief Complaint  Patient presents with  . other    12 month follow up. Meds reviewed by the pt. verbally. "doing well."       History of Present Illness: Laurie [name] is a 83 y.o. female who presents for a followup visit regarding chronic atrial fibrillation. This was initially diagnosed in 2012 at [facility]. She had a stress echo done at that time which showed no evidence of ischemia. She is being treated with diltiazem for rate control. Echocardiogram showed normal LV systolic function and mildly dilated left atrium. She is on anticoagulation with Xarelto.  Last year, I increased the dose of diltiazem 180 mg once daily but she felt fatigued and tired with that and the dose was decreased back to 120 mg once daily. She has been doing well and denies any chest pain, shortness of breath or palpitations. Other than bruising, she has no side effects with Xarelto.   Past Medical History:  Diagnosis Date  . Arrhythmia   . Hypertension     Past Surgical History:  Procedure Laterality Date  . TONSILLECTOMY       Current Outpatient Prescriptions  Medication Sig Dispense Refill  . Calcium-Vitamin D (CALCET PO) Take 2 tablets by mouth daily.     . chlorpheniramine (CHLOR-TRIMETON) 4 MG tablet Take 4 mg by mouth 2 (two) times daily as needed.    . diltiazem (CARDIZEM SR) 120 MG 12 hr capsule TAKE ONE CAPSULE BY MOUTH TWICE A DAY 180 capsule 0  . fish oil-omega-3 fatty acids 1000 MG capsule Take 1 g by mouth daily.     . fluticasone (FLONASE) 50 MCG/ACT nasal spray Place 2 sprays into both nostrils daily. 16 g 6  . lisinopril (PRINIVIL,ZESTRIL) 40 MG tablet TAKE 1 TABLET (40 MG TOTAL) BY MOUTH 2 (TWO) TIMES DAILY. 180 tablet 1  . Vitamin D, Cholecalciferol, 400 units TABS Take by mouth.    Laurie [name] 20 MG TABS tablet TAKE 1  TABLET BY MOUTH DAILY 30 tablet 2   No current facility-administered medications for this visit.     Allergies:   Hydrochlorothiazide; Losartan; Metoprolol; and Triamterene    Social History:  The patient  reports that she has never smoked. She has never used smokeless tobacco. She reports that she does not drink alcohol or use drugs.   Family History:  The patient's family history includes Anemia in her mother; Heart disease (age of onset: 32) in her father; Kidney disease in her mother.    ROS:  Please see the history of present illness.   Otherwise, review of systems are positive for none.   All other systems are reviewed and negative.    PHYSICAL EXAM: VS:  Ht 5\' 4"  (1.626 m)   Wt 144 lb (65.3 kg)   BMI 24.72 kg/m  , BMI Body mass index is 24.72 kg/m. GEN: Well nourished, well developed, in no acute distress  HEENT: normal  Neck: no JVD, carotid bruits, or masses Cardiac: Irregularly irregular; no murmurs, rubs, or gallops,no edema  Respiratory:  clear to auscultation bilaterally, normal work of breathing GI: soft, nontender, nondistended, + BS MS: no deformity or atrophy  Skin: warm and dry, no rash Neuro:  Strength and sensation are intact Psych: euthymic mood, full affect   EKG:  EKG is ordered today. The ekg ordered today demonstrates atrial fibrillation with ventricular rate of 81 bpm.   Recent Labs: 03/26/2016: ALT 12; BUN 15; Creatinine, Ser 0.82; Hemoglobin 14.1; Platelets 211.0; Potassium 4.5; Sodium 141; TSH 1.34    Lipid Panel    Component Value Date/Time   CHOL 223 (H) 03/26/2016 1056   TRIG 151.0 (H) 03/26/2016 1056   HDL 50.00 03/26/2016 1056   CHOLHDL 4 03/26/2016 1056   VLDL 30.2 03/26/2016 1056   LDLCALC 143 (H) 03/26/2016 1056   LDLDIRECT 164.0 07/01/2014 1419      Wt Readings from Last 3 Encounters:  12/24/16 144 lb (65.3 kg)  11/01/16 145 lb 2 oz (65.8 kg)  09/20/16 149 lb 3.2 oz (67.7 kg)         ASSESSMENT AND PLAN:  1.   Chronic atrial fibrillation: Ventricular rate is well controlled on current dose of diltiazem extended release 120 mg once daily. She is tolerating anticoagulation with Xarelto. Laps last year were unremarkable.  2. Essential hypertension: Blood pressure is reasonably controlled on current medications.    Disposition:   FU with me in 1 year  Signed,  Lorine BearsMuhammad Miri Jose, MD  12/24/2016 12:58 PM    [facility]

## 2017-01-18 ENCOUNTER — Other Ambulatory Visit: Payer: Self-pay | Admitting: Cardiovascular Disease

## 2017-01-19 ENCOUNTER — Other Ambulatory Visit: Payer: Self-pay | Admitting: Internal Medicine

## 2017-02-26 ENCOUNTER — Other Ambulatory Visit: Payer: Self-pay

## 2017-02-26 MED ORDER — DILTIAZEM HCL ER 120 MG PO CP12: 120.0000 mg | ORAL_CAPSULE | Freq: Two times a day (BID) | ORAL | 0 refills | Status: DC

## 2017-03-14 ENCOUNTER — Other Ambulatory Visit: Payer: Self-pay | Admitting: *Deleted

## 2017-03-14 MED ORDER — RIVAROXABAN 20 MG PO TABS: 20.0000 mg | ORAL_TABLET | Freq: Every day | ORAL | 1 refills | Status: DC

## 2017-03-14 NOTE — Telephone Encounter (Signed)
Pharmacy requests ninety day rx. 

## 2017-03-18 ENCOUNTER — Telehealth: Payer: Self-pay | Admitting: Internal Medicine

## 2017-03-18 NOTE — Telephone Encounter (Signed)
No OV and no labs since 03/26/16, please advise can refill for 30 days til can reach patient and schedule FU, tried to call patient no answer and no voicemail.

## 2017-03-19 NOTE — Telephone Encounter (Signed)
Refill for 30 days only.  OFFICE VISIT NEEDED prior to any more refills 

## 2017-03-19 NOTE — Telephone Encounter (Signed)
Left message call office needs appointment.

## 2017-03-21 ENCOUNTER — Telehealth: Payer: Self-pay | Admitting: Internal Medicine

## 2017-03-21 DIAGNOSIS — Z961 Presence of intraocular lens: Secondary | ICD-10-CM | POA: Diagnosis not present

## 2017-03-21 MED ORDER — DILTIAZEM HCL ER 120 MG PO CP12: 120.0000 mg | ORAL_CAPSULE | Freq: Two times a day (BID) | ORAL | 0 refills | Status: DC

## 2017-03-21 NOTE — Telephone Encounter (Signed)
Message unclear  .  Need to know if medication is still available  From another company.  Reordered same medication since noone has checked with pharmacy

## 2017-03-21 NOTE — Telephone Encounter (Signed)
Pt called the pharmacy to get a refill on her diltiazem (CARDIZEM SR) 120 MG 12 hr capsule. Pharmacy told the pt that the company that they order that medication from no longer makes it. Please advise, thank you!  FYI - I scheduled pt for an appt on 11/13 @ 2:30

## 2017-03-21 NOTE — Telephone Encounter (Signed)
Please advise, thanks.

## 2017-03-22 ENCOUNTER — Telehealth: Payer: Self-pay

## 2017-03-22 MED ORDER — DILTIAZEM HCL ER COATED BEADS 240 MG PO TB24: 240.0000 mg | ORAL_TABLET | Freq: Every day | ORAL | 5 refills | Status: DC

## 2017-03-22 NOTE — Telephone Encounter (Signed)
Not available any more from any company.  Sorry for the confusion.

## 2017-03-22 NOTE — Telephone Encounter (Signed)
Once daily diltiazem 240mg   Sent to pharmacy

## 2017-03-22 NOTE — Telephone Encounter (Signed)
Pt called back stating per pharmacy that the medication is no longer made and not available from any company. Thank you!

## 2017-03-22 NOTE — Telephone Encounter (Signed)
Patient aware.

## 2017-03-25 MED ORDER — DILTIAZEM HCL ER COATED BEADS 120 MG PO CP24: 120.0000 mg | ORAL_CAPSULE | Freq: Every day | ORAL | 0 refills | Status: DC

## 2017-03-25 NOTE — Addendum Note (Signed)
Addended by: Larry SierrasAVIS, Fumie Fiallo L on: 03/25/2017 09:40 AM   Modules accepted: Orders

## 2017-03-25 NOTE — Telephone Encounter (Signed)
PT called about her medication. She wants to speak to Tullo's CMA. Pt states it is the wrong medication. Please call pt.

## 2017-03-25 NOTE — Telephone Encounter (Signed)
error 

## 2017-03-25 NOTE — Telephone Encounter (Addendum)
Sent in diltiazem 120 mg 24 hr capsule. Patient is aware.

## 2017-04-04 ENCOUNTER — Other Ambulatory Visit: Payer: Self-pay | Admitting: Internal Medicine

## 2017-04-04 NOTE — Telephone Encounter (Signed)
Last OV 03/26/16 and last labs please advise as to refill.

## 2017-04-05 ENCOUNTER — Telehealth: Payer: Self-pay

## 2017-04-05 MED ORDER — DILTIAZEM HCL ER COATED BEADS 120 MG PO CP24: 120.0000 mg | ORAL_CAPSULE | Freq: Every day | ORAL | 2 refills | Status: DC

## 2017-04-05 NOTE — Telephone Encounter (Signed)
refilled 

## 2017-04-08 MED ORDER — LISINOPRIL 40 MG PO TABS: 40.0000 mg | ORAL_TABLET | Freq: Two times a day (BID) | ORAL | 0 refills | Status: DC

## 2017-04-08 NOTE — Telephone Encounter (Signed)
Please advise 

## 2017-04-08 NOTE — Telephone Encounter (Signed)
Pt. Has an appointment for 04/16/17. She is out of her Lisinopril. A refill was sent to [facility] but not enough to cover her until this appointment.

## 2017-04-08 NOTE — Telephone Encounter (Signed)
Copied from CRM [phone]. Topic: Inquiry >> Apr 08, 2017  9:09 AM Yvonna Alanisobinson, Andra M wrote: Reason for CRM: Patient has a question concerning her medications. Patient has requested to speak with Dr. Melina Schoolsullo's Nurse. Please call patient ASAP this morning. Patient is very concerned. SHe would not give me further detail, wanting only to speak with the nurse.

## 2017-04-08 NOTE — Telephone Encounter (Signed)
30 day supply sent to last til appointment.

## 2017-04-16 ENCOUNTER — Ambulatory Visit: Payer: PPO | Admitting: Internal Medicine

## 2017-04-16 ENCOUNTER — Encounter: Payer: Self-pay | Admitting: Internal Medicine

## 2017-04-16 VITALS — BP 132/78 | HR 91 | Temp 98.2°F | Resp 15 | Ht 64.0 in | Wt 145.8 lb

## 2017-04-16 DIAGNOSIS — Z7901 Long term (current) use of anticoagulants: Secondary | ICD-10-CM

## 2017-04-16 DIAGNOSIS — E782 Mixed hyperlipidemia: Secondary | ICD-10-CM

## 2017-04-16 DIAGNOSIS — I10 Essential (primary) hypertension: Secondary | ICD-10-CM | POA: Diagnosis not present

## 2017-04-16 DIAGNOSIS — I481 Persistent atrial fibrillation: Secondary | ICD-10-CM

## 2017-04-16 DIAGNOSIS — I4819 Other persistent atrial fibrillation: Secondary | ICD-10-CM

## 2017-04-16 LAB — CBC WITH DIFFERENTIAL/PLATELET
Basophils Absolute: 0.1 10*3/uL (ref 0.0–0.1)
Basophils Relative: 0.8 % (ref 0.0–3.0)
EOS ABS: 0.2 10*3/uL (ref 0.0–0.7)
EOS PCT: 2.3 % (ref 0.0–5.0)
HCT: 42.8 % (ref 36.0–46.0)
HEMOGLOBIN: 14.2 g/dL (ref 12.0–15.0)
LYMPHS PCT: 29.4 % (ref 12.0–46.0)
Lymphs Abs: 2 10*3/uL (ref 0.7–4.0)
MCHC: 33.2 g/dL (ref 30.0–36.0)
MCV: 94.9 fl (ref 78.0–100.0)
MONO ABS: 0.4 10*3/uL (ref 0.1–1.0)
Monocytes Relative: 5.7 % (ref 3.0–12.0)
Neutro Abs: 4.2 10*3/uL (ref 1.4–7.7)
Neutrophils Relative %: 61.8 % (ref 43.0–77.0)
Platelets: 213 10*3/uL (ref 150.0–400.0)
RBC: 4.51 Mil/uL (ref 3.87–5.11)
RDW: 13.6 % (ref 11.5–15.5)
WBC: 6.8 10*3/uL (ref 4.0–10.5)

## 2017-04-16 LAB — COMPREHENSIVE METABOLIC PANEL
ALBUMIN: 4.3 g/dL (ref 3.5–5.2)
ALT: 14 U/L (ref 0–35)
AST: 17 U/L (ref 0–37)
Alkaline Phosphatase: 61 U/L (ref 39–117)
BUN: 13 mg/dL (ref 6–23)
CALCIUM: 9.9 mg/dL (ref 8.4–10.5)
CHLORIDE: 103 meq/L (ref 96–112)
CO2: 29 mEq/L (ref 19–32)
Creatinine, Ser: 0.82 mg/dL (ref 0.40–1.20)
GFR: 70.57 mL/min (ref >60.00)
Glucose, Bld: 102 mg/dL — ABNORMAL HIGH (ref 70–99)
POTASSIUM: 4.3 meq/L (ref 3.5–5.1)
Sodium: 138 mEq/L (ref 135–145)
Total Bilirubin: 0.7 mg/dL (ref 0.2–1.2)
Total Protein: 7.2 g/dL (ref 6.0–8.3)

## 2017-04-16 LAB — LIPID PANEL
CHOLESTEROL: 228 mg/dL — AB (ref 0–200)
HDL: 49.1 mg/dL (ref >39.00)
LDL CALC: 144 mg/dL — AB (ref 0–99)
NonHDL: 179.34
TRIGLYCERIDES: 178 mg/dL — AB (ref 0.0–149.0)
Total CHOL/HDL Ratio: 5
VLDL: 35.6 mg/dL (ref 0.0–40.0)

## 2017-04-16 LAB — MICROALBUMIN / CREATININE URINE RATIO
Creatinine,U: 38.3 mg/dL
MICROALB UR: 0.8 mg/dL (ref 0.0–1.9)
Microalb Creat Ratio: 2 mg/g (ref 0.0–30.0)

## 2017-04-16 LAB — TSH: TSH: 1.91 u[IU]/mL (ref 0.35–4.50)

## 2017-04-16 MED ORDER — ZOSTER VAC RECOMB ADJUVANTED 50 MCG/0.5ML IM SUSR: 0.5000 mL | Freq: Once | INTRAMUSCULAR | 1 refills | Status: AC

## 2017-04-16 NOTE — Patient Instructions (Addendum)
For your leg cramps , you can also try these "home remedies"  1-2 ounces Tonic water (availabele at grocery stores , [place]  dry and [employer] )  One teaspoon of  mustard (any type)     Regarding your pulse:  Goal is to keep heart rate below 80 but over 55.  If your home readings are well above that.  We can increase the diltiazem dose from 120 mg daily to 180 mg daily    The ShingRx vaccine is now available in local pharmacies and is much more protective thant Zostavaxs,  It is therefore ADVISED for all interested adults over 50 to prevent shingles    Chronic Venous Insufficiency Chronic venous insufficiency, also called venous stasis, is a condition that prevents blood from being pumped effectively through the veins in your legs. Blood may no longer be pumped effectively from the legs back to the heart. This condition can range from mild to severe. With proper treatment, you should be able to continue with an active life. What are the causes? Chronic venous insufficiency occurs when the vein walls become stretched, weakened, or damaged, or when valves within the vein are damaged. Some common causes of this include:  High blood pressure inside the veins (venous hypertension).  Increased blood pressure in the leg veins from long periods of sitting or standing.  A blood clot that blocks blood flow in a vein (deep vein thrombosis, DVT).  Inflammation of a vein (phlebitis) that causes a blood clot to form.  Tumors in the pelvis that cause blood to back up.  What increases the risk? The following factors may make you more likely to develop this condition:  Having a family history of this condition.  Obesity.  Pregnancy.  Living without enough physical activity or exercise (sedentary lifestyle).  Smoking.  Having a job that requires long periods of standing or sitting in one place.  Being a certain age. Women in their 69s and 35s and men in their 73s are more likely to  develop this condition.  What are the signs or symptoms? Symptoms of this condition include:  Veins that are enlarged, bulging, or twisted (varicose veins).  Skin breakdown or ulcers.  Reddened or discolored skin on the front of the leg.  Brown, smooth, tight, and painful skin just above the ankle, usually on the inside of the leg (lipodermatosclerosis).  Swelling.  How is this diagnosed? This condition may be diagnosed based on:  Your medical history.  A physical exam.  Tests, such as: ? A procedure that creates an image of a blood vessel and nearby organs and provides information about blood flow through the blood vessel (duplex ultrasound). ? A procedure that tests blood flow (plethysmography). ? A procedure to look at the veins using X-ray and dye (venogram).  How is this treated? The goals of treatment are to help you return to an active life and to minimize pain or disability. Treatment depends on the severity of your condition, and it may include:  Wearing compression stockings. These can help relieve symptoms and help prevent your condition from getting worse. However, they do not cure the condition.  Sclerotherapy. This is a procedure involving an injection of a material that "dissolves" damaged veins.  Surgery. This may involve: ? Removing a diseased vein (vein stripping). ? Cutting off blood flow through the vein (laser ablation surgery). ? Repairing a valve.  Follow these instructions at home:  Wear compression stockings as told by your health care provider.  These stockings help to prevent blood clots and reduce swelling in your legs.  Take over-the-counter and prescription medicines only as told by your health care provider.  Stay active by exercising, walking, or doing different activities. Ask your health care provider what activities are safe for you and how much exercise you need.  Drink enough fluid to keep your urine clear or pale yellow.  Do not  use any products that contain nicotine or tobacco, such as cigarettes and e-cigarettes. If you need help quitting, ask your health care provider.  Keep all follow-up visits as told by your health care provider. This is important. Contact a health care provider if:  You have redness, swelling, or more pain in the affected area.  You see a red streak or line that extends up or down from the affected area.  You have skin breakdown or a loss of skin in the affected area, even if the breakdown is small.  You get an injury in the affected area. Get help right away if:  You get an injury and an open wound in the affected area.  You have severe pain that does not get better with medicine.  You have sudden numbness or weakness in the foot or ankle below the affected area, or you have trouble moving your foot or ankle.  You have a fever and you have worse or persistent symptoms.  You have chest pain.  You have shortness of breath. Summary  Chronic venous insufficiency, also called venous stasis, is a condition that prevents blood from being pumped effectively through the veins in your legs.  Chronic venous insufficiency occurs when the vein walls become stretched, weakened, or damaged, or when valves within the vein are damaged.  Treatment for this condition depends on how severe your condition is, and it may involve wearing compression stockings or having a procedure.  Make sure you stay active by exercising, walking, or doing different activities. Ask your health care provider what activities are safe for you and how much exercise you need. This information is not intended to replace advice given to you by your health care provider. Make sure you discuss any questions you have with your health care provider. Document Released: 09/24/2006 Document Revised: 04/09/2016 Document Reviewed: 04/09/2016 Elsevier Interactive Patient Education  2017 [employer]  Chronic Venous  Insufficiency Chronic venous insufficiency, also called venous stasis, is a condition that prevents blood from being pumped effectively through the veins in your legs. Blood may no longer be pumped effectively from the legs back to the heart. This condition can range from mild to severe. With proper treatment, you should be able to continue with an active life. What are the causes? Chronic venous insufficiency occurs when the vein walls become stretched, weakened, or damaged, or when valves within the vein are damaged. Some common causes of this include:  High blood pressure inside the veins (venous hypertension).  Increased blood pressure in the leg veins from long periods of sitting or standing.  A blood clot that blocks blood flow in a vein (deep vein thrombosis, DVT).  Inflammation of a vein (phlebitis) that causes a blood clot to form.  Tumors in the pelvis that cause blood to back up.  What increases the risk? The following factors may make you more likely to develop this condition:  Having a family history of this condition.  Obesity.  Pregnancy.  Living without enough physical activity or exercise (sedentary lifestyle).  Smoking.  Having a job that requires  long periods of standing or sitting in one place.  Being a certain age. Women in their 61s and 27s and men in their 76s are more likely to develop this condition.  What are the signs or symptoms? Symptoms of this condition include:  Veins that are enlarged, bulging, or twisted (varicose veins).  Skin breakdown or ulcers.  Reddened or discolored skin on the front of the leg.  Brown, smooth, tight, and painful skin just above the ankle, usually on the inside of the leg (lipodermatosclerosis).  Swelling.  How is this diagnosed? This condition may be diagnosed based on:  Your medical history.  A physical exam.  Tests, such as: ? A procedure that creates an image of a blood vessel and nearby organs and  provides information about blood flow through the blood vessel (duplex ultrasound). ? A procedure that tests blood flow (plethysmography). ? A procedure to look at the veins using X-ray and dye (venogram).  How is this treated? The goals of treatment are to help you return to an active life and to minimize pain or disability. Treatment depends on the severity of your condition, and it may include:  Wearing compression stockings. These can help relieve symptoms and help prevent your condition from getting worse. However, they do not cure the condition.  Sclerotherapy. This is a procedure involving an injection of a material that "dissolves" damaged veins.  Surgery. This may involve: ? Removing a diseased vein (vein stripping). ? Cutting off blood flow through the vein (laser ablation surgery). ? Repairing a valve.  Follow these instructions at home:  Wear compression stockings as told by your health care provider. These stockings help to prevent blood clots and reduce swelling in your legs.  Take over-the-counter and prescription medicines only as told by your health care provider.  Stay active by exercising, walking, or doing different activities. Ask your health care provider what activities are safe for you and how much exercise you need.  Drink enough fluid to keep your urine clear or pale yellow.  Do not use any products that contain nicotine or tobacco, such as cigarettes and e-cigarettes. If you need help quitting, ask your health care provider.  Keep all follow-up visits as told by your health care provider. This is important. Contact a health care provider if:  You have redness, swelling, or more pain in the affected area.  You see a red streak or line that extends up or down from the affected area.  You have skin breakdown or a loss of skin in the affected area, even if the breakdown is small.  You get an injury in the affected area. Get help right away if:  You get  an injury and an open wound in the affected area.  You have severe pain that does not get better with medicine.  You have sudden numbness or weakness in the foot or ankle below the affected area, or you have trouble moving your foot or ankle.  You have a fever and you have worse or persistent symptoms.  You have chest pain.  You have shortness of breath. Summary  Chronic venous insufficiency, also called venous stasis, is a condition that prevents blood from being pumped effectively through the veins in your legs.  Chronic venous insufficiency occurs when the vein walls become stretched, weakened, or damaged, or when valves within the vein are damaged.  Treatment for this condition depends on how severe your condition is, and it may involve wearing compression stockings or  having a procedure.  Make sure you stay active by exercising, walking, or doing different activities. Ask your health care provider what activities are safe for you and how much exercise you need. This information is not intended to replace advice given to you by your health care provider. Make sure you discuss any questions you have with your health care provider. Document Released: 09/24/2006 Document Revised: 04/09/2016 Document Reviewed: 04/09/2016 Elsevier Interactive Patient Education  2017 [employer]

## 2017-04-16 NOTE — Progress Notes (Signed)
Subjective:  Patient ID: Laurie [name], female    DOB: [birth year]  Age: 83 y.o. MRN: [id number]  CC: The primary encounter diagnosis was Mixed hyperlipidemia. Diagnoses of Essential hypertension, Long term current use of anticoagulant therapy, and Persistent atrial fibrillation (HCC) were also pertinent to this visit.  HPI Laurie [name] presents for follow up on hypertension , hyperlipidemia and atrial fib managed with diltiazem and xarelto  Last seen Oct 2017.  Seen by margaret in April  For LE asymmetric swelling Baker's Cyst noted.    Last meal today was breakfast.  Cc: leg cramps occurring at night,  Not with ambulation .  Eating banana  Does not want any more mammograms   Hypertension: patient checks blood pressure twice weekly at home.  Readings have been for the most part < 145/80 at rest . Patient is following a reduced salt diet most days and is taking medications as prescribedHome readings are 145/80 or less      Outpatient Medications Prior to Visit  Medication Sig Dispense Refill  . Calcium-Vitamin D (CALCET PO) Take 2 tablets by mouth daily.     . chlorpheniramine (CHLOR-TRIMETON) 4 MG tablet Take 4 mg by mouth 2 (two) times daily as needed.    . diltiazem (CARDIZEM CD) 120 MG 24 hr capsule Take 1 capsule (120 mg total) by mouth daily. 30 capsule 2  . fish oil-omega-3 fatty acids 1000 MG capsule Take 1 g by mouth daily.     . fluticasone (FLONASE) 50 MCG/ACT nasal spray Place 2 sprays into both nostrils daily. 16 g 6  . lisinopril (PRINIVIL,ZESTRIL) 40 MG tablet Take 1 tablet (40 mg total) 2 (two) times daily by mouth. 60 tablet 0  . rivaroxaban (XARELTO) 20 MG TABS tablet Take 1 tablet (20 mg total) by mouth daily. 90 tablet 1  . Vitamin D, Cholecalciferol, 400 units TABS Take by mouth.     No facility-administered medications prior to visit.     Review of Systems;  Patient denies headache, fevers, malaise, unintentional weight loss, skin rash, eye pain, sinus  congestion and sinus pain, sore throat, dysphagia,  hemoptysis , cough, dyspnea, wheezing, chest pain, palpitations, orthopnea, edema, abdominal pain, nausea, melena, diarrhea, constipation, flank pain, dysuria, hematuria, urinary  Frequency, nocturia, numbness, tingling, seizures,  Focal weakness, Loss of consciousness,  Tremor, insomnia, depression, anxiety, and suicidal ideation.      Objective:  BP 132/78 (BP Location: Left Arm, Patient Position: Sitting, Cuff Size: Normal)   Pulse 91   Temp 98.2 F (36.8 C) (Oral)   Resp 15   Ht 5\' 4"  (1.626 m)   Wt 145 lb 12.8 oz (66.1 kg)   SpO2 98%   BMI 25.03 kg/m   BP Readings from Last 3 Encounters:  04/16/17 132/78  12/24/16 140/64  11/01/16 132/62    Wt Readings from Last 3 Encounters:  04/16/17 145 lb 12.8 oz (66.1 kg)  12/24/16 144 lb (65.3 kg)  11/01/16 145 lb 2 oz (65.8 kg)    General appearance: alert, cooperative and appears stated age Ears: normal TM's and external ear canals both ears Throat: lips, mucosa, and tongue normal; teeth and gums normal Neck: no adenopathy, no carotid bruit, supple, symmetrical, trachea midline and thyroid not enlarged, symmetric, no tenderness/mass/nodules Back: symmetric, no curvature. ROM normal. No CVA tenderness. Lungs: clear to auscultation bilaterally Heart: regular rate and rhythm, S1, S2 normal, no murmur, click, rub or gallop Abdomen: soft, non-tender; bowel sounds normal; no masses,  no  organomegaly Pulses: 2+ and symmetric Skin: Skin color, texture, turgor normal. No rashes or lesions Lymph nodes: Cervical, supraclavicular, and axillary nodes normal.  No results found for: HGBA1C  Lab Results  Component Value Date   CREATININE 0.82 04/16/2017   CREATININE 0.82 03/26/2016   CREATININE 0.80 01/06/2015    Lab Results  Component Value Date   WBC 6.8 04/16/2017   HGB 14.2 04/16/2017   HCT 42.8 04/16/2017   PLT 213.0 04/16/2017   GLUCOSE 102 (H) 04/16/2017   CHOL 228 (H)  04/16/2017   TRIG 178.0 (H) 04/16/2017   HDL 49.10 04/16/2017   LDLDIRECT 164.0 07/01/2014   LDLCALC 144 (H) 04/16/2017   ALT 14 04/16/2017   AST 17 04/16/2017   NA 138 04/16/2017   K 4.3 04/16/2017   CL 103 04/16/2017   CREATININE 0.82 04/16/2017   BUN 13 04/16/2017   CO2 29 04/16/2017   TSH 1.91 04/16/2017   INR 1.0 11/16/2011   MICROALBUR 0.8 04/16/2017    [place] Venous Img Lower Unilateral Left  Result Date: 09/20/2016 CLINICAL DATA:  Left leg swelling for 1.5 weeks.  No known injury. EXAM: LEFT LOWER EXTREMITY VENOUS DOPPLER ULTRASOUND TECHNIQUE: Gray-scale sonography with graded compression, as well as color Doppler and duplex ultrasound were performed to evaluate the lower extremity deep venous systems from the level of the common femoral vein and including the common femoral, femoral, profunda femoral, popliteal and calf veins including the posterior tibial, peroneal and gastrocnemius veins when visible. The superficial great saphenous vein was also interrogated. Spectral Doppler was utilized to evaluate flow at rest and with distal augmentation maneuvers in the common femoral, femoral and popliteal veins. COMPARISON:  None. FINDINGS: Contralateral Common Femoral Vein: Respiratory phasicity is normal and symmetric with the symptomatic side. No evidence of thrombus. Normal compressibility. Common Femoral Vein: No evidence of thrombus. Normal compressibility, respiratory phasicity and response to augmentation. Saphenofemoral Junction: No evidence of thrombus. Normal compressibility and flow on color Doppler imaging. Profunda Femoral Vein: No evidence of thrombus. Normal compressibility and flow on color Doppler imaging. Femoral Vein: No evidence of thrombus. Normal compressibility, respiratory phasicity and response to augmentation. Popliteal Vein: No evidence of thrombus. Normal compressibility, respiratory phasicity and response to augmentation. Calf Veins: No evidence of thrombus. Normal  compressibility and flow on color Doppler imaging. Superficial Great Saphenous Vein: No evidence of thrombus. Normal compressibility and flow on color Doppler imaging. Venous Reflux:  None. Other Findings: Baker's cyst with some septations measures 5.5 x 1.6 x 3.6 cm. IMPRESSION: Negative for deep venous thrombosis. Baker's cyst. Electronically Signed   By: Drusilla Kannerhomas  Dalessio M.D.   On: 09/20/2016 15:35    Assessment & Plan:   Problem List Items Addressed This Visit    Atrial fibrillation (HCC)    Has not tolerated Cardizem CD 180 mg due to recurrent feelings of palpitations and nervousness. .  Continue Cardizem 120 mg q 12 hours.   Continue xarelto for embolic stroke risk mitigation       Hyperlipidemia - Primary    Mild, no indication for treatment given her age and absence of diabetes.  Lab Results  Component Value Date   CHOL 228 (H) 04/16/2017   HDL 49.10 04/16/2017   LDLCALC 144 (H) 04/16/2017   LDLDIRECT 164.0 07/01/2014   TRIG 178.0 (H) 04/16/2017   CHOLHDL 5 04/16/2017         Relevant Orders   TSH (Completed)   Lipid panel (Completed)   Hypertension    Well  controlled for age on current regimen. Renal function stable, no changes today.  Lab Results  Component Value Date   CREATININE 0.82 04/16/2017   Lab Results  Component Value Date   NA 138 04/16/2017   K 4.3 04/16/2017   CL 103 04/16/2017   CO2 29 04/16/2017         Relevant Orders   Microalbumin / creatinine urine ratio (Completed)   Comprehensive metabolic panel (Completed)   Long term current use of anticoagulant therapy    CBC is normal and she has had no bleeding episodes. Continue Xarelto.  Lab Results  Component Value Date   WBC 6.8 04/16/2017   HGB 14.2 04/16/2017   HCT 42.8 04/16/2017   MCV 94.9 04/16/2017   PLT 213.0 04/16/2017         Relevant Orders   CBC with Differential/Platelet (Completed)     A total of 40 minutes was spent with patient more than half of which was spent in  counseling patient on the above mentioned issues , reviewing and explaining recent labs and imaging studies done, and coordination of care.  I am having Laurie [name] start on Zoster Vaccine Adjuvanted. I am also having her maintain her fish oil-omega-3 fatty acids, chlorpheniramine, Calcium-Vitamin D (CALCET PO), Vitamin D (Cholecalciferol), fluticasone, rivaroxaban, diltiazem, and lisinopril.  Meds ordered this encounter  Medications  . Zoster Vaccine Adjuvanted [facility](SHINGRIX) injection    Sig: Inject 0.5 mLs once for 1 dose into the muscle.    Dispense:  1 each    Refill:  1    There are no discontinued medications.  Follow-up: No Follow-up on file.   Sherlene Shamseresa L Christapher Gillian, MD

## 2017-04-18 NOTE — Assessment & Plan Note (Addendum)
Well controlled for age on current regimen. Renal function stable, no changes today.  Lab Results  Component Value Date   CREATININE 0.82 04/16/2017   Lab Results  Component Value Date   NA 138 04/16/2017   K 4.3 04/16/2017   CL 103 04/16/2017   CO2 29 04/16/2017

## 2017-04-18 NOTE — Assessment & Plan Note (Signed)
Has not tolerated Cardizem CD 180 mg due to recurrent feelings of palpitations and nervousness. .  Continue Cardizem 120 mg q 12 hours.   Continue xarelto for embolic stroke risk mitigation

## 2017-04-18 NOTE — Assessment & Plan Note (Signed)
CBC is normal and she has had no bleeding episodes. Continue Xarelto.  Lab Results  Component Value Date   WBC 6.8 04/16/2017   HGB 14.2 04/16/2017   HCT 42.8 04/16/2017   MCV 94.9 04/16/2017   PLT 213.0 04/16/2017

## 2017-04-18 NOTE — Assessment & Plan Note (Signed)
Mild, no indication for treatment given her age and absence of diabetes.  Lab Results  Component Value Date   CHOL 228 (H) 04/16/2017   HDL 49.10 04/16/2017   LDLCALC 144 (H) 04/16/2017   LDLDIRECT 164.0 07/01/2014   TRIG 178.0 (H) 04/16/2017   CHOLHDL 5 04/16/2017

## 2017-05-09 ENCOUNTER — Other Ambulatory Visit: Payer: Self-pay | Admitting: Internal Medicine

## 2017-05-09 ENCOUNTER — Ambulatory Visit: Payer: Self-pay

## 2017-05-09 NOTE — Telephone Encounter (Signed)
Phone call from pt. With c/o burning and frequency with urination. Reported the symptoms started yesterday.  Denied fever.  Denied any other symptoms at this time.  Per protocol, appt. [facility]. [facility] as no avail. appt. with PCP.  Pt. agrees with plan.  Care advice given per protocol.  Verb. Understanding.    Reason for Disposition . Urinating more frequently than usual (i.e., frequency)  Answer Assessment - Initial Assessment Questions 1. SYMPTOM: "What's the main symptom you're concerned about?" (e.g., frequency, incontinence)     Burning with urination, frequency 2. ONSET: "When did the  ________  start?"     yesterday 3. PAIN: "Is there any pain?" If so, ask: "How bad is it?" (Scale: 1-10; mild, moderate, severe)     No pain 4. CAUSE: "What do you think is causing the symptoms?"     Thinks she has a UTI  5. OTHER SYMPTOMS: "Do you have any other symptoms?" (e.g., fever, flank pain, blood in urine, pain with urination)     No other symptoms  6. PREGNANCY: "Is there any chance you are pregnant?" "When was your last menstrual period?"     no  Protocols used: [facility]

## 2017-05-10 ENCOUNTER — Ambulatory Visit: Payer: PPO | Admitting: Family Medicine

## 2017-05-10 ENCOUNTER — Other Ambulatory Visit: Payer: Self-pay

## 2017-05-10 ENCOUNTER — Encounter: Payer: Self-pay | Admitting: Family Medicine

## 2017-05-10 VITALS — BP 132/80 | HR 80 | Temp 98.2°F | Wt 148.0 lb

## 2017-05-10 DIAGNOSIS — N3001 Acute cystitis with hematuria: Secondary | ICD-10-CM | POA: Diagnosis not present

## 2017-05-10 DIAGNOSIS — R300 Dysuria: Secondary | ICD-10-CM

## 2017-05-10 LAB — POC URINALSYSI DIPSTICK (AUTOMATED)
Bilirubin, UA: NEGATIVE
Glucose, UA: NEGATIVE
KETONES UA: NEGATIVE
LEUKOCYTES UA: NEGATIVE
NITRITE UA: NEGATIVE
PH UA: 5.5 (ref 5.0–8.0)
Protein, UA: NEGATIVE
Spec Grav, UA: 1.025 (ref 1.010–1.025)
UROBILINOGEN UA: 0.2 U/dL

## 2017-05-10 MED ORDER — SULFAMETHOXAZOLE-TRIMETHOPRIM 800-160 MG PO TABS: 1.0000 | ORAL_TABLET | Freq: Two times a day (BID) | ORAL | 0 refills | Status: DC

## 2017-05-10 MED ORDER — LISINOPRIL 40 MG PO TABS: 40.0000 mg | ORAL_TABLET | Freq: Two times a day (BID) | ORAL | 1 refills | Status: DC

## 2017-05-10 NOTE — Progress Notes (Signed)
BP 132/80 (BP Location: Left Arm, Patient Position: Sitting, Cuff Size: Normal)   Pulse 80   Temp 98.2 F (36.8 C) (Oral)   Wt 148 lb (67.1 kg)   SpO2 96%   BMI 25.40 kg/m    CC: dysuria Subjective:    Patient ID: Laurie [name], female    DOB: 16-Oct-1930, 83 y.o.   MRN: [id number]  HPI: Laurie [name] is a 83 y.o. female presenting on 05/10/2017 for Dysuria (Started 2 days ago. Drinking cranberry juice and increased water. Sxs have improved today)   2d h/o dysuria, urgency and frequency.  No fevers/chills, hematuria, nausea/vomiting, abd pain, flank pain.  She has increased cranberry juice and water intake. Today has noticed improvement.   She is on xarelto for afib.   Last UTI was several years ago.  No h/o kidney stones.  No recent abx use.  She has recently started drinking some tonic water.   Relevant past medical, surgical, family and social history reviewed and updated as indicated. Interim medical history since our last visit reviewed. Allergies and medications reviewed and updated. Outpatient Medications Prior to Visit  Medication Sig Dispense Refill  . Calcium-Vitamin D (CALCET PO) Take 2 tablets by mouth daily.     . chlorpheniramine (CHLOR-TRIMETON) 4 MG tablet Take 4 mg by mouth 2 (two) times daily as needed.    . diltiazem (CARDIZEM CD) 120 MG 24 hr capsule Take 1 capsule (120 mg total) by mouth daily. 30 capsule 2  . fish oil-omega-3 fatty acids 1000 MG capsule Take 1 g by mouth daily.     . fluticasone (FLONASE) 50 MCG/ACT nasal spray Place 2 sprays into both nostrils daily. 16 g 6  . lisinopril (PRINIVIL,ZESTRIL) 40 MG tablet Take 1 tablet (40 mg total) by mouth 2 (two) times daily. 180 tablet 1  . rivaroxaban (XARELTO) 20 MG TABS tablet Take 1 tablet (20 mg total) by mouth daily. 90 tablet 1  . Vitamin D, Cholecalciferol, 400 units TABS Take by mouth.     No facility-administered medications prior to visit.      Per HPI unless specifically indicated  in ROS section below Review of Systems     Objective:    BP 132/80 (BP Location: Left Arm, Patient Position: Sitting, Cuff Size: Normal)   Pulse 80   Temp 98.2 F (36.8 C) (Oral)   Wt 148 lb (67.1 kg)   SpO2 96%   BMI 25.40 kg/m   Wt Readings from Last 3 Encounters:  05/10/17 148 lb (67.1 kg)  04/16/17 145 lb 12.8 oz (66.1 kg)  12/24/16 144 lb (65.3 kg)    Physical Exam  Constitutional: She appears well-developed and well-nourished. No distress.  HENT:  Mouth/Throat: Oropharynx is clear and moist. No oropharyngeal exudate.  Cardiovascular: Normal rate, regular rhythm, normal heart sounds and intact distal pulses.  No murmur heard. Pulmonary/Chest: Effort normal and breath sounds normal. No respiratory distress. She has no wheezes. She has no rales.  Abdominal: Soft. Normal appearance and bowel sounds are normal. She exhibits no distension and no mass. There is no tenderness. There is no rigidity, no rebound, no guarding, no CVA tenderness and negative Murphy's sign.  Musculoskeletal: She exhibits no edema.  Psychiatric: She has a normal mood and affect.  Nursing note and vitals reviewed.  Results for orders placed or performed in visit on 05/10/17  POCT Urinalysis Dipstick (Automated)  Result Value Ref Range   Color, UA yellow    Clarity, UA clear  Glucose, UA negative    Bilirubin, UA negative    Ketones, UA negaive    Spec Grav, UA 1.025 1.010 - 1.025   Blood, UA 1+    pH, UA 5.5 5.0 - 8.0   Protein, UA negative    Urobilinogen, UA 0.2 0.2 or 1.0 E.U./dL   Nitrite, UA negative    Leukocytes, UA Negative Negative      Assessment & Plan:   Problem List Items Addressed This Visit    Acute cystitis with hematuria - Primary    Symptoms consistent with UTI, UA benign but micro showing WBC/HPF. Treat with bactrim DS 3d course.  Push fluids and rest. UCx sent.  Update if not improving with treatment.       Relevant Orders   Urine Culture    Other Visit  Diagnoses    Dysuria       Relevant Orders   POCT Urinalysis Dipstick (Automated) (Completed)       Follow up plan: Return if symptoms worsen or fail to improve.  Eustaquio BoydenJavier Raylyn Speckman, MD

## 2017-05-10 NOTE — Patient Instructions (Addendum)
I do think you have urine infection - treat with bactrim 3d course.  Push fluids and rest. May continue cranberry juice.  Let us know if not improving with treatment.  Culture sent.   Urinary Tract Infection, Adult A urinary tract infection (UTI) is an infection of any part of the urinary tract. The urinary tract includes the:  Kidneys.  Ureters.  Bladder.  Urethra.  These organs make, store, and get rid of pee (urine) in the body. Follow these instructions at home:  Take over-the-counter and prescription medicines only as told by your doctor.  If you were prescribed an antibiotic medicine, take it as told by your doctor. Do not stop taking the antibiotic even if you start to feel better.  Avoid the following drinks: ? Alcohol. ? Caffeine. ? Tea. ? Carbonated drinks.  Drink enough fluid to keep your pee clear or pale yellow.  Keep all follow-up visits as told by your doctor. This is important.  Make sure to: ? Empty your bladder often and completely. Do not to hold pee for long periods of time. ? Empty your bladder before and after sex. ? Wipe from front to back after a bowel movement if you are female. Use each tissue one time when you wipe. Contact a doctor if:  You have back pain.  You have a fever.  You feel sick to your stomach (nauseous).  You throw up (vomit).  Your symptoms do not get better after 3 days.  Your symptoms go away and then come back. Get help right away if:  You have very bad back pain.  You have very bad lower belly (abdominal) pain.  You are throwing up and cannot keep down any medicines or water. This information is not intended to replace advice given to you by your health care provider. Make sure you discuss any questions you have with your health care provider. Document Released: 11/07/2007 Document Revised: 10/27/2015 Document Reviewed: 04/11/2015 Elsevier Interactive Patient Education  [employer]2018 [employer]

## 2017-05-10 NOTE — Assessment & Plan Note (Addendum)
Symptoms consistent with UTI, UA benign but micro showing WBC/HPF. Treat with bactrim DS 3d course.  Push fluids and rest. UCx sent.  Update if not improving with treatment.

## 2017-05-11 LAB — URINE CULTURE
MICRO NUMBER:: 81379666
SPECIMEN QUALITY:: ADEQUATE

## 2017-06-03 ENCOUNTER — Telehealth: Payer: Self-pay | Admitting: Cardiovascular Disease

## 2017-06-03 MED ORDER — RIVAROXABAN 20 MG PO TABS: 20.0000 mg | ORAL_TABLET | Freq: Every day | ORAL | 3 refills | Status: DC

## 2017-06-03 NOTE — Telephone Encounter (Signed)
Xarelto refilled.

## 2017-06-03 NOTE — Telephone Encounter (Signed)
Please advise for refill, Thanks !  

## 2017-06-03 NOTE — Telephone Encounter (Signed)
°*  STAT* If patient is at the pharmacy, call can be transferred to refill team.   1. Which medications need to be refilled? (please list name of each medication and dose if known) xarelto   2. Which pharmacy/location (including street and city if local pharmacy) is medication to be sent to? 20 mg po q day  3. Do they need a 30 day or 90 day supply? 10  Patient cost going down in new year pharmacy will not fill partial rx without a new rx from Doctor

## 2017-08-08 ENCOUNTER — Other Ambulatory Visit: Payer: Self-pay | Admitting: Internal Medicine

## 2017-08-30 ENCOUNTER — Ambulatory Visit: Payer: PPO

## 2017-09-23 ENCOUNTER — Encounter: Payer: Self-pay | Admitting: Internal Medicine

## 2017-09-23 ENCOUNTER — Ambulatory Visit (INDEPENDENT_AMBULATORY_CARE_PROVIDER_SITE_OTHER): Payer: PPO | Admitting: Internal Medicine

## 2017-09-23 VITALS — BP 134/70 | HR 91 | Temp 98.0°F | Resp 14 | Ht 64.0 in | Wt 146.6 lb

## 2017-09-23 DIAGNOSIS — E782 Mixed hyperlipidemia: Secondary | ICD-10-CM

## 2017-09-23 DIAGNOSIS — I10 Essential (primary) hypertension: Secondary | ICD-10-CM | POA: Diagnosis not present

## 2017-09-23 DIAGNOSIS — J301 Allergic rhinitis due to pollen: Secondary | ICD-10-CM | POA: Diagnosis not present

## 2017-09-23 DIAGNOSIS — Z0000 Encounter for general adult medical examination without abnormal findings: Secondary | ICD-10-CM

## 2017-09-23 MED ORDER — BENZONATATE 100 MG PO CAPS: 100.0000 mg | ORAL_CAPSULE | Freq: Two times a day (BID) | ORAL | 0 refills | Status: DC | PRN

## 2017-09-23 MED ORDER — ZOSTER VAC RECOMB ADJUVANTED 50 MCG/0.5ML IM SUSR: 0.5000 mL | Freq: Once | INTRAMUSCULAR | 1 refills | Status: AC

## 2017-09-23 NOTE — Progress Notes (Signed)
Patient ID: Laurie [name], female    DOB: [birth year]  Age: 84 y.o. MRN: [id number]  The patient is here for annual  Preventive examination and management of other chronic and acute problems.   The risk factors are reflected in the social history.  The roster of all physicians providing medical care to patient - is listed in the Snapshot section of the chart.  Activities of daily living:  The patient is 100% independent in all ADLs: dressing, toileting, feeding as well as independent mobility  Home safety : The patient has smoke detectors in the home. They wear seatbelts.  There are no firearms at home. There is no violence in the home.   There is no risks for hepatitis, STDs or HIV. There is no   history of blood transfusion. They have no travel history to infectious disease endemic areas of the world.  The patient has seen their dentist in the last six month. She has seen their eye doctor in the last year. She has no  hearing difficulty with regard to whispered voices She has deferred audiologic testing in the last year.  They do not  have excessive sun exposure. Discussed the need for sun protection: hats, long sleeves and use of sunscreen if there is significant sun exposure.   Diet: the importance of a healthy diet is discussed. They do have a healthy diet.  The benefits of regular aerobic exercise were discussed. She walks 4 times per week ,  20 minutes.   Depression screen: there are no signs or vegative symptoms of depression- irritability, change in appetite, anhedonia, sadness/tearfullness.  Cognitive assessment: the patient manages all their financial and personal affairs and is actively engaged. They could relate day,date,year and events; recalled 2/3 objects at 3 minutes; performed clock-face test normally.  The following portions of the patient's history were reviewed and updated as appropriate: allergies, current medications, past family history, past medical history,  past  surgical history, past social history  and problem list.  Visual acuity was not assessed per patient preference since she has regular follow up with her ophthalmologist. Hearing and body mass index were assessed and reviewed.   During the course of the visit the patient was educated and counseled about appropriate screening and preventive services including : fall prevention , diabetes screening, nutrition counseling, colorectal cancer screening, and recommended immunizations.    CC: The primary encounter diagnosis was Essential hypertension. Diagnoses of Mixed hyperlipidemia, Encounter for preventive health examination, and Seasonal allergic rhinitis due to pollen were also pertinent to this visit.  6 months follow up on hypertension,  Atrial  fibrillation and hyperlipidemia .Patient is taking her medications as prescribed and notes no adverse effects.  Home BP readings have been done about once per week and are  generally < 130/80 .  She is avoiding added salt in her diet and walking regularly about 3 times per week for exercise  .  She has had a nonproductive Cough for the last week ,  Has been taking taking zyrtec   History Laurie [name] has a past medical history of Arrhythmia and Hypertension.   She has a past surgical history that includes Tonsillectomy.   Her family history includes Anemia in her mother; Heart disease (age of onset: 6272) in her father; Kidney disease in her mother.She reports that she has never smoked. She has never used smokeless tobacco. She reports that she does not drink alcohol or use drugs.  Outpatient Medications Prior to Visit  Medication Sig  Dispense Refill  . Calcium-Vitamin D (CALCET PO) Take 2 tablets by mouth daily.     . chlorpheniramine (CHLOR-TRIMETON) 4 MG tablet Take 4 mg by mouth 2 (two) times daily as needed.    . diltiazem (CARDIZEM CD) 120 MG 24 hr capsule Take 1 capsule (120 mg total) by mouth daily. 30 capsule 2  . fish oil-omega-3 fatty acids 1000 MG  capsule Take 1 g by mouth daily.     Marland Kitchen lisinopril (PRINIVIL,ZESTRIL) 40 MG tablet Take 1 tablet (40 mg total) by mouth 2 (two) times daily. 180 tablet 1  . rivaroxaban (XARELTO) 20 MG TABS tablet Take 1 tablet (20 mg total) by mouth daily. 90 tablet 3  . Vitamin D, Cholecalciferol, 400 units TABS Take by mouth.    . fluticasone (FLONASE) 50 MCG/ACT nasal spray Place 2 sprays into both nostrils daily. (Patient not taking: Reported on 09/23/2017) 16 g 6  . diltiazem (TIAZAC) 120 MG 24 hr capsule TAKE 1 CAPSULE BY MOUTH DAILY (Patient not taking: Reported on 09/23/2017) 90 capsule 1  . sulfamethoxazole-trimethoprim (BACTRIM DS,SEPTRA DS) 800-160 MG tablet Take 1 tablet by mouth 2 (two) times daily. (Patient not taking: Reported on 09/23/2017) 6 tablet 0   No facility-administered medications prior to visit.     Review of Systems   Patient denies headache, fevers, malaise, unintentional weight loss, skin rash, eye pain, sinus congestion and sinus pain, sore throat, dysphagia,  hemoptysis , cough, dyspnea, wheezing, chest pain, palpitations, orthopnea, edema, abdominal pain, nausea, melena, diarrhea, constipation, flank pain, dysuria, hematuria, urinary  Frequency, nocturia, numbness, tingling, seizures,  Focal weakness, Loss of consciousness,  Tremor, insomnia, depression, anxiety, and suicidal ideation.     Objective:  BP 134/70 (BP Location: Left Arm, Patient Position: Sitting, Cuff Size: Normal)   Pulse 91   Temp 98 F (36.7 C) (Oral)   Resp 14   Ht 5\' 4"  (1.626 m)   Wt 146 lb 9.6 oz (66.5 kg)   SpO2 96%   BMI 25.16 kg/m   Physical Exam   General appearance: alert, cooperative and appears stated age Head: Normocephalic, without obvious abnormality, atraumatic Eyes: conjunctivae/corneas clear. PERRL, EOM's intact. Fundi benign. Ears: normal TM's and external ear canals both ears Nose: Nares normal. Septum midline. Mucosa normal. No drainage or sinus tenderness. Throat: lips, mucosa, and  tongue normal; teeth and gums normal Neck: no adenopathy, no carotid bruit, no JVD, supple, symmetrical, trachea midline and thyroid not enlarged, symmetric, no tenderness/mass/nodules Lungs: clear to auscultation bilaterally Breasts: normal appearance, no masses or tenderness Heart: regular rate and rhythm, S1, S2 normal, no murmur, click, rub or gallop Abdomen: soft, non-tender; bowel sounds normal; no masses,  no organomegaly Extremities: extremities normal, atraumatic, no cyanosis or edema Pulses: 2+ and symmetric Skin: Skin color, texture, turgor normal. No rashes or lesions Neurologic: Alert and oriented X 3, normal strength and tone. Normal symmetric reflexes. Normal coordination and gait.      Assessment & Plan:   Problem List Items Addressed This Visit    Hypertension - Primary    Well controlled on current regimen. Renal function stable, no changes today.  Lab Results  Component Value Date   CREATININE 0.77 09/23/2017   Lab Results  Component Value Date   NA 135 09/23/2017   K 4.3 09/23/2017   CL 100 09/23/2017   CO2 29 09/23/2017         Relevant Orders   Comprehensive metabolic panel (Completed)   Hyperlipidemia    Mild,  no indication for treatment given her age and absence of diabetes.  Lab Results  Component Value Date   CHOL 194 09/23/2017   HDL 43.10 09/23/2017   LDLCALC 144 (H) 04/16/2017   LDLDIRECT 126.0 09/23/2017   TRIG 280.0 (H) 09/23/2017   CHOLHDL 4 09/23/2017         Relevant Orders   TSH (Completed)   Lipid panel (Completed)   Encounter for preventive health examination    Annual comprehensive preventive exam was done as well as an evaluation and management of chronic conditions .  During the course of the visit the patient was educated and counseled about appropriate screening and preventive services including :  diabetes screening, lipid analysis with projected  10 year  risk for CAD , nutrition counseling, breast, cervical and  colorectal cancer screening, and recommended immunizations.  Printed recommendations for health maintenance screenings was given      Allergic rhinitis    Unclear if current cough is due to rhinitis or viral URI.  Her symptoms do not suggest that she has a bacterial infection and therefore does not need to take an antibiotic cough suppression advised with tessalon         I have discontinued Shandricka M. Dickenson's sulfamethoxazole-trimethoprim. I am also having her start on Zoster Vaccine Adjuvanted and benzonatate. Additionally, I am having her maintain her fish oil-omega-3 fatty acids, chlorpheniramine, Calcium-Vitamin D (CALCET PO), Vitamin D (Cholecalciferol), fluticasone, diltiazem, lisinopril, and rivaroxaban.  Meds ordered this encounter  Medications  . Zoster Vaccine Adjuvanted [facility]) injection    Sig: Inject 0.5 mLs into the muscle once for 1 dose.    Dispense:  1 each    Refill:  1  . benzonatate (TESSALON) 100 MG capsule    Sig: Take 1 capsule (100 mg total) by mouth 2 (two) times daily as needed for cough.    Dispense:  20 capsule    Refill:  0    Medications Discontinued During This Encounter  Medication Reason  . diltiazem (TIAZAC) 120 MG 24 hr capsule Duplicate  . sulfamethoxazole-trimethoprim (BACTRIM DS,SEPTRA DS) 800-160 MG tablet Completed Course    Follow-up: Return in about 6 months (around 03/25/2018) for hypertension hyperlipidemia.   Sherlene Shams, MD

## 2017-09-23 NOTE — Patient Instructions (Addendum)
The ShingRx vaccine is now available in local pharmacies and is much more protective thant Zostavaxs,  It is therefore ADVISED for all interested adults over 60 to prevent shingles   Your "allergies"  May actually  Be due to  a mild viral infection    I also recommend using  the following OTC meds to help with your other symptoms.   Take generic OTC benadryl 25 mg  At bedtime for the drainage,  And tessalon perles for cough (sent to pharmacy)    You can save the Zytec in the morning.   flush your sinuses once daily with Simply Saline  Or Milta Deiters Med's Sinus rinse   I have ordered your annual mammogram   Health Maintenance for Postmenopausal Women Menopause is a normal process in which your reproductive ability comes to an end. This process happens gradually over a span of months to years, usually between the ages of 67 and 89. Menopause is complete when you have missed 12 consecutive menstrual periods. It is important to talk with your health care provider about some of the most common conditions that affect postmenopausal women, such as heart disease, cancer, and bone loss (osteoporosis). Adopting a healthy lifestyle and getting preventive care can help to promote your health and wellness. Those actions can also lower your chances of developing some of these common conditions. What should I know about menopause? During menopause, you may experience a number of symptoms, such as:  Moderate-to-severe hot flashes.  Night sweats.  Decrease in sex drive.  Mood swings.  Headaches.  Tiredness.  Irritability.  Memory problems.  Insomnia.  Choosing to treat or not to treat menopausal changes is an individual decision that you make with your health care provider. What should I know about hormone replacement therapy and supplements? Hormone therapy products are effective for treating symptoms that are associated with menopause, such as hot flashes and night sweats. Hormone replacement  carries certain risks, especially as you become older. If you are thinking about using estrogen or estrogen with progestin treatments, discuss the benefits and risks with your health care provider. What should I know about heart disease and stroke? Heart disease, heart attack, and stroke become more likely as you age. This may be due, in part, to the hormonal changes that your body experiences during menopause. These can affect how your body processes dietary fats, triglycerides, and cholesterol. Heart attack and stroke are both medical emergencies. There are many things that you can do to help prevent heart disease and stroke:  Have your blood pressure checked at least every 1-2 years. High blood pressure causes heart disease and increases the risk of stroke.  If you are 67-59 years old, ask your health care provider if you should take aspirin to prevent a heart attack or a stroke.  Do not use any tobacco products, including cigarettes, chewing tobacco, or electronic cigarettes. If you need help quitting, ask your health care provider.  It is important to eat a healthy diet and maintain a healthy weight. ? Be sure to include plenty of vegetables, fruits, low-fat dairy products, and lean protein. ? Avoid eating foods that are high in solid fats, added sugars, or salt (sodium).  Get regular exercise. This is one of the most important things that you can do for your health. ? Try to exercise for at least 150 minutes each week. The type of exercise that you do should increase your heart rate and make you sweat. This is known as moderate-intensity  exercise. ? Try to do strengthening exercises at least twice each week. Do these in addition to the moderate-intensity exercise.  Know your numbers.Ask your health care provider to check your cholesterol and your blood glucose. Continue to have your blood tested as directed by your health care provider.  What should I know about cancer screening? There  are several types of cancer. Take the following steps to reduce your risk and to catch any cancer development as early as possible. Breast Cancer  Practice breast self-awareness. ? This means understanding how your breasts normally appear and feel. ? It also means doing regular breast self-exams. Let your health care provider know about any changes, no matter how small.  If you are 57 or older, have a clinician do a breast exam (clinical breast exam or CBE) every year. Depending on your age, family history, and medical history, it may be recommended that you also have a yearly breast X-ray (mammogram).  If you have a family history of breast cancer, talk with your health care provider about genetic screening.  If you are at high risk for breast cancer, talk with your health care provider about having an MRI and a mammogram every year.  Breast cancer (BRCA) gene test is recommended for women who have family members with BRCA-related cancers. Results of the assessment will determine the need for genetic counseling and BRCA1 and for BRCA2 testing. BRCA-related cancers include these types: ? Breast. This occurs in males or females. ? Ovarian. ? Tubal. This may also be called fallopian tube cancer. ? Cancer of the abdominal or pelvic lining (peritoneal cancer). ? Prostate. ? Pancreatic.  Cervical, Uterine, and Ovarian Cancer Your health care provider may recommend that you be screened regularly for cancer of the pelvic organs. These include your ovaries, uterus, and vagina. This screening involves a pelvic exam, which includes checking for microscopic changes to the surface of your cervix (Pap test).  For women ages 21-65, health care providers may recommend a pelvic exam and a Pap test every three years. For women ages 10-65, they may recommend the Pap test and pelvic exam, combined with testing for human papilloma virus (HPV), every five years. Some types of HPV increase your risk of cervical  cancer. Testing for HPV may also be done on women of any age who have unclear Pap test results.  Other health care providers may not recommend any screening for nonpregnant women who are considered low risk for pelvic cancer and have no symptoms. Ask your health care provider if a screening pelvic exam is right for you.  If you have had past treatment for cervical cancer or a condition that could lead to cancer, you need Pap tests and screening for cancer for at least 20 years after your treatment. If Pap tests have been discontinued for you, your risk factors (such as having a new sexual partner) need to be reassessed to determine if you should start having screenings again. Some women have medical problems that increase the chance of getting cervical cancer. In these cases, your health care provider may recommend that you have screening and Pap tests more often.  If you have a family history of uterine cancer or ovarian cancer, talk with your health care provider about genetic screening.  If you have vaginal bleeding after reaching menopause, tell your health care provider.  There are currently no reliable tests available to screen for ovarian cancer.  Lung Cancer Lung cancer screening is recommended for adults 25-71 years old  who are at high risk for lung cancer because of a history of smoking. A yearly low-dose CT scan of the lungs is recommended if you:  Currently smoke.  Have a history of at least 30 pack-years of smoking and you currently smoke or have quit within the past 15 years. A pack-year is smoking an average of one pack of cigarettes per day for one year.  Yearly screening should:  Continue until it has been 15 years since you quit.  Stop if you develop a health problem that would prevent you from having lung cancer treatment.  Colorectal Cancer  This type of cancer can be detected and can often be prevented.  Routine colorectal cancer screening usually begins at age 43  and continues through age 54.  If you have risk factors for colon cancer, your health care provider may recommend that you be screened at an earlier age.  If you have a family history of colorectal cancer, talk with your health care provider about genetic screening.  Your health care provider may also recommend using home test kits to check for hidden blood in your stool.  A small camera at the end of a tube can be used to examine your colon directly (sigmoidoscopy or colonoscopy). This is done to check for the earliest forms of colorectal cancer.  Direct examination of the colon should be repeated every 5-10 years until age 36. However, if early forms of precancerous polyps or small growths are found or if you have a family history or genetic risk for colorectal cancer, you may need to be screened more often.  Skin Cancer  Check your skin from head to toe regularly.  Monitor any moles. Be sure to tell your health care provider: ? About any new moles or changes in moles, especially if there is a change in a mole's shape or color. ? If you have a mole that is larger than the size of a pencil eraser.  If any of your family members has a history of skin cancer, especially at a young age, talk with your health care provider about genetic screening.  Always use sunscreen. Apply sunscreen liberally and repeatedly throughout the day.  Whenever you are outside, protect yourself by wearing long sleeves, pants, a wide-brimmed hat, and sunglasses.  What should I know about osteoporosis? Osteoporosis is a condition in which bone destruction happens more quickly than new bone creation. After menopause, you may be at an increased risk for osteoporosis. To help prevent osteoporosis or the bone fractures that can happen because of osteoporosis, the following is recommended:  If you are 28-41 years old, get at least 1,000 mg of calcium and at least 600 mg of vitamin D per day.  If you are older than  age 61 but younger than age 77, get at least 1,200 mg of calcium and at least 600 mg of vitamin D per day.  If you are older than age 24, get at least 1,200 mg of calcium and at least 800 mg of vitamin D per day.  Smoking and excessive alcohol intake increase the risk of osteoporosis. Eat foods that are rich in calcium and vitamin D, and do weight-bearing exercises several times each week as directed by your health care provider. What should I know about how menopause affects my mental health? Depression may occur at any age, but it is more common as you become older. Common symptoms of depression include:  Low or sad mood.  Changes in sleep patterns.  Changes in appetite or eating patterns.  Feeling an overall lack of motivation or enjoyment of activities that you previously enjoyed.  Frequent crying spells.  Talk with your health care provider if you think that you are experiencing depression. What should I know about immunizations? It is important that you get and maintain your immunizations. These include:  Tetanus, diphtheria, and pertussis (Tdap) booster vaccine.  Influenza every year before the flu season begins.  Pneumonia vaccine.  Shingles vaccine.  Your health care provider may also recommend other immunizations. This information is not intended to replace advice given to you by your health care provider. Make sure you discuss any questions you have with your health care provider. Document Released: 07/13/2005 Document Revised: 12/09/2015 Document Reviewed: 02/22/2015 Elsevier Interactive Patient Education  2018 [employer].

## 2017-09-24 LAB — LIPID PANEL
CHOLESTEROL: 194 mg/dL (ref 0–200)
HDL: 43.1 mg/dL (ref >39.00)
NonHDL: 150.65
Total CHOL/HDL Ratio: 4
Triglycerides: 280 mg/dL — ABNORMAL HIGH (ref 0.0–149.0)
VLDL: 56 mg/dL — AB (ref 0.0–40.0)

## 2017-09-24 LAB — COMPREHENSIVE METABOLIC PANEL
ALBUMIN: 4 g/dL (ref 3.5–5.2)
ALT: 13 U/L (ref 0–35)
AST: 17 U/L (ref 0–37)
Alkaline Phosphatase: 71 U/L (ref 39–117)
BUN: 15 mg/dL (ref 6–23)
CALCIUM: 10.1 mg/dL (ref 8.4–10.5)
CHLORIDE: 100 meq/L (ref 96–112)
CO2: 29 mEq/L (ref 19–32)
Creatinine, Ser: 0.77 mg/dL (ref 0.40–1.20)
GFR: 75.8 mL/min (ref >60.00)
Glucose, Bld: 110 mg/dL — ABNORMAL HIGH (ref 70–99)
POTASSIUM: 4.3 meq/L (ref 3.5–5.1)
SODIUM: 135 meq/L (ref 135–145)
Total Bilirubin: 0.6 mg/dL (ref 0.2–1.2)
Total Protein: 6.9 g/dL (ref 6.0–8.3)

## 2017-09-24 LAB — LDL CHOLESTEROL, DIRECT: Direct LDL: 126 mg/dL

## 2017-09-24 LAB — TSH: TSH: 1.18 u[IU]/mL (ref 0.35–4.50)

## 2017-09-24 NOTE — Assessment & Plan Note (Signed)
Mild, no indication for treatment given her age and absence of diabetes.  Lab Results  Component Value Date   CHOL 194 09/23/2017   HDL 43.10 09/23/2017   LDLCALC 144 (H) 04/16/2017   LDLDIRECT 126.0 09/23/2017   TRIG 280.0 (H) 09/23/2017   CHOLHDL 4 09/23/2017

## 2017-09-24 NOTE — Assessment & Plan Note (Signed)
Well controlled on current regimen. Renal function stable, no changes today.  Lab Results  Component Value Date   CREATININE 0.77 09/23/2017   Lab Results  Component Value Date   NA 135 09/23/2017   K 4.3 09/23/2017   CL 100 09/23/2017   CO2 29 09/23/2017

## 2017-09-24 NOTE — Assessment & Plan Note (Signed)
Annual comprehensive preventive exam was done as well as an evaluation and management of chronic conditions .  During the course of the visit the patient was educated and counseled about appropriate screening and preventive services including :  diabetes screening, lipid analysis with projected  10 year  risk for CAD , nutrition counseling, breast, cervical and colorectal cancer screening, and recommended immunizations.  Printed recommendations for health maintenance screenings was given 

## 2017-09-24 NOTE — Assessment & Plan Note (Signed)
Unclear if current cough is due to rhinitis or viral URI.  Her symptoms do not suggest that she has a bacterial infection and therefore does not need to take an antibiotic cough suppression advised with tessalon

## 2017-09-26 ENCOUNTER — Telehealth: Payer: Self-pay | Admitting: Internal Medicine

## 2017-09-26 NOTE — Telephone Encounter (Signed)
Called the patient to inform her that Dr.Tullo is not in the office and will return on Monday and that according to the note on Monday Dr. Darrick Huntsmanullo mentioned that she did not think that it was viral or that she needed an antibiotic. The patient states that the tesslon perls are working but she is congested. I informed that she could see another provider in the office if an appointment was available but she states [place] and cant get here I also offered [facility] patient verbalized understanding.

## 2017-09-26 NOTE — Telephone Encounter (Unsigned)
Copied from CRM [id number]. Topic: Quick Communication - See Telephone Encounter >> Sep 26, 2017  1:59 PM Floria RavelingStovall, Shana A wrote: CRM for notification. See Telephone encounter for: 09/26/17. Pt called in and stated that she was seen on Monday the 22nd. She said that she is still cough and not feeling any better.  She would like to know if Dr Darrick Huntsmantullo could call in her some antibiotic?  Pharmacy -[facility] in [place]

## 2017-09-26 NOTE — Telephone Encounter (Signed)
Patient checking status, requesting call back asap

## 2017-11-02 ENCOUNTER — Other Ambulatory Visit: Payer: Self-pay | Admitting: Internal Medicine

## 2018-01-23 ENCOUNTER — Ambulatory Visit (INDEPENDENT_AMBULATORY_CARE_PROVIDER_SITE_OTHER): Payer: PPO

## 2018-01-23 VITALS — BP 132/82 | HR 64 | Temp 98.1°F | Resp 14 | Ht 63.5 in | Wt 146.0 lb

## 2018-01-23 DIAGNOSIS — E782 Mixed hyperlipidemia: Secondary | ICD-10-CM

## 2018-01-23 DIAGNOSIS — Z0000 Encounter for general adult medical examination without abnormal findings: Secondary | ICD-10-CM

## 2018-01-23 DIAGNOSIS — I10 Essential (primary) hypertension: Secondary | ICD-10-CM | POA: Diagnosis not present

## 2018-01-23 NOTE — Progress Notes (Addendum)
Subjective:   Laurie [name] is a 84 y.o. female who presents for Medicare Annual (Subsequent) preventive examination.  Review of Systems:  No ROS.  Medicare Wellness Visit. Additional risk factors are reflected in the social history.  Cardiac Risk Factors include: advanced age (>[id number], 81>65 women);hypertension     Objective:     Vitals: BP 132/82 (BP Location: Left Arm, Patient Position: Sitting, Cuff Size: Normal)   Pulse 64   Temp 98.1 F (36.7 C) (Oral)   Resp 14   Ht 5' 3.5" (1.613 m)   Wt 146 lb (66.2 kg)   SpO2 98%   BMI 25.46 kg/m   Body mass index is 25.46 kg/m.  Advanced Directives 01/23/2018 08/29/2016  Does Patient Have a Medical Advance Directive? Yes Yes  Type of Advance Directive Living will Healthcare Power of [place];Living will  Does patient want to make changes to medical advance directive? No - Patient declined No - Patient declined  Copy of Healthcare Power of Attorney in Chart? - No - copy requested    Tobacco Social History   Tobacco Use  Smoking Status Never Smoker  Smokeless Tobacco Never Used     Counseling given: Not Answered   Clinical Intake:  Pre-visit preparation completed: Yes  Pain : No/denies pain     Nutritional Status: BMI 25 -29 Overweight Diabetes: No  How often do you need to have someone help you when you read instructions, pamphlets, or other written materials from your doctor or pharmacy?: 1 - Never  Interpreter Needed?: No     Past Medical History:  Diagnosis Date  . Arrhythmia   . Hypertension    Past Surgical History:  Procedure Laterality Date  . TONSILLECTOMY     Family History  Problem Relation Age of Onset  . Kidney disease Mother   . Anemia Mother   . Heart disease Father [id number]   Social History   Socioeconomic History  . Marital status: Widowed    Spouse name: Not on file  . Number of children: Not on file  . Years of education: Not on file  . Highest education level: Not on file    Occupational History  . Not on file  Social Needs  . Financial resource strain: Not hard at all  . Food insecurity:    Worry: Never true    Inability: Never true  . Transportation needs:    Medical: Not on file    Non-medical: Not on file  Tobacco Use  . Smoking status: Never Smoker  . Smokeless tobacco: Never Used  Substance and Sexual Activity  . Alcohol use: No  . Drug use: No  . Sexual activity: Never  Lifestyle  . Physical activity:    Days per week: 0 days    Minutes per session: Not on file  . Stress: Not on file  Relationships  . Social connections:    Talks on phone: Not on file    Gets together: Not on file    Attends religious service: Not on file    Active member of club or organization: Not on file    Attends meetings of clubs or organizations: Not on file    Relationship status: Not on file  Other Topics Concern  . Not on file  Social History Narrative  . Not on file    Outpatient Encounter Medications as of 01/23/2018  Medication Sig  . Calcium-Vitamin D (CALCET PO) Take 2 tablets by mouth daily.   . chlorpheniramine (CHLOR-TRIMETON)  4 MG tablet Take 4 mg by mouth 2 (two) times daily as needed.  . diltiazem (CARDIZEM CD) 120 MG 24 hr capsule Take 1 capsule (120 mg total) by mouth daily.  . fish oil-omega-3 fatty acids 1000 MG capsule Take 1 g by mouth daily.   Marland Kitchen lisinopril (PRINIVIL,ZESTRIL) 40 MG tablet TAKE 1 TABLET BY MOUTH TWICE A DAY  . rivaroxaban (XARELTO) 20 MG TABS tablet Take 1 tablet (20 mg total) by mouth daily.  . Vitamin D, Cholecalciferol, 400 units TABS Take by mouth.  . fluticasone (FLONASE) 50 MCG/ACT nasal spray Place 2 sprays into both nostrils daily. (Patient not taking: Reported on 01/23/2018)  . [DISCONTINUED] benzonatate (TESSALON) 100 MG capsule Take 1 capsule (100 mg total) by mouth 2 (two) times daily as needed for cough.   No facility-administered encounter medications on file as of 01/23/2018.     Activities of Daily  Living In your present state of health, do you have any difficulty performing the following activities: 01/23/2018  Hearing? N  Vision? N  Difficulty concentrating or making decisions? N  Walking or climbing stairs? N  Dressing or bathing? N  Doing errands, shopping? N  Preparing Food and eating ? N  Using the Toilet? N  In the past six months, have you accidently leaked urine? N  Do you have problems with loss of bowel control? N  Managing your Medications? N  Managing your Finances? N  Housekeeping or managing your Housekeeping? N  Some recent data might be hidden    Patient Care Team: Sherlene Shams, MD as PCP - General (Internal Medicine)    Assessment:   This is a routine wellness examination for Susan.  The goal of the wellness visit is to assist the patient how to close the gaps in care and create a preventative care plan for the patient.   No acute issues today.  Reports doing well.    The roster of all physicians providing medical care to patient is listed in the Snapshot section of the chart.  Taking calcium VIT D as appropriate/Osteoporosis risk reviewed.    Safety issues reviewed; Lives alone. Alarm, smoke and carbon monoxide detectors in the home. No firearms in the home. Wears seatbelts when driving or riding with others. No violence in the home.  They do not have excessive sun exposure.  Discussed the need for sun protection: hats, long sleeves and the use of sunscreen if there is significant sun exposure.  Patient is alert, normal appearance, oriented to person/place/and time.  Correctly identified the president of the [place] and recalls of 3/3 words. Performs simple calculations and can read correct time from watch face.  Displays appropriate judgement.  No new identified risk were noted.  No failures at ADL's or IADL's.    BMI- discussed the importance of a healthy diet, water intake and the benefits of aerobic exercise. Educational material provided.    24 hour diet recall: Regular diet  Dental- every 6 months.  Sleep patterns- Sleeps without issues.   Mammogram discussed.  Removed from health maintenance per patient request; aged out.   Influenza discussed.  Removed from health mainteance for future administration, per patient request.   6 month lab recheck appointment scheduled and future labs placed, per pcp note last visit 09/2017.   Health maintenance gaps- closed.  Patient Concerns: None at this time. Follow up with PCP as needed.  Exercise Activities and Dietary recommendations Current Exercise Habits: Home exercise routine, Type of exercise: walking,  Time (Minutes): 20, Frequency (Times/Week): 4, Weekly Exercise (Minutes/Week): 80, Intensity: Mild  Goals    . Low Cholesterol Diet     Eat less simple sugars Eat less fat Educational material provided       Fall Risk Fall Risk  01/23/2018 09/23/2017 08/29/2016 01/06/2015 07/07/2014  Falls in the past year? No No No No No   Depression Screen PHQ 2/9 Scores 01/23/2018 09/23/2017 08/29/2016 01/06/2015  PHQ - 2 Score 0 0 0 0  PHQ- 9 Score - 0 - -     Cognitive Function MMSE - Mini Mental State Exam 01/23/2018 08/29/2016  Orientation to time 5 5  Orientation to Place 5 5  Registration 3 3  Attention/ Calculation 5 5  Recall 3 3  Language- name 2 objects 2 2  Language- repeat 1 1  Language- follow 3 step command 3 3  Language- read & follow direction 1 1  Write a sentence 1 1  Copy design 1 1  Total score 30 30        Immunization History  Administered Date(s) Administered  . Pneumococcal Conjugate-13 07/07/2014  . Pneumococcal Polysaccharide-23 03/29/2010, 03/26/2016  . Tdap 03/29/2010  . Zoster 03/10/2010   Screening Tests Health Maintenance  Topic Date Due  . TETANUS/TDAP  03/29/2020  . DEXA SCAN  Completed  . PNA vac Low Risk Adult  Completed  . INFLUENZA VACCINE  Discontinued  . MAMMOGRAM  Discontinued      Plan:   End of life planning; Advance  aging; Advanced directives discussed. Copy of current HCPOA/Living Will requested.    I have personally reviewed and noted the following in the patient's chart:   . Medical and social history . Use of alcohol, tobacco or illicit drugs  . Current medications and supplements . Functional ability and status . Nutritional status . Physical activity . Advanced directives . List of other physicians . Hospitalizations, surgeries, and ER visits in previous 12 months . Vitals . Screenings to include cognitive, depression, and falls . Referrals and appointments  In addition, I have reviewed and discussed with patient certain preventive protocols, quality metrics, and best practice recommendations. A written personalized care plan for preventive services as well as general preventive health recommendations were provided to patient.     OBrien-Blaney, Willliam Pettet L, LPN  1/[id number]   I have reviewed the above information and agree with above.   Duncan Dull, MD

## 2018-01-23 NOTE — Patient Instructions (Addendum)
  Laurie [name] , Thank you for taking time to come for your Medicare Wellness Visit. I appreciate your ongoing commitment to your health goals. Please review the following plan we discussed and let me know if I can assist you in the future.   Follow up as needed.    Bring a copy of your Health Care Power of Attorney and/or Living Will to be scanned into chart.  Return in October for 6 month lab repeat requested by your doctor.   Have a great day!  These are the goals we discussed: Goals    . Low Cholesterol Diet     Eat less simple sugars Eat less fat Educational material provided       This is a list of the screening recommended for you and due dates:  Health Maintenance  Topic Date Due  . Tetanus Vaccine  03/29/2020  . DEXA scan (bone density measurement)  Completed  . Pneumonia vaccines  Completed  . Flu Shot  Discontinued  . Mammogram  Discontinued

## 2018-01-29 ENCOUNTER — Other Ambulatory Visit (INDEPENDENT_AMBULATORY_CARE_PROVIDER_SITE_OTHER): Payer: PPO

## 2018-01-29 DIAGNOSIS — I10 Essential (primary) hypertension: Secondary | ICD-10-CM

## 2018-01-29 DIAGNOSIS — E782 Mixed hyperlipidemia: Secondary | ICD-10-CM

## 2018-01-29 LAB — COMPREHENSIVE METABOLIC PANEL
ALBUMIN: 4.1 g/dL (ref 3.5–5.2)
ALT: 11 U/L (ref 0–35)
AST: 16 U/L (ref 0–37)
Alkaline Phosphatase: 63 U/L (ref 39–117)
BUN: 13 mg/dL (ref 6–23)
CALCIUM: 10.1 mg/dL (ref 8.4–10.5)
CHLORIDE: 104 meq/L (ref 96–112)
CO2: 27 mEq/L (ref 19–32)
Creatinine, Ser: 0.85 mg/dL (ref 0.40–1.20)
GFR: 67.57 mL/min (ref >60.00)
Glucose, Bld: 109 mg/dL — ABNORMAL HIGH (ref 70–99)
POTASSIUM: 4 meq/L (ref 3.5–5.1)
Sodium: 140 mEq/L (ref 135–145)
Total Bilirubin: 0.7 mg/dL (ref 0.2–1.2)
Total Protein: 7.1 g/dL (ref 6.0–8.3)

## 2018-01-29 LAB — LIPID PANEL
Cholesterol: 213 mg/dL — ABNORMAL HIGH (ref 0–200)
HDL: 48 mg/dL (ref >39.00)
LDL Cholesterol: 129 mg/dL — ABNORMAL HIGH (ref 0–99)
NONHDL: 164.66
Total CHOL/HDL Ratio: 4
Triglycerides: 179 mg/dL — ABNORMAL HIGH (ref 0.0–149.0)
VLDL: 35.8 mg/dL (ref 0.0–40.0)

## 2018-01-29 LAB — LDL CHOLESTEROL, DIRECT: LDL DIRECT: 148 mg/dL

## 2018-01-29 LAB — TSH: TSH: 2.77 u[IU]/mL (ref 0.35–4.50)

## 2018-01-30 ENCOUNTER — Other Ambulatory Visit: Payer: Self-pay | Admitting: Internal Medicine

## 2018-01-30 DIAGNOSIS — I10 Essential (primary) hypertension: Secondary | ICD-10-CM

## 2018-01-30 DIAGNOSIS — E782 Mixed hyperlipidemia: Secondary | ICD-10-CM

## 2018-01-30 DIAGNOSIS — R7301 Impaired fasting glucose: Secondary | ICD-10-CM

## 2018-01-30 NOTE — Progress Notes (Signed)
a1c

## 2018-01-31 ENCOUNTER — Other Ambulatory Visit: Payer: Self-pay | Admitting: Internal Medicine

## 2018-04-07 DIAGNOSIS — Z961 Presence of intraocular lens: Secondary | ICD-10-CM | POA: Diagnosis not present

## 2018-04-29 ENCOUNTER — Other Ambulatory Visit: Payer: Self-pay | Admitting: Internal Medicine

## 2018-06-06 ENCOUNTER — Telehealth: Payer: Self-pay | Admitting: Cardiovascular Disease

## 2018-06-06 ENCOUNTER — Other Ambulatory Visit: Payer: Self-pay | Admitting: Cardiovascular Disease

## 2018-06-06 NOTE — Telephone Encounter (Signed)
Please review for refill. Thanks!  

## 2018-06-06 NOTE — Telephone Encounter (Signed)
Pt c/o medication issue:  1. Name of Medication: Xarelto   2. How are you currently taking this medication (dosage and times per day)? 20 mg po q d   3. Are you having a reaction (difficulty breathing--STAT)?   4. What is your medication issue? Cannot afford what pharmacy is charging and this cost is unuasual .  Please call to discuss options

## 2018-06-11 NOTE — Telephone Encounter (Signed)
S/w patient and she already has this worked out with her pharmacy and she has her Xarelto. Nothing else is needed from [place] at this time.

## 2018-07-26 ENCOUNTER — Other Ambulatory Visit: Payer: Self-pay | Admitting: Internal Medicine

## 2018-07-27 ENCOUNTER — Other Ambulatory Visit: Payer: Self-pay | Admitting: Internal Medicine

## 2018-10-30 ENCOUNTER — Telehealth: Payer: Self-pay

## 2018-10-30 NOTE — Telephone Encounter (Signed)
Virtual Visit Pre-Appointment Phone Call  "Laurie [name], I am calling you today to discuss your upcoming appointment. We are currently trying to limit exposure to the virus that causes COVID-19 by seeing patients at home rather than in the office."  1. "What is the BEST phone number to call the day of the visit?" - include this in appointment notes  2. "Do you have or have access to (through a family member/friend) a smartphone with video capability that we can use for your visit?" a. If yes - list this number in appt notes as "cell" (if different from BEST phone #) and list the appointment type as a VIDEO visit in appointment notes b. If no - list the appointment type as a PHONE visit in appointment notes  3. Confirm consent - "In the setting of the current Covid19 crisis, you are scheduled for a video visit with your provider on December 04, 2018 at 10:00AM.  Just as we do with many in-office visits, in order for you to participate in this visit, we must obtain consent.  If you'd like, I can send this to your mychart (if signed up) or email for you to review.  Otherwise, I can obtain your verbal consent now.  All virtual visits are billed to your insurance company just like a normal visit would be.  By agreeing to a virtual visit, we'd like you to understand that the technology does not allow for your provider to perform an examination, and thus may limit your provider's ability to fully assess your condition. If your provider identifies any concerns that need to be evaluated in person, we will make arrangements to do so.  Finally, though the technology is pretty good, we cannot assure that it will always work on either your or our end, and in the setting of a video visit, we may have to convert it to a phone-only visit.  In either situation, we cannot ensure that we have a secure connection.  Are you willing to proceed?" STAFF: Did the patient verbally acknowledge consent to telehealth visit? Document YES/NO  here: YES  4. Advise patient to be prepared - "Two hours prior to your appointment, go ahead and check your blood pressure, pulse, oxygen saturation, and your weight (if you have the equipment to check those) and write them all down. When your visit starts, your provider will ask you for this information. If you have an Apple Watch or Kardia device, please plan to have heart rate information ready on the day of your appointment. Please have a pen and paper handy nearby the day of the visit as well."  5. Give patient instructions for MyChart download to smartphone OR Doximity/[email or web address] as below if video visit (depending on what platform provider is using)  6. Inform patient they will receive a phone call 15 minutes prior to their appointment time (may be from unknown caller ID) so they should be prepared to answer    TELEPHONE CALL NOTE  Laurie [name] has been deemed a candidate for a follow-up tele-health visit to limit community exposure during the Covid-19 pandemic. I spoke with the patient via phone to ensure availability of phone/video source, confirm preferred email & phone number, and discuss instructions and expectations.  I reminded Laurie [name] to be prepared with any vital sign and/or heart rhythm information that could potentially be obtained via home monitoring, at the time of her visit. I reminded Laurie [name] to expect a phone call prior to  her visit.  Tommie Sams McClain 10/30/2018 11:30 AM   INSTRUCTIONS FOR DOWNLOADING THE MYCHART APP TO SMARTPHONE  - The patient must first make sure to have activated MyChart and know their login information - If Apple, go to [employer] and type in MyChart in the search bar and download the app. If Android, ask patient to go to [employer] and type in [place] in the search bar and download the app. The app is free but as with any other app downloads, their phone may require them to verify saved payment information or  Apple/Android password.  - The patient will need to then log into the app with their MyChart username and password, and select [facility] as their healthcare provider to link the account. When it is time for your visit, go to the MyChart app, find appointments, and click Begin Video Visit. Be sure to Select Allow for your device to access the Microphone and Camera for your visit. You will then be connected, and your provider will be with you shortly.  **If they have any issues connecting, or need assistance please contact MyChart service desk [phone])**  **If using a computer, in order to ensure the best quality for their visit they will need to use either of the following Internet Browsers: [employer] or Google Chrome**  IF USING DOXIMITY or DOXY.ME - The patient will receive a link just prior to their visit by text.     FULL LENGTH CONSENT FOR TELE-HEALTH VISIT   I hereby voluntarily request, consent and authorize [facility] and its employed or contracted physicians, physician assistants, nurse practitioners or other licensed health care professionals (the Practitioner), to provide me with telemedicine health care services (the "Services") as deemed necessary by the treating Practitioner. I acknowledge and consent to receive the Services by the Practitioner via telemedicine. I understand that the telemedicine visit will involve communicating with the Practitioner through live audiovisual communication technology and the disclosure of certain medical information by electronic transmission. I acknowledge that I have been given the opportunity to request an in-person assessment or other available alternative prior to the telemedicine visit and am voluntarily participating in the telemedicine visit.  I understand that I have the right to withhold or withdraw my consent to the use of telemedicine in the course of my care at any time, without affecting my right to future care  or treatment, and that the Practitioner or I may terminate the telemedicine visit at any time. I understand that I have the right to inspect all information obtained and/or recorded in the course of the telemedicine visit and may receive copies of available information for a reasonable fee.  I understand that some of the potential risks of receiving the Services via telemedicine include:  Marland Kitchen Delay or interruption in medical evaluation due to technological equipment failure or disruption; . Information transmitted may not be sufficient (e.g. poor resolution of images) to allow for appropriate medical decision making by the Practitioner; and/or  . In rare instances, security protocols could fail, causing a breach of personal health information.  Furthermore, I acknowledge that it is my responsibility to provide information about my medical history, conditions and care that is complete and accurate to the best of my ability. I acknowledge that Practitioner's advice, recommendations, and/or decision may be based on factors not within their control, such as incomplete or inaccurate data provided by me or distortions of diagnostic images or specimens that may result from electronic transmissions.  I understand that the practice of medicine is not an exact science and that Practitioner makes no warranties or guarantees regarding treatment outcomes. I acknowledge that I will receive a copy of this consent concurrently upon execution via email to the email address I last provided but may also request a printed copy by calling the office of [facility].    I understand that my insurance will be billed for this visit.   I have read or had this consent read to me. . I understand the contents of this consent, which adequately explains the benefits and risks of the Services being provided via telemedicine.  . I have been provided ample opportunity to ask questions regarding this consent and the Services and have had  my questions answered to my satisfaction. . I give my informed consent for the services to be provided through the use of telemedicine in my medical care  By participating in this telemedicine visit I agree to the above.

## 2018-11-03 ENCOUNTER — Other Ambulatory Visit: Payer: Self-pay | Admitting: Internal Medicine

## 2018-11-03 DIAGNOSIS — R7301 Impaired fasting glucose: Secondary | ICD-10-CM

## 2018-11-03 DIAGNOSIS — I10 Essential (primary) hypertension: Secondary | ICD-10-CM

## 2018-11-03 DIAGNOSIS — Z7901 Long term (current) use of anticoagulants: Secondary | ICD-10-CM

## 2018-11-03 NOTE — Telephone Encounter (Signed)
Refill request for lisinopril.

## 2018-11-04 MED ORDER — TELMISARTAN 80 MG PO TABS: 80.0000 mg | ORAL_TABLET | Freq: Every day | ORAL | 0 refills | Status: DC

## 2018-11-04 NOTE — Telephone Encounter (Signed)
Last office vist and last labs over 6 months ago.  Needs labs  First,  Medication change to telmisartan  And OV  Please contact for appts and convey rationale for change as stated below :    I am making a decision to change your lisinopril to telmisartan, based on increased reports by one of my ENT colleagues of patients  developing tongue and throat swelling from lisinopril.  The condition , called "angioedema," can be fatal if a person's airway is compromised.  I also want you to take it at night instead of morning,  s recent studies have shown that taking your blood pressure medications at night protects you better from heart attacks and strokes.

## 2018-11-06 ENCOUNTER — Other Ambulatory Visit: Payer: Self-pay | Admitting: Internal Medicine

## 2018-11-06 NOTE — Telephone Encounter (Addendum)
Patient called office very Upset she was not contacted by Office before he new medication was called in Telmisartan, advised patient of PCP note and that she needed an appointment and tried to explain under circumstances of COVID and the workings of the office have changed, that call was intended o go out to her and she was to be contacted but that the pharmacy u

## 2018-11-07 ENCOUNTER — Ambulatory Visit (INDEPENDENT_AMBULATORY_CARE_PROVIDER_SITE_OTHER): Payer: PPO | Admitting: Internal Medicine

## 2018-11-07 ENCOUNTER — Other Ambulatory Visit: Payer: Self-pay

## 2018-11-07 ENCOUNTER — Encounter: Payer: Self-pay | Admitting: Internal Medicine

## 2018-11-07 DIAGNOSIS — I10 Essential (primary) hypertension: Secondary | ICD-10-CM | POA: Diagnosis not present

## 2018-11-07 DIAGNOSIS — E782 Mixed hyperlipidemia: Secondary | ICD-10-CM | POA: Diagnosis not present

## 2018-11-07 DIAGNOSIS — I4811 Longstanding persistent atrial fibrillation: Secondary | ICD-10-CM

## 2018-11-07 DIAGNOSIS — R7301 Impaired fasting glucose: Secondary | ICD-10-CM

## 2018-11-07 DIAGNOSIS — Z7901 Long term (current) use of anticoagulants: Secondary | ICD-10-CM | POA: Diagnosis not present

## 2018-11-07 MED ORDER — LISINOPRIL 40 MG PO TABS: 40.0000 mg | ORAL_TABLET | Freq: Two times a day (BID) | ORAL | 0 refills | Status: DC

## 2018-11-07 NOTE — Telephone Encounter (Signed)
Refilled request for lisinopril.

## 2018-11-07 NOTE — Progress Notes (Signed)
Subjective:  Patient ID: Laurie [name], female    DOB: [birth year]  Age: 85 y.o. MRN: [id number]  CC: Diagnoses of Mixed hyperlipidemia, Impaired fasting glucose, Long term current use of anticoagulant therapy, Longstanding persistent atrial fibrillation, and Essential hypertension were pertinent to this visit.  HPI Laurie [name] presents for DISCUSSION of blood pressure medication change,   Patient's lisinopril was changed to telmisartan and she was supposed to be advised by phone bu staff.  But the call from the office was delayed and she was notified by the pharmacy that her lisinopril was changed to telmisartam.  She has not started the new medication or picked it up.   She has a long history of medication intolerances, many of which she cannot remember the specific reaction,  But recalls having angioedema from losartan .  Other intolerances to hctz , metoprolol and triamterene are listed.   She feels fine, and has no interest in changing medications at this point.  Home readings have been normotensive on current regimen of lisinopril and diltiazem CD , which she takes for management of chronic atrial fibrillation .  She is also anticoagulated with Xarelto .  She is exercising regularly and following a careful diet.   The patient has no signs or symptoms of COVID 19 infection (fever, cough, sore throat  or shortness of breath beyond what is typical for patient).  Patient denies contact with other persons with the above mentioned symptoms or with anyone confirmed to have COVID 19   Outpatient Medications Prior to Visit  Medication Sig Dispense Refill  . Calcium-Vitamin D (CALCET PO) Take 2 tablets by mouth daily.     . chlorpheniramine (CHLOR-TRIMETON) 4 MG tablet Take 4 mg by mouth 2 (two) times daily as needed.    . diltiazem (CARDIZEM CD) 120 MG 24 hr capsule Take 1 capsule (120 mg total) by mouth daily. 30 capsule 2  . diltiazem (TIAZAC) 120 MG 24 hr capsule TAKE 1 CAPSULE BY MOUTH  EVERY DAY 90 capsule 1  . fish oil-omega-3 fatty acids 1000 MG capsule Take 1 g by mouth daily.     . fluticasone (FLONASE) 50 MCG/ACT nasal spray Place 2 sprays into both nostrils daily. (Patient not taking: Reported on 01/23/2018) 16 g 6  . Vitamin D, Cholecalciferol, 400 units TABS Take by mouth.    Laurie [name]. XARELTO 20 MG TABS tablet TAKE 1 TABLET BY MOUTH EVERY DAY 90 tablet 1  . lisinopril (PRINIVIL,ZESTRIL) 40 MG tablet TAKE 1 TABLET BY MOUTH TWICE A DAY 180 tablet 0  . telmisartan (MICARDIS) 80 MG tablet Take 1 tablet (80 mg total) by mouth at bedtime. 30 tablet 0   No facility-administered medications prior to visit.     Review of Systems;  Patient denies headache, fevers, malaise, unintentional weight loss, skin rash, eye pain, sinus congestion and sinus pain, sore throat, dysphagia,  hemoptysis , cough, dyspnea, wheezing, chest pain, palpitations, orthopnea, edema, abdominal pain, nausea, melena, diarrhea, constipation, flank pain, dysuria, hematuria, urinary  Frequency, nocturia, numbness, tingling, seizures,  Focal weakness, Loss of consciousness,  Tremor, insomnia, depression, anxiety, and suicidal ideation.      Objective:  BP 122/84 (BP Location: Left Arm, Patient Position: Sitting, Cuff Size: Normal)   Pulse 84   Temp 97.7 F (36.5 C) (Oral)   Resp 15   Ht 5' 3.5" (1.613 m)   Wt 145 lb 6.4 oz (66 kg)   SpO2 97%   BMI 25.35 kg/m   BP Readings  from Last 3 Encounters:  11/07/18 122/84  01/23/18 132/82  09/23/17 134/70    Wt Readings from Last 3 Encounters:  11/07/18 145 lb 6.4 oz (66 kg)  01/23/18 146 lb (66.2 kg)  09/23/17 146 lb 9.6 oz (66.5 kg)    General appearance: alert, cooperative and appears stated age Ears: normal TM's and external ear canals both ears Throat: lips, mucosa, and tongue normal; teeth and gums normal Neck: no adenopathy, no carotid bruit, supple, symmetrical, trachea midline and thyroid not enlarged, symmetric, no tenderness/mass/nodules Back:  symmetric, no curvature. ROM normal. No CVA tenderness. Lungs: clear to auscultation bilaterally Heart: regular rate and rhythm, S1, S2 normal, no murmur, click, rub or gallop Abdomen: soft, non-tender; bowel sounds normal; no masses,  no organomegaly Pulses: 2+ and symmetric Skin: Skin color, texture, turgor normal. No rashes or lesions Lymph nodes: Cervical, supraclavicular, and axillary nodes normal.  Lab Results  Component Value Date   HGBA1C 5.8 (H) 11/07/2018    Lab Results  Component Value Date   CREATININE 0.80 11/07/2018   CREATININE 0.85 01/29/2018   CREATININE 0.77 09/23/2017    Lab Results  Component Value Date   WBC 5.9 11/07/2018   HGB 14.1 11/07/2018   HCT 41.9 11/07/2018   PLT 197 11/07/2018   GLUCOSE 85 11/07/2018   CHOL 232 (H) 11/07/2018   TRIG 121 11/07/2018   HDL 54 11/07/2018   LDLDIRECT 148.0 01/29/2018   LDLCALC 154 (H) 11/07/2018   ALT 12 11/07/2018   AST 16 11/07/2018   NA 140 11/07/2018   K 4.0 11/07/2018   CL 106 11/07/2018   CREATININE 0.80 11/07/2018   BUN 15 11/07/2018   CO2 21 11/07/2018   TSH 2.77 01/29/2018   INR 1.0 11/16/2011   HGBA1C 5.8 (H) 11/07/2018   MICROALBUR 0.8 04/16/2017    US Venous Img Lower Unilateral Left  Result Date: 09/20/2016 CLINICAL DATA:  Left leg swelling for 1.5 weeks.  No known injury. EXAM: LEFT LOWER EXTREMITY VENOUS DOPPLER ULTRASOUND TECHNIQUE: Gray-scale sonography with graded compression, as well as color Doppler and duplex ultrasound were performed to evaluate the lower extremity deep venous systems from the level of the common femoral vein and including the common femoral, femoral, profunda femoral, popliteal and calf veins including the posterior tibial, peroneal and gastrocnemius veins when visible. The superficial great saphenous vein was also interrogated. Spectral Doppler was utilized to evaluate flow at rest and with distal augmentation maneuvers in the common femoral, femoral and popliteal  veins. COMPARISON:  None. FINDINGS: Contralateral Common Femoral Vein: Respiratory phasicity is normal and symmetric with the symptomatic side. No evidence of thrombus. Normal compressibility. Common Femoral Vein: No evidence of thrombus. Normal compressibility, respiratory phasicity and response to augmentation. Saphenofemoral Junction: No evidence of thrombus. Normal compressibility and flow on color Doppler imaging. Profunda Femoral Vein: No evidence of thrombus. Normal compressibility and flow on color Doppler imaging. Femoral Vein: No evidence of thrombus. Normal compressibility, respiratory phasicity and response to augmentation. Popliteal Vein: No evidence of thrombus. Normal compressibility, respiratory phasicity and response to augmentation. Calf Veins: No evidence of thrombus. Normal compressibility and flow on color Doppler imaging. Superficial Great Saphenous Vein: No evidence of thrombus. Normal compressibility and flow on color Doppler imaging. Venous Reflux:  None. Other Findings: Baker's cyst with some septations measures 5.5 x 1.6 x 3.6 cm. IMPRESSION: Negative for deep venous thrombosis. Baker's cyst. Electronically Signed   By: Drusilla Kanner M.D.   On: 09/20/2016 15:35    Assessment &  Plan:   Problem List Items Addressed This Visit    Long term current use of anticoagulant therapy   Hyperlipidemia   Relevant Medications   lisinopril (ZESTRIL) 40 MG tablet   Hypertension    Well controlled on current regimen. Renal function stable, no changes today. Given her past reaction to losartan,  And multiple prior intolerances,  No medication  changes were made today.   Lab Results  Component Value Date   CREATININE 0.80 11/07/2018   Lab Results  Component Value Date   NA 140 11/07/2018   K 4.0 11/07/2018   CL 106 11/07/2018   CO2 21 11/07/2018         Relevant Medications   lisinopril (ZESTRIL) 40 MG tablet   Atrial fibrillation (HCC)    Has not tolerated Cardizem CD 180 mg  due to recurrent feelings of palpitations and nervousness. .  Continue Cardizem 120CD 120 mg daily    Continue xarelto for embolic stroke risk mitigation       Relevant Medications   lisinopril (ZESTRIL) 40 MG tablet    Other Visit Diagnoses    Impaired fasting glucose          I have discontinued Alivea M. Carbin's telmisartan. I have also changed her lisinopril. Additionally, I am having her maintain her fish oil-omega-3 fatty acids, chlorpheniramine, Calcium-Vitamin D (CALCET PO), Vitamin D (Cholecalciferol), fluticasone, diltiazem, Xarelto, and diltiazem.  Meds ordered this encounter  Medications  . lisinopril (ZESTRIL) 40 MG tablet    Sig: Take 1 tablet (40 mg total) by mouth 2 (two) times daily.    Dispense:  180 tablet    Refill:  0    PATIENT RESUMING LISINOPRIL AND TELMISARTAN DISCONTINED    Medications Discontinued During This Encounter  Medication Reason  . telmisartan (MICARDIS) 80 MG tablet   . lisinopril (PRINIVIL,ZESTRIL) 40 MG tablet Reorder    Follow-up: No follow-ups on file.   Sherlene Shams, MD

## 2018-11-07 NOTE — Patient Instructions (Signed)
CONTINUE THE LISINOPRIL INSTEAD OF CHANGING TO TELMISARTAN   CONTINUE DILTIAZEM  ONCE DAILY  I'LL SEE YOU IN 6 MONTHS

## 2018-11-07 NOTE — Addendum Note (Signed)
Addended by: Warden Fillers on: 11/07/2018 03:02 PM   Modules accepted: Orders

## 2018-11-08 LAB — CBC WITH DIFFERENTIAL/PLATELET
Absolute Monocytes: 378 cells/uL (ref 200–950)
Basophils Absolute: 59 cells/uL (ref 0–200)
Basophils Relative: 1 %
Eosinophils Absolute: 183 cells/uL (ref 15–500)
Eosinophils Relative: 3.1 %
HCT: 41.9 % (ref 35.0–45.0)
Hemoglobin: 14.1 g/dL (ref 11.7–15.5)
Lymphs Abs: 1640 cells/uL (ref 850–3900)
MCH: 31.5 pg (ref 27.0–33.0)
MCHC: 33.7 g/dL (ref 32.0–36.0)
MCV: 93.5 fL (ref 80.0–100.0)
MPV: 11.4 fL (ref 7.5–12.5)
Monocytes Relative: 6.4 %
Neutro Abs: 3640 cells/uL (ref 1500–7800)
Neutrophils Relative %: 61.7 %
Platelets: 197 10*3/uL (ref 140–400)
RBC: 4.48 10*6/uL (ref 3.80–5.10)
RDW: 13.2 % (ref 11.0–15.0)
Total Lymphocyte: 27.8 %
WBC: 5.9 10*3/uL (ref 3.8–10.8)

## 2018-11-08 LAB — COMPREHENSIVE METABOLIC PANEL
AG Ratio: 1.9 (calc) (ref 1.0–2.5)
ALT: 12 U/L (ref 6–29)
AST: 16 U/L (ref 10–35)
Albumin: 4.3 g/dL (ref 3.6–5.1)
Alkaline phosphatase (APISO): 63 U/L (ref 37–153)
BUN: 15 mg/dL (ref 7–25)
CO2: 21 mmol/L (ref 20–32)
Calcium: 10.3 mg/dL (ref 8.6–10.4)
Chloride: 106 mmol/L (ref 98–110)
Creat: 0.8 mg/dL (ref 0.60–0.88)
Globulin: 2.3 g/dL (calc) (ref 1.9–3.7)
Glucose, Bld: 85 mg/dL (ref 65–99)
Potassium: 4 mmol/L (ref 3.5–5.3)
Sodium: 140 mmol/L (ref 135–146)
Total Bilirubin: 0.8 mg/dL (ref 0.2–1.2)
Total Protein: 6.6 g/dL (ref 6.1–8.1)

## 2018-11-08 LAB — HEMOGLOBIN A1C
Hgb A1c MFr Bld: 5.8 % of total Hgb — ABNORMAL HIGH (ref <5.7)
Mean Plasma Glucose: 120 (calc)
eAG (mmol/L): 6.6 (calc)

## 2018-11-08 LAB — LIPID PANEL
Cholesterol: 232 mg/dL — ABNORMAL HIGH (ref <200)
HDL: 54 mg/dL (ref >=50)
LDL Cholesterol (Calc): 154 mg/dL (calc) — ABNORMAL HIGH
Non-HDL Cholesterol (Calc): 178 mg/dL (calc) — ABNORMAL HIGH (ref <130)
Total CHOL/HDL Ratio: 4.3 (calc) (ref <5.0)
Triglycerides: 121 mg/dL (ref <150)

## 2018-11-09 ENCOUNTER — Encounter: Payer: Self-pay | Admitting: Internal Medicine

## 2018-11-09 NOTE — Assessment & Plan Note (Addendum)
Has not tolerated Cardizem CD 180 mg due to recurrent feelings of palpitations and nervousness. .  Continue Cardizem 120CD 120 mg daily    Continue xarelto for embolic stroke risk mitigation

## 2018-11-09 NOTE — Assessment & Plan Note (Signed)
Well controlled on current regimen. Renal function stable, no changes today. Given her past reaction to losartan,  And multiple prior intolerances,  No medication  changes were made today.   Lab Results  Component Value Date   CREATININE 0.80 11/07/2018   Lab Results  Component Value Date   NA 140 11/07/2018   K 4.0 11/07/2018   CL 106 11/07/2018   CO2 21 11/07/2018

## 2018-11-28 ENCOUNTER — Other Ambulatory Visit: Payer: Self-pay | Admitting: Pharmacist

## 2018-11-28 MED ORDER — RIVAROXABAN 20 MG PO TABS: 20.0000 mg | ORAL_TABLET | Freq: Every day | ORAL | 0 refills | Status: DC

## 2018-11-28 NOTE — Progress Notes (Signed)
Age 85, weight 66kg, SCr 0.8 on 11/07/18 CrCl 39mL/min. Has not been seen since 2018 but has f/u scheduled with MD on 7/2

## 2018-12-03 ENCOUNTER — Telehealth: Payer: Self-pay | Admitting: Cardiovascular Disease

## 2018-12-03 NOTE — Telephone Encounter (Signed)

## 2018-12-04 ENCOUNTER — Encounter: Payer: Self-pay | Admitting: Cardiovascular Disease

## 2018-12-04 ENCOUNTER — Ambulatory Visit (INDEPENDENT_AMBULATORY_CARE_PROVIDER_SITE_OTHER): Payer: PPO | Admitting: Cardiovascular Disease

## 2018-12-04 ENCOUNTER — Other Ambulatory Visit: Payer: Self-pay

## 2018-12-04 VITALS — BP 130/64 | HR 72 | Temp 97.1°F | Ht 64.0 in | Wt 143.0 lb

## 2018-12-04 DIAGNOSIS — I10 Essential (primary) hypertension: Secondary | ICD-10-CM | POA: Diagnosis not present

## 2018-12-04 DIAGNOSIS — I482 Chronic atrial fibrillation, unspecified: Secondary | ICD-10-CM | POA: Diagnosis not present

## 2018-12-04 NOTE — Progress Notes (Signed)
Cardiology Office Note   Date:  12/04/2018   ID:  Laurie [name], DOB [birth year], MRN [id number]  PCP:  [name], [name], MD  Cardiologist:   Lorine BearsMuhammad Zoiey Christy, MD   Chief Complaint  Patient presents with  . other    12 month follow up. Meds reviewed by the pt. verbally. "doing well."       History of Present Illness: Laurie [name] is a 85 y.o. female who presents for a followup visit regarding chronic atrial fibrillation.  She is being treated with diltiazem for rate control. Echocardiogram in 2013 showed normal LV systolic function and mildly dilated left atrium. She is on anticoagulation with Xarelto.  She did not tolerate higher doses of diltiazem due to fatigue.  She is doing well on current dose of 120 mg once daily.  She has been doing well with no chest pain, shortness of breath or palpitations.   Past Medical History:  Diagnosis Date  . Arrhythmia   . Hypertension     Past Surgical History:  Procedure Laterality Date  . TONSILLECTOMY       Current Outpatient Medications  Medication Sig Dispense Refill  . Calcium-Vitamin D (CALCET PO) Take 2 tablets by mouth daily.     . chlorpheniramine (CHLOR-TRIMETON) 4 MG tablet Take 4 mg by mouth 2 (two) times daily as needed.    . diltiazem (CARDIZEM CD) 120 MG 24 hr capsule Take 1 capsule (120 mg total) by mouth daily. 30 capsule 2  . fish oil-omega-3 fatty acids 1000 MG capsule Take 1 g by mouth daily.     . fluticasone (FLONASE) 50 MCG/ACT nasal spray Place 2 sprays into both nostrils daily. 16 g 6  . lisinopril (ZESTRIL) 40 MG tablet Take 1 tablet (40 mg total) by mouth 2 (two) times daily. 180 tablet 0  . rivaroxaban (XARELTO) 20 MG TABS tablet Take 1 tablet (20 mg total) by mouth daily. 90 tablet 0  . Vitamin D, Cholecalciferol, 400 units TABS Take by mouth.     No current facility-administered medications for this visit.     Allergies:   Hydrochlorothiazide, Losartan, Metoprolol, and Triamterene    Social  History:  The patient  reports that she has never smoked. She has never used smokeless tobacco. She reports that she does not drink alcohol or use drugs.   Family History:  The patient's family history includes Anemia in her mother; Heart disease (age of onset: 4572) in her father; Kidney disease in her mother.    ROS:  Please see the history of present illness.   Otherwise, review of systems are positive for none.   All other systems are reviewed and negative.    PHYSICAL EXAM: VS:  Temp (!) 97.1 F (36.2 C)   Ht 5\' 4"  (1.626 m)   Wt 143 lb (64.9 kg)   BMI 24.55 kg/m  , BMI Body mass index is 24.55 kg/m. GEN: Well nourished, well developed, in no acute distress  HEENT: normal  Neck: no JVD, carotid bruits, or masses Cardiac: Irregularly irregular; no murmurs, rubs, or gallops,no edema  Respiratory:  clear to auscultation bilaterally, normal work of breathing GI: soft, nontender, nondistended, + BS MS: no deformity or atrophy  Skin: warm and dry, no rash Neuro:  Strength and sensation are intact Psych: euthymic mood, full affect   EKG:  EKG is ordered today. The ekg ordered today demonstrates atrial fibrillation with ventricular rate of 72 bpm.   Recent Labs: 01/29/2018: TSH  2.77 11/07/2018: ALT 12; BUN 15; Creat 0.80; Hemoglobin 14.1; Platelets 197; Potassium 4.0; Sodium 140    Lipid Panel    Component Value Date/Time   CHOL 232 (H) 11/07/2018 1502   TRIG 121 11/07/2018 1502   HDL 54 11/07/2018 1502   CHOLHDL 4.3 11/07/2018 1502   VLDL 35.8 01/29/2018 0911   LDLCALC 154 (H) 11/07/2018 1502   LDLDIRECT 148.0 01/29/2018 0911      Wt Readings from Last 3 Encounters:  12/04/18 143 lb (64.9 kg)  11/07/18 145 lb 6.4 oz (66 kg)  01/23/18 146 lb (66.2 kg)         ASSESSMENT AND PLAN:  1.  Chronic atrial fibrillation: Ventricular rate is well controlled on current dose of diltiazem extended release 120 mg once daily. She is tolerating anticoagulation with Xarelto. I  reviewed her labs done last month which were unremarkable.  She had normal kidney function and CBC. We did discuss the issue of cost today given that she is paying $50 per month for Xarelto.  I discussed the alternatives including warfarin but I think she has been doing well on Xarelto and I recommend being the same medication.  2. Essential hypertension: Blood pressure is reasonably controlled on current medications.    Disposition:   FU with me in 1 year  Signed,  Kathlyn [place], MD  12/04/2018 10:00 AM    [place]

## 2018-12-04 NOTE — Patient Instructions (Signed)
Medication Instructions:  Your physician recommends that you continue on your current medications as directed. Please refer to the Current Medication list given to you today.  If you need a refill on your cardiac medications before your next appointment, please call your pharmacy.   Lab work: None ordered If you have labs (blood work) drawn today and your tests are completely normal, you will receive your results only by: . MyChart Message (if you have MyChart) OR . A paper copy in the mail If you have any lab test that is abnormal or we need to change your treatment, we will call you to review the results.  Testing/Procedures: None ordered  Follow-Up: At [facility], you and your health needs are our priority.  As part of our continuing mission to provide you with exceptional heart care, we have created designated Provider Care Teams.  These Care Teams include your primary Cardiologist (physician) and Advanced Practice Providers (APPs -  Physician Assistants and Nurse Practitioners) who all work together to provide you with the care you need, when you need it. You will need a follow up appointment in 12 months.  Please call our office 2 months in advance to schedule this appointment.  You may seeDr.Arida or one of the following Advanced Practice Providers on your designated Care Team:   Christopher Berge, NP Ryan Dunn, PA-C . Jacquelyn Visser, PA-C    

## 2019-01-20 ENCOUNTER — Other Ambulatory Visit: Payer: Self-pay | Admitting: Internal Medicine

## 2019-01-20 MED ORDER — DILTIAZEM HCL ER COATED BEADS 120 MG PO CP24: 120.0000 mg | ORAL_CAPSULE | Freq: Every day | ORAL | 1 refills | Status: DC

## 2019-01-20 NOTE — Telephone Encounter (Signed)
Another Epic flaw.  Medication  gets crossed out when it runs out.  You have to review the note to see that it was to be continued  (July 2 note from [place])   I refilled for 90 days , 1 refill . needs a 6 month follow up with me set up at appropriate time

## 2019-01-20 NOTE — Telephone Encounter (Signed)
Looks like this medication has been discontinued by Cardiology.

## 2019-01-22 ENCOUNTER — Other Ambulatory Visit: Payer: Self-pay | Admitting: Cardiovascular Disease

## 2019-01-22 ENCOUNTER — Other Ambulatory Visit: Payer: Self-pay | Admitting: Internal Medicine

## 2019-01-22 ENCOUNTER — Telehealth: Payer: Self-pay

## 2019-01-22 NOTE — Telephone Encounter (Signed)
Copied from [place] [phone]. Topic: General - Other >> Jan 22, 2019  9:42 AM Leward Quan A wrote: Reason for CRM: Patient called to say that she waited for the Phone call from [facility], [facility], [facility] but was a little upset that she did not get the call. Please contact patient at Ph# [phone]

## 2019-01-22 NOTE — Telephone Encounter (Signed)
Patient stated she was told the appointment was on 01/21/19 at 9:00.  I informed patient appointment was scheduled for 01/28/19 at 9:00 and apologized for any inconvenience in miscommunication this has caused. Patient kindly agreed to keep already scheduled appointment with nurse health advisor.  See appointment changes for review of handlers.

## 2019-01-23 NOTE — Telephone Encounter (Signed)
No change to meds.  Prior reaction to losartan.  Med refilled

## 2019-01-23 NOTE — Telephone Encounter (Signed)
refill request for lisinopril

## 2019-01-23 NOTE — Telephone Encounter (Signed)
Xarelto 20mg  refill request received; pt is 85 yrs old, wt-64.9kg, Crea-0.80 on 11/07/2018, last seen by Dr. Fletcher Anon on 12/04/2018, Diagnosis Afib, CrCl-52.58ml/min; will send in refill to requested pharmacy.

## 2019-01-23 NOTE — Telephone Encounter (Signed)
Please review for refill. Thanks!  

## 2019-01-28 ENCOUNTER — Ambulatory Visit: Payer: PPO | Admitting: Internal Medicine

## 2019-01-28 ENCOUNTER — Ambulatory Visit (INDEPENDENT_AMBULATORY_CARE_PROVIDER_SITE_OTHER): Payer: PPO

## 2019-01-28 ENCOUNTER — Other Ambulatory Visit: Payer: Self-pay

## 2019-01-28 DIAGNOSIS — Z0000 Encounter for general adult medical examination without abnormal findings: Secondary | ICD-10-CM | POA: Diagnosis not present

## 2019-01-28 NOTE — Progress Notes (Addendum)
Subjective:   Laurie [name] is a 85 y.o. female who presents for Medicare Annual (Subsequent) preventive examination.  Review of Systems:  No ROS.  Medicare Wellness Virtual Visit.  Visual/audio telehealth visit, UTA vital signs.   See social history for additional risk factors.   Cardiac Risk Factors include: advanced age (>73men, >80 women);hypertension     Objective:     Vitals: There were no vitals taken for this visit.  There is no height or weight on file to calculate BMI.  Advanced Directives 01/28/2019 01/23/2018 08/29/2016  Does Patient Have a Medical Advance Directive? Yes Yes Yes  Type of Diplomatic Services operational officer Living will Healthcare Power of [place];Living will  Does patient want to make changes to medical advance directive? No - Patient declined No - Patient declined No - Patient declined  Copy of Healthcare Power of Attorney in Chart? No - copy requested - No - copy requested    Tobacco Social History   Tobacco Use  Smoking Status Never Smoker  Smokeless Tobacco Never Used     Counseling given: Not Answered   Clinical Intake:  Pre-visit preparation completed: Yes        Diabetes: No  How often do you need to have someone help you when you read instructions, pamphlets, or other written materials from your doctor or pharmacy?: 1 - Never  Interpreter Needed?: No     Past Medical History:  Diagnosis Date  . Arrhythmia   . Hypertension    Past Surgical History:  Procedure Laterality Date  . TONSILLECTOMY     Family History  Problem Relation Age of Onset  . Kidney disease Mother   . Anemia Mother   . Heart disease Father 77   Social History   Socioeconomic History  . Marital status: Widowed    Spouse name: Not on file  . Number of children: Not on file  . Years of education: Not on file  . Highest education level: Not on file  Occupational History  . Not on file  Social Needs  . Financial resource strain:  Not hard at all  . Food insecurity    Worry: Never true    Inability: Never true  . Transportation needs    Medical: No    Non-medical: No  Tobacco Use  . Smoking status: Never Smoker  . Smokeless tobacco: Never Used  Substance and Sexual Activity  . Alcohol use: No  . Drug use: No  . Sexual activity: Never  Lifestyle  . Physical activity    Days per week: 0 days    Minutes per session: Not on file  . Stress: Not at all  Relationships  . Social Musician on phone: Not on file    Gets together: Not on file    Attends religious service: Not on file    Active member of club or organization: Not on file    Attends meetings of clubs or organizations: Not on file    Relationship status: Not on file  Other Topics Concern  . Not on file  Social History Narrative  . Not on file    Outpatient Encounter Medications as of 01/28/2019  Medication Sig  . Calcium Carb-Cholecalciferol (CALCIUM 600+D3 PO) Take 1 tablet by mouth daily.  . chlorpheniramine (CHLOR-TRIMETON) 4 MG tablet Take 4 mg by mouth 2 (two) times daily as needed.  . Cholecalciferol (D3-1000 PO) Take 1 tablet by mouth daily.  Marland Kitchen diltiazem (CARDIZEM  CD) 120 MG 24 hr capsule Take 1 capsule (120 mg total) by mouth daily.  . fish oil-omega-3 fatty acids 1000 MG capsule Take 1 g by mouth daily.   . fluticasone (FLONASE) 50 MCG/ACT nasal spray Place 2 sprays into both nostrils daily.  Marland Kitchen lisinopril (ZESTRIL) 40 MG tablet TAKE 1 TABLET BY MOUTH TWICE A DAY  . XARELTO 20 MG TABS tablet TAKE 1 TABLET BY MOUTH EVERY DAY  . [DISCONTINUED] Calcium-Vitamin D (CALCET PO) Take 2 tablets by mouth daily.   . [DISCONTINUED] Vitamin D, Cholecalciferol, 400 units TABS Take by mouth.   No facility-administered encounter medications on file as of 01/28/2019.     Activities of Daily Living In your present state of health, do you have any difficulty performing the following activities: 01/28/2019  Hearing? N  Vision? N  Difficulty  concentrating or making decisions? N  Walking or climbing stairs? N  Dressing or bathing? N  Doing errands, shopping? N  Preparing Food and eating ? N  Using the Toilet? N  In the past six months, have you accidently leaked urine? N  Do you have problems with loss of bowel control? N  Managing your Medications? N  Managing your Finances? N  Housekeeping or managing your Housekeeping? N  Some recent data might be hidden    Patient Care Team: Crecencio Mc, MD as PCP - General (Internal Medicine)    Assessment:   This is a routine wellness examination for Laurie [name].  I connected with patient 01/28/19 at  9:00 AM EDT by an audio enabled telemedicine application and verified that I am speaking with the correct person using two identifiers. Patient stated full name and DOB. Patient gave permission to continue with virtual visit. Patient's location was at home and Nurse's location was at [place] office.   Health Maintenance Due: Update all pending maintenance due as appropriate.   See completed HM at the end of note.   Eye: Visual acuity not assessed. Virtual visit. Wears corrective lenses. Followed by their ophthalmologist every 12 months.   Dental: Visits every 12 months.    Hearing: Demonstrates normal hearing during visit.  Safety:  Patient feels safe at home- yes Security system- yes Patient does have smoke detectors at home- yes Patient does wear sunscreen or protective clothing when in direct sunlight - yes Patient does wear seat belt when in a moving vehicle - yes Patient drives- no Adequate lighting in walkways free from debris- yes Grab bars and handrails used as appropriate- yes Ambulates with no assistive device Cell phone on person when ambulating outside of the home- yes  Social: Alcohol intake - no   Smoking history- never   Smokers in home? none Illicit drug use? none  Depression: PHQ 2 &9 complete. See screening below. Denies irritability, anhedonia,  sadness/tearfullness.  Stable.   Falls: See screening below.    Medication: Taking as directed and without issues.   Covid-19: Precautions and sickness symptoms discussed. Wears mask, social distancing, hand hygiene as appropriate.   Activities of Daily Living Patient denies needing assistance with: household chores, feeding themselves, getting from bed to chair, getting to the toilet, bathing/showering, dressing, managing money, or preparing meals.   Memory: Patient is alert. Patient denies difficulty focusing or concentrating. Correctly identified the president of the [place], season and recall. Patient likes to read the Bible for brain stimulation.  BMI- discussed the importance of a healthy diet, water intake and the benefits of aerobic exercise.  Educational material provided.  Physical activity- active around the home, walking, standing exercises  Diet: low cholesterol/low carb Water: good intake  Advanced Directive: End of life planning; Advance aging; Advanced directives discussed.  Copy of current HCPOA/Living Will requested.    Other Providers Patient Care Team: [name], [name], MD as PCP - General (Internal Medicine)  Exercise Activities and Dietary recommendations Current Exercise Habits: The patient does not participate in regular exercise at present  Goals    . Low Cholesterol Diet     Eat less simple sugars Eat less fat Educational material provided       Fall Risk Fall Risk  01/28/2019 01/23/2018 09/23/2017 08/29/2016 01/06/2015  Falls in the past year? 0 No No No No   Timed Get Up and Go performed: no, virtual visit  Depression Screen PHQ 2/9 Scores 01/28/2019 01/23/2018 09/23/2017 08/29/2016  PHQ - 2 Score 0 0 0 0  PHQ- 9 Score - - 0 -     Cognitive Function MMSE - Mini Mental State Exam 01/23/2018 08/29/2016  Orientation to time 5 5  Orientation to Place 5 5  Registration 3 3  Attention/ Calculation 5 5  Recall 3 3  Language- name 2 objects 2 2   Language- repeat 1 1  Language- follow 3 step command 3 3  Language- read & follow direction 1 1  Write a sentence 1 1  Copy design 1 1  Total score 30 30     6CIT Screen 01/28/2019  What Year? 0 points  What month? 0 points  What time? 0 points  Count back from 20 0 points  Months in reverse 0 points  Repeat phrase 0 points  Total Score 0    Immunization History  Administered Date(s) Administered  . Pneumococcal Conjugate-13 07/07/2014  . Pneumococcal Polysaccharide-23 03/29/2010, 03/26/2016  . Tdap 03/29/2010  . Zoster 03/10/2010    Screening Tests Health Maintenance  Topic Date Due  . TETANUS/TDAP  03/29/2020  . DEXA SCAN  Completed  . PNA vac Low Risk Adult  Completed  . INFLUENZA VACCINE  Discontinued  . MAMMOGRAM  Discontinued       Plan:   Keep all routine maintenance appointments.   Follow up 05/14/19   I have personally reviewed and noted the following in the patient's chart:   . Medical and social history . Use of alcohol, tobacco or illicit drugs  . Current medications and supplements . Functional ability and status . Nutritional status . Physical activity . Advanced directives . List of other physicians . Hospitalizations, surgeries, and ER visits in previous 12 months . Vitals . Screenings to include cognitive, depression, and falls . Referrals and appointments  In addition, I have reviewed and discussed with patient certain preventive protocols, quality metrics, and best practice recommendations. A written personalized care plan for preventive services as well as general preventive health recommendations were provided to patient.     OBrien-Blaney, Mykeisha Dysert L, LPN  1/61/09608/26/2020     I have reviewed the above information and agree with above.   Duncan Dulleresa Tullo, MD

## 2019-01-28 NOTE — Patient Instructions (Addendum)
  Laurie [name] , Thank you for taking time to come for your Medicare Wellness Visit. I appreciate your ongoing commitment to your health goals. Please review the following plan we discussed and let me know if I can assist you in the future.   These are the goals we discussed: Goals    . Low Cholesterol Diet     Eat less simple sugars Eat less fat Educational material provided       This is a list of the screening recommended for you and due dates:  Health Maintenance  Topic Date Due  . Tetanus Vaccine  03/29/2020  . DEXA scan (bone density measurement)  Completed  . Pneumonia vaccines  Completed  . Flu Shot  Discontinued  . Mammogram  Discontinued

## 2019-04-05 ENCOUNTER — Other Ambulatory Visit: Payer: Self-pay | Admitting: Internal Medicine

## 2019-04-06 NOTE — Telephone Encounter (Signed)
Refill request for lisinopril. The diltiazem was discontinued at cardiology on 12/04/2018.

## 2019-04-06 NOTE — Telephone Encounter (Signed)
Lisinopril refilled.

## 2019-04-15 ENCOUNTER — Telehealth: Payer: Self-pay | Admitting: *Deleted

## 2019-04-15 ENCOUNTER — Other Ambulatory Visit: Payer: Self-pay | Admitting: Family Medicine

## 2019-04-15 ENCOUNTER — Ambulatory Visit (INDEPENDENT_AMBULATORY_CARE_PROVIDER_SITE_OTHER): Payer: PPO | Admitting: Family Medicine

## 2019-04-15 ENCOUNTER — Encounter: Payer: Self-pay | Admitting: Family Medicine

## 2019-04-15 VITALS — Ht 64.0 in | Wt 140.0 lb

## 2019-04-15 DIAGNOSIS — N3001 Acute cystitis with hematuria: Secondary | ICD-10-CM | POA: Diagnosis not present

## 2019-04-15 DIAGNOSIS — R350 Frequency of micturition: Secondary | ICD-10-CM

## 2019-04-15 DIAGNOSIS — N3281 Overactive bladder: Secondary | ICD-10-CM | POA: Insufficient documentation

## 2019-04-15 LAB — URINALYSIS, MICROSCOPIC ONLY

## 2019-04-15 LAB — POCT URINALYSIS DIP (MANUAL ENTRY)
Bilirubin, UA: NEGATIVE
Glucose, UA: NEGATIVE mg/dL
Ketones, POC UA: NEGATIVE mg/dL
Nitrite, UA: NEGATIVE
Protein Ur, POC: NEGATIVE mg/dL
Spec Grav, UA: 1.01 (ref 1.010–1.025)
Urobilinogen, UA: 0.2 E.U./dL
pH, UA: 5.5 (ref 5.0–8.0)

## 2019-04-15 MED ORDER — CEPHALEXIN 500 MG PO CAPS: 500.0000 mg | ORAL_CAPSULE | Freq: Two times a day (BID) | ORAL | 0 refills | Status: DC

## 2019-04-15 NOTE — Progress Notes (Signed)
Virtual Visit via telephone Note  This visit type was conducted due to national recommendations for restrictions regarding the COVID-19 pandemic (e.g. social distancing).  This format is felt to be most appropriate for this patient at this time.  All issues noted in this document were discussed and addressed.  No physical exam was performed (except for noted visual exam findings with Video Visits).   I connected with Laurie [name] today at  1:30 PM EST by a video enabled telemedicine application or telephone and verified that I am speaking with the correct person using two identifiers. Location patient: [place] provider: work Persons participating in the virtual visit: patient, provider  I discussed the limitations, risks, security and privacy concerns of performing an evaluation and management service by telephone and the availability of in person appointments. I also discussed with the patient that there may be a patient responsible charge related to this service. The patient expressed understanding and agreed to proceed.  Interactive audio and video telecommunications were attempted between this provider and patient, however failed, due to patient having technical difficulties OR patient did not have access to video capability.  We continued and completed visit with audio only.  Reason for visit: same day visit   HPI: UTI: Onset of symptoms yesterday. Reports similar to 2 years ago when she was treated for a UTI.  Dysuria- no  Frequency- yes   Urgency- yes   Hematuria- no   Fever- no  Abd pain- no   Vaginal d/c- no She denies any new medications or supplements.  She denies history of diabetes.  No polydipsia.   ROS: See pertinent positives and negatives per HPI.  Past Medical History:  Diagnosis Date  . Arrhythmia   . Hypertension     Past Surgical History:  Procedure Laterality Date  . TONSILLECTOMY      Family History  Problem Relation Age of Onset  . Kidney  disease Mother   . Anemia Mother   . Heart disease Father 4    SOCIAL HX: nonsmoker   Current Outpatient Medications:  .  Calcium Carb-Cholecalciferol (CALCIUM 600+D3 PO), Take 1 tablet by mouth daily., Disp: , Rfl:  .  chlorpheniramine (CHLOR-TRIMETON) 4 MG tablet, Take 4 mg by mouth 2 (two) times daily as needed., Disp: , Rfl:  .  Cholecalciferol (D3-1000 PO), Take 1 tablet by mouth daily., Disp: , Rfl:  .  fish oil-omega-3 fatty acids 1000 MG capsule, Take 1 g by mouth daily. , Disp: , Rfl:  .  fluticasone (FLONASE) 50 MCG/ACT nasal spray, Place 2 sprays into both nostrils daily., Disp: 16 g, Rfl: 6 .  lisinopril (ZESTRIL) 40 MG tablet, TAKE 1 TABLET BY MOUTH TWICE A DAY, Disp: 180 tablet, Rfl: 0 .  XARELTO 20 MG TABS tablet, TAKE 1 TABLET BY MOUTH EVERY DAY, Disp: 90 tablet, Rfl: 2  EXAM: This is a telehealth telephone visit and thus no physical exam was completed.  ASSESSMENT AND PLAN:  Discussed the following assessment and plan:  Urine frequency Concern for UTI.  We will check a urinalysis and then consider treatment versus waiting on a culture.  She will come in to provide this urine sample.    I discussed the assessment and treatment plan with the patient. The patient was provided an opportunity to ask questions and all were answered. The patient agreed with the plan and demonstrated an understanding of the instructions.   The patient was advised to call back or seek an in-person  evaluation if the symptoms worsen or if the condition fails to improve as anticipated.  I provided 7 minutes of non-face-to-face time during this encounter.   Tommi Rumps, MD

## 2019-04-15 NOTE — Telephone Encounter (Signed)
Noted.  Plan to evaluate with virtual visit.

## 2019-04-15 NOTE — Telephone Encounter (Signed)
Tried to schedule patient for in office none available when patient can come to office scheduled virtual with dr. Caryl Bis patient experiencing frequent urination stated she no other symptoms but has hair appt. Advised would need lab work uranalysis before appt patient refused d vised would need to attain after appt , patient voiced understanding.

## 2019-04-15 NOTE — Addendum Note (Signed)
Addended by: Leeanne Rio on: 04/15/2019 02:34 PM   Modules accepted: Orders

## 2019-04-15 NOTE — Addendum Note (Signed)
Addended by: Zannie Cove on: 04/15/2019 02:05 PM   Modules accepted: Orders

## 2019-04-15 NOTE — Assessment & Plan Note (Signed)
Concern for UTI.  We will check a urinalysis and then consider treatment versus waiting on a culture.  She will come in to provide this urine sample.

## 2019-04-15 NOTE — Telephone Encounter (Signed)
Copied from [facility] [id number]. Topic: General - Other >> Apr 15, 2019  9:50 AM Carolyn Stare wrote:  Pt said she has a bladder infection and req appt today or tomorrow, no answer at the office

## 2019-04-17 LAB — URINE CULTURE
MICRO NUMBER:: 1089330
SPECIMEN QUALITY:: ADEQUATE

## 2019-04-20 ENCOUNTER — Telehealth: Payer: Self-pay | Admitting: Internal Medicine

## 2019-04-20 MED ORDER — MIRABEGRON ER 25 MG PO TB24: 25.0000 mg | ORAL_TABLET | Freq: Every day | ORAL | 3 refills | Status: DC

## 2019-04-20 NOTE — Telephone Encounter (Signed)
I am sending generric Myrbetriq to your pharmacy to try  For the urinary urgency.  It is taken one time daily .  All of the medications for overactive bladder can cause dry mouth and constipation .  This one is newer and seems to be better tolerated than the others

## 2019-04-20 NOTE — Telephone Encounter (Signed)
Result note read to patient; verbalizes understanding. States she "Feels much better " But with frequency and urgency. Pt questioning if there is anything she can take for the urgency. Denies dysuria.  CB# cell [phone]  Telephone encounter as results not routed to [facility]

## 2019-04-21 ENCOUNTER — Other Ambulatory Visit: Payer: Self-pay | Admitting: Internal Medicine

## 2019-04-21 ENCOUNTER — Telehealth: Payer: Self-pay | Admitting: Internal Medicine

## 2019-04-21 NOTE — Telephone Encounter (Signed)
Left message for patient to return call to office. [facility] nurse may advise. 

## 2019-04-21 NOTE — Telephone Encounter (Signed)
Note from Dr. Derrel Nip read to patient. Aware med was sent to pharmacy.  Pt verbalizes understanding.

## 2019-04-21 NOTE — Telephone Encounter (Signed)
Refill request for lisinopril and it looks like the cardizem was stopped by cardiology.

## 2019-04-22 ENCOUNTER — Telehealth: Payer: Self-pay | Admitting: Internal Medicine

## 2019-04-22 NOTE — Telephone Encounter (Signed)
Left message for patient to return call to office. 

## 2019-04-22 NOTE — Telephone Encounter (Addendum)
The need for retesting was because of blood in the urine,  So she does need to submit another urine for analysis,  But not to rule out infection ,  To rule out blood

## 2019-04-22 NOTE — Telephone Encounter (Signed)
Patient called and was read lab note of Dr Caryl Bis on 04/17/2019.  She states the burning with urination has stopped.  She has received a RX for frequency hand has just taken her first dose. Please advise patient if there is a need to retest.

## 2019-04-22 NOTE — Telephone Encounter (Signed)
Pt no longer having burning with urination. Would like to know if you wanted to recheck her urine?

## 2019-04-23 NOTE — Telephone Encounter (Signed)
LMTCB

## 2019-04-27 ENCOUNTER — Other Ambulatory Visit: Payer: Self-pay

## 2019-04-28 ENCOUNTER — Other Ambulatory Visit (INDEPENDENT_AMBULATORY_CARE_PROVIDER_SITE_OTHER): Payer: PPO

## 2019-04-28 ENCOUNTER — Telehealth: Payer: Self-pay

## 2019-04-28 ENCOUNTER — Other Ambulatory Visit: Payer: Self-pay

## 2019-04-28 DIAGNOSIS — R319 Hematuria, unspecified: Secondary | ICD-10-CM

## 2019-04-28 LAB — URINALYSIS, ROUTINE W REFLEX MICROSCOPIC
Bilirubin Urine: NEGATIVE
Ketones, ur: NEGATIVE
Leukocytes,Ua: NEGATIVE
Nitrite: NEGATIVE
Specific Gravity, Urine: 1.025 (ref 1.000–1.030)
Total Protein, Urine: NEGATIVE
Urine Glucose: NEGATIVE
Urobilinogen, UA: 0.2 (ref 0.0–1.0)
pH: 6 (ref 5.0–8.0)

## 2019-04-28 NOTE — Telephone Encounter (Signed)
Order placed for lab appt today.

## 2019-04-28 NOTE — Telephone Encounter (Signed)
Repeat UA today

## 2019-05-14 ENCOUNTER — Telehealth: Payer: Self-pay

## 2019-05-14 ENCOUNTER — Telehealth: Payer: Self-pay | Admitting: Internal Medicine

## 2019-05-14 ENCOUNTER — Ambulatory Visit (INDEPENDENT_AMBULATORY_CARE_PROVIDER_SITE_OTHER): Payer: PPO | Admitting: Internal Medicine

## 2019-05-14 ENCOUNTER — Encounter: Payer: Self-pay | Admitting: Internal Medicine

## 2019-05-14 ENCOUNTER — Other Ambulatory Visit: Payer: Self-pay

## 2019-05-14 VITALS — Ht 64.0 in | Wt 140.0 lb

## 2019-05-14 DIAGNOSIS — R7303 Prediabetes: Secondary | ICD-10-CM

## 2019-05-14 DIAGNOSIS — Z7189 Other specified counseling: Secondary | ICD-10-CM

## 2019-05-14 DIAGNOSIS — I10 Essential (primary) hypertension: Secondary | ICD-10-CM | POA: Diagnosis not present

## 2019-05-14 DIAGNOSIS — I482 Chronic atrial fibrillation, unspecified: Secondary | ICD-10-CM | POA: Diagnosis not present

## 2019-05-14 DIAGNOSIS — R350 Frequency of micturition: Secondary | ICD-10-CM | POA: Diagnosis not present

## 2019-05-14 DIAGNOSIS — E782 Mixed hyperlipidemia: Secondary | ICD-10-CM

## 2019-05-14 MED ORDER — OXYBUTYNIN CHLORIDE ER 10 MG PO TB24: 10.0000 mg | ORAL_TABLET | Freq: Every day | ORAL | 0 refills | Status: DC

## 2019-05-14 NOTE — Telephone Encounter (Signed)
Patient would like Xarelto samples.  

## 2019-05-14 NOTE — Telephone Encounter (Signed)
Spoke with patient to inform her that we do not have any samples at this time.

## 2019-05-14 NOTE — Assessment & Plan Note (Signed)
Untreated secondary to age

## 2019-05-14 NOTE — Progress Notes (Signed)
Virtual Visit converted to Telephone Note  This visit type was conducted due to national recommendations for restrictions regarding the COVID-19 pandemic (e.g. social distancing).  This format is felt to be most appropriate for this patient at this time.  All issues noted in this document were discussed and addressed.  No physical exam was performed (except for noted visual exam findings with Video Visits).   I attempted to connect  with@ on 05/14/19 at  8:30 AM EST by a video enabled telemedicine application . Interactive audio and video telecommunications  ultimately failed, due to patient having technical difficulties. We continued and completed visit with audio only  and verified that I am speaking with the correct person using two identifiers Location patient: home Location provider: work or home office Persons participating in the virtual visit: patient, provider  I discussed the limitations, risks, security and privacy concerns of performing an evaluation and management service by telephone and the availability of in person appointments. I also discussed with the patient that there may be a patient responsible charge related to this service. The patient expressed understanding and agreed to proceed.  Reason for visit: FOLLOW UP  HPI:  85 yr old female with hypertension and chronic atrial fib , managed with lisinopril, diltiazem and xarelto.  She notes some bruising but not bleeding   HTN:  Patient is taking her medications as prescribed and notes no adverse effects.  Home BP readings have not  been done in the last six months .  She is avoiding added salt in her diet and walking regularly about 3 times per week for exercise  .  Urinary frequency improved with myrbetriq.No side effects but the cost OOP was $100/month! wants a cheaper  alternative   Is going to run out of xarelto 7 days before the year end  Cost is $175/month.  The cost will drop in January (she anticipates) Recommended  talking to Dr Fletcher Anon about requesting 7 days of samples.  Recommended NOT GOING WITHOUT FOR 7 DAYS,  But worse case scenario using it every other day for the last 2 weeks of the year.   The patient has no signs or symptoms of COVID 19 infection (fever, cough, sore throat  or shortness of breath beyond what is typical for patient).  Patient denies contact with other persons with the above mentioned symptoms or with anyone confirmed to have COVID 19   ROS: See pertinent positives and negatives per HPI.  Past Medical History:  Diagnosis Date  . Arrhythmia   . Hypertension     Past Surgical History:  Procedure Laterality Date  . TONSILLECTOMY      Family History  Problem Relation Age of Onset  . Kidney disease Mother   . Anemia Mother   . Heart disease Father 66    SOCIAL HX:  reports that she has never smoked. She has never used smokeless tobacco. She reports that she does not drink alcohol or use drugs.   Current Outpatient Medications:  .  Calcium Carb-Cholecalciferol (CALCIUM 600+D3 PO), Take 1 tablet by mouth daily., Disp: , Rfl:  .  chlorpheniramine (CHLOR-TRIMETON) 4 MG tablet, Take 4 mg by mouth 2 (two) times daily as needed., Disp: , Rfl:  .  Cholecalciferol (D3-1000 PO), Take 1 tablet by mouth daily., Disp: , Rfl:  .  diltiazem (TIAZAC) 120 MG 24 hr capsule, TAKE 1 CAPSULE BY MOUTH EVERY DAY, Disp: 90 capsule, Rfl: 1 .  fish oil-omega-3 fatty acids 1000 MG capsule, Take  1 g by mouth daily. , Disp: , Rfl:  .  lisinopril (ZESTRIL) 40 MG tablet, TAKE 1 TABLET BY MOUTH TWICE A DAY, Disp: 180 tablet, Rfl: 0 .  mirabegron ER (MYRBETRIQ) 25 MG TB24 tablet, Take 1 tablet (25 mg total) by mouth daily., Disp: 30 tablet, Rfl: 3 .  XARELTO 20 MG TABS tablet, TAKE 1 TABLET BY MOUTH EVERY DAY, Disp: 90 tablet, Rfl: 2 .  oxybutynin (DITROPAN XL) 10 MG 24 hr tablet, Take 1 tablet (10 mg total) by mouth at bedtime., Disp: 30 tablet, Rfl: 0  EXAM:   General impression: alert, cooperative  and articulate.  No signs of being in distress  Lungs: speech is fluent sentence length suggests that patient is not short of breath and not punctuated by cough, sneezing or sniffing. Marland Kitchen   Psych: affect normal.  speech is articulate and non pressured .  Denies suicidal thoughts   ASSESSMENT AND PLAN:  Discussed the following assessment and plan:  Prediabetes - Plan: Comprehensive metabolic panel, Hemoglobin A1c  Urine frequency  Mixed hyperlipidemia  Chronic atrial fibrillation (HCC)  Essential hypertension  Educated about COVID-19 virus infection  Urine frequency Improved with myrbetriq but cost is $100/month,  Switch to Detrol LA as a trial   Hyperlipidemia Untreated secondary to age   Atrial fibrillation Managed with  Cardizem 120CD 120 mg daily    Continue xarelto for embolic stroke risk mitigation.  Advised to contact Dr Kirke Corin for a 7 day supply of xarelto to get her through the end of the year.     Hypertension Well controlled on current regimen. Renal function assessment is due , no changes today. Given her past reaction to losartan,  And multiple prior intolerances,  She will continue lisinopril .   Lab Results  Component Value Date   CREATININE 0.80 11/07/2018   Lab Results  Component Value Date   NA 140 11/07/2018   K 4.0 11/07/2018   CL 106 11/07/2018   CO2 21 11/07/2018     Educated about COVID-19 virus infection Educated patient on the signs and symptoms of COVID-19 infection and ways to avoid the viral infection including washing hands frequently with soap and water,  using hand sanitizer if unable to wash, avoiding touching face,  staying at home and limiting visitors,  and avoiding contact with people coming in and out of home.  Reminded patient to call office with questions/concerns.  The importance of social distancing was discussed today     I discussed the assessment and treatment plan with the patient. The patient was provided an opportunity to  ask questions and all were answered. The patient agreed with the plan and demonstrated an understanding of the instructions.   The patient was advised to call back or seek an in-person evaluation if the symptoms worsen or if the condition fails to improve as anticipated.  I provided  25 minutes of non-face-to-face time during this encounter reviewing patient's current problems and past procedures/imaging studies, providing counseling on the above mentioned problems , and coordination  of care .   Sherlene Shams, MD

## 2019-05-14 NOTE — Assessment & Plan Note (Signed)
Improved with myrbetriq but cost is $100/month,  Switch to Detrol LA as a trial

## 2019-05-14 NOTE — Patient Instructions (Signed)
I am prescribing a cheaper bladder medication called Detrol LA.  It should  Be only $4 /month  If you do not tolerate side effects, let me know and we will try another

## 2019-05-14 NOTE — Telephone Encounter (Signed)
Lm on vm to call office to make a non-fasting lab and nv for a BP check in Dec., also schedule a 73m follow up.

## 2019-05-17 DIAGNOSIS — Z7189 Other specified counseling: Secondary | ICD-10-CM | POA: Insufficient documentation

## 2019-05-17 NOTE — Assessment & Plan Note (Signed)
Managed with  Cardizem 120CD 120 mg daily    Continue xarelto for embolic stroke risk mitigation.  Advised to contact Dr Fletcher Anon for a 7 day supply of xarelto to get her through the end of the year.

## 2019-05-17 NOTE — Assessment & Plan Note (Signed)
Well controlled on current regimen. Renal function assessment is due , no changes today. Given her past reaction to losartan,  And multiple prior intolerances,  She will continue lisinopril .   Lab Results  Component Value Date   CREATININE 0.80 11/07/2018   Lab Results  Component Value Date   NA 140 11/07/2018   K 4.0 11/07/2018   CL 106 11/07/2018   CO2 21 11/07/2018

## 2019-05-17 NOTE — Assessment & Plan Note (Signed)
Educated patient on the signs and symptoms of COVID-19 infection and ways to avoid the viral infection including washing hands frequently with soap and water,  using hand sanitizer if unable to wash, avoiding touching face,  staying at home and limiting visitors,  and avoiding contact with people coming in and out of home.  Reminded patient to call office with questions/concerns.  The importance of social distancing was discussed today 

## 2019-05-21 ENCOUNTER — Other Ambulatory Visit: Payer: PPO

## 2019-06-03 ENCOUNTER — Other Ambulatory Visit: Payer: Self-pay

## 2019-06-09 ENCOUNTER — Other Ambulatory Visit: Payer: Self-pay

## 2019-06-09 ENCOUNTER — Other Ambulatory Visit (INDEPENDENT_AMBULATORY_CARE_PROVIDER_SITE_OTHER): Payer: Medicare HMO

## 2019-06-09 DIAGNOSIS — R7303 Prediabetes: Secondary | ICD-10-CM | POA: Diagnosis not present

## 2019-06-09 LAB — COMPREHENSIVE METABOLIC PANEL
ALT: 15 U/L (ref 0–35)
AST: 20 U/L (ref 0–37)
Albumin: 4.3 g/dL (ref 3.5–5.2)
Alkaline Phosphatase: 65 U/L (ref 39–117)
BUN: 17 mg/dL (ref 6–23)
CO2: 25 mEq/L (ref 19–32)
Calcium: 9.9 mg/dL (ref 8.4–10.5)
Chloride: 101 mEq/L (ref 96–112)
Creatinine, Ser: 0.77 mg/dL (ref 0.40–1.20)
GFR: 71.03 mL/min (ref >60.00)
Glucose, Bld: 98 mg/dL (ref 70–99)
Potassium: 3.7 mEq/L (ref 3.5–5.1)
Sodium: 138 mEq/L (ref 135–145)
Total Bilirubin: 0.9 mg/dL (ref 0.2–1.2)
Total Protein: 7.1 g/dL (ref 6.0–8.3)

## 2019-06-09 LAB — HEMOGLOBIN A1C: Hgb A1c MFr Bld: 6 % (ref 4.6–6.5)

## 2019-06-12 ENCOUNTER — Other Ambulatory Visit: Payer: Self-pay | Admitting: Internal Medicine

## 2019-06-25 DIAGNOSIS — H524 Presbyopia: Secondary | ICD-10-CM | POA: Diagnosis not present

## 2019-06-25 DIAGNOSIS — Z961 Presence of intraocular lens: Secondary | ICD-10-CM | POA: Diagnosis not present

## 2019-06-25 DIAGNOSIS — Z0100 Encounter for examination of eyes and vision without abnormal findings: Secondary | ICD-10-CM | POA: Diagnosis not present

## 2019-07-28 DIAGNOSIS — R69 Illness, unspecified: Secondary | ICD-10-CM | POA: Diagnosis not present

## 2019-08-25 ENCOUNTER — Other Ambulatory Visit: Payer: Self-pay | Admitting: Internal Medicine

## 2019-09-08 ENCOUNTER — Telehealth: Payer: Self-pay | Admitting: Internal Medicine

## 2019-09-08 NOTE — Telephone Encounter (Signed)
PA approved through 06/03/2020

## 2019-09-08 NOTE — Telephone Encounter (Signed)
Faxed PA for [facility] Case # [id number]

## 2019-09-19 ENCOUNTER — Other Ambulatory Visit: Payer: Self-pay | Admitting: Cardiovascular Disease

## 2019-09-21 NOTE — Telephone Encounter (Signed)
Refill Request.  

## 2019-11-01 ENCOUNTER — Other Ambulatory Visit: Payer: Self-pay | Admitting: Internal Medicine

## 2019-11-11 ENCOUNTER — Ambulatory Visit (INDEPENDENT_AMBULATORY_CARE_PROVIDER_SITE_OTHER): Payer: Medicare HMO | Admitting: Internal Medicine

## 2019-11-11 ENCOUNTER — Encounter: Payer: Self-pay | Admitting: Internal Medicine

## 2019-11-11 ENCOUNTER — Other Ambulatory Visit: Payer: Self-pay

## 2019-11-11 VITALS — BP 154/70 | HR 79 | Temp 97.2°F | Resp 15 | Ht 64.0 in | Wt 139.8 lb

## 2019-11-11 DIAGNOSIS — R2241 Localized swelling, mass and lump, right lower limb: Secondary | ICD-10-CM

## 2019-11-11 DIAGNOSIS — R350 Frequency of micturition: Secondary | ICD-10-CM | POA: Diagnosis not present

## 2019-11-11 DIAGNOSIS — I482 Chronic atrial fibrillation, unspecified: Secondary | ICD-10-CM

## 2019-11-11 DIAGNOSIS — E782 Mixed hyperlipidemia: Secondary | ICD-10-CM

## 2019-11-11 DIAGNOSIS — R7303 Prediabetes: Secondary | ICD-10-CM

## 2019-11-11 DIAGNOSIS — I10 Essential (primary) hypertension: Secondary | ICD-10-CM

## 2019-11-11 LAB — LIPID PANEL
Cholesterol: 234 mg/dL — ABNORMAL HIGH (ref 0–200)
HDL: 56.3 mg/dL (ref >39.00)
LDL Cholesterol: 149 mg/dL — ABNORMAL HIGH (ref 0–99)
NonHDL: 177.35
Total CHOL/HDL Ratio: 4
Triglycerides: 140 mg/dL (ref 0.0–149.0)
VLDL: 28 mg/dL (ref 0.0–40.0)

## 2019-11-11 LAB — COMPREHENSIVE METABOLIC PANEL
ALT: 15 U/L (ref 0–35)
AST: 19 U/L (ref 0–37)
Albumin: 4.5 g/dL (ref 3.5–5.2)
Alkaline Phosphatase: 67 U/L (ref 39–117)
BUN: 15 mg/dL (ref 6–23)
CO2: 28 mEq/L (ref 19–32)
Calcium: 10.4 mg/dL (ref 8.4–10.5)
Chloride: 104 mEq/L (ref 96–112)
Creatinine, Ser: 0.74 mg/dL (ref 0.40–1.20)
GFR: 74.29 mL/min (ref >60.00)
Glucose, Bld: 99 mg/dL (ref 70–99)
Potassium: 4.1 mEq/L (ref 3.5–5.1)
Sodium: 139 mEq/L (ref 135–145)
Total Bilirubin: 0.8 mg/dL (ref 0.2–1.2)
Total Protein: 7 g/dL (ref 6.0–8.3)

## 2019-11-11 LAB — HEMOGLOBIN A1C: Hgb A1c MFr Bld: 6.2 % (ref 4.6–6.5)

## 2019-11-11 MED ORDER — DILTIAZEM HCL ER 180 MG PO CP24: 180.0000 mg | ORAL_CAPSULE | Freq: Every day | ORAL | 1 refills | Status: DC

## 2019-11-11 NOTE — Patient Instructions (Signed)
Here is a low carb cereal:  Try the [employer]akes cereal available at [facility] on the bottom shelf, with vanilla flavored almond milk  Your blood pressure is above goal. Our goal is 130-140/80  And for your pulse , > 60 but < 90  I am increasing your diltiazem to 180 mg daily   If the fatty tumor on your right shin continues to enlarge or starts to cause pain,  We will obtain a CT scan

## 2019-11-11 NOTE — Progress Notes (Signed)
Subjective:  Patient ID: Laurie [name], female    DOB: [birth year]  Age: 86 y.o. MRN: [id number]  CC: The primary encounter diagnosis was Mixed hyperlipidemia. Diagnoses of Prediabetes, Subcutaneous mass of right lower extremity, Chronic atrial fibrillation ([place]), Essential hypertension, and Urine frequency were also pertinent to this visit.  HPI Laurie [name] presents for follow up on hypertension and atrial fibrillation  Hypertension: patient checks blood pressure twice weekly at home.  Readings have been for the most part >140/80 at rest . Patient is following a reduced salt diet most days and is taking medications as prescribed.  She feels generally well and is sleeping well,  Appetite is fine and no constipation or urinary symptoms.    Atrial fibrillation:  Her  Resting heart rate has been > 80  And her energy level is fine.  She denies dyspnea with mild exertion and at rest  She denies any bleeding episodes  She has noted a painless swelling of the right lower shin that was not the result of trauma or surgery . It has not changed in months    Outpatient Medications Prior to Visit  Medication Sig Dispense Refill  . Calcium Carb-Cholecalciferol (CALCIUM 600+D3 PO) Take 1 tablet by mouth daily.    . chlorpheniramine (CHLOR-TRIMETON) 4 MG tablet Take 4 mg by mouth 2 (two) times daily as needed.    . Cholecalciferol (D3-1000 PO) Take 1 tablet by mouth daily.    . fish oil-omega-3 fatty acids 1000 MG capsule Take 1 g by mouth daily.     Marland Kitchen lisinopril (ZESTRIL) 40 MG tablet TAKE 1 TABLET BY MOUTH TWICE A DAY 180 tablet 0  . oxybutynin (DITROPAN-XL) 10 MG 24 hr tablet TAKE 1 TABLET BY MOUTH EVERYDAY AT BEDTIME 90 tablet 1  . XARELTO 20 MG TABS tablet TAKE 1 TABLET BY MOUTH EVERY DAY 90 tablet 1  . mirabegron ER (MYRBETRIQ) 25 MG TB24 tablet Take 1 tablet (25 mg total) by mouth daily. 30 tablet 3  . TIADYLT ER 120 MG 24 hr capsule TAKE 1 CAPSULE BY MOUTH EVERY DAY 90 capsule 1   No  facility-administered medications prior to visit.    Review of Systems;  Patient denies headache, fevers, malaise, unintentional weight loss, skin rash, eye pain, sinus congestion and sinus pain, sore throat, dysphagia,  hemoptysis , cough, dyspnea, wheezing, chest pain, palpitations, orthopnea, edema, abdominal pain, nausea, melena, diarrhea, constipation, flank pain, dysuria, hematuria, urinary  Frequency, nocturia, numbness, tingling, seizures,  Focal weakness, Loss of consciousness,  Tremor, insomnia, depression, anxiety, and suicidal ideation.      Objective:  BP (!) 154/70 (BP Location: Left Arm, Patient Position: Sitting, Cuff Size: Normal)   Pulse 79   Temp (!) 97.2 F (36.2 C) (Temporal)   Resp 15   Ht 5\' 4"  (1.626 m)   Wt 139 lb 12.8 oz (63.4 kg)   SpO2 99%   BMI 24.00 kg/m   BP Readings from Last 3 Encounters:  11/11/19 (!) 154/70  12/04/18 130/64  11/07/18 122/84    Wt Readings from Last 3 Encounters:  11/11/19 139 lb 12.8 oz (63.4 kg)  05/14/19 140 lb (63.5 kg)  04/15/19 140 lb (63.5 kg)    General appearance: alert, cooperative and appears stated age Ears: normal TM's and external ear canals both ears Throat: lips, mucosa, and tongue normal; teeth and gums normal Neck: no adenopathy, no carotid bruit, supple, symmetrical, trachea midline and thyroid not enlarged, symmetric, no tenderness/mass/nodules Back: symmetric, no  curvature. ROM normal. No CVA tenderness. Lungs: clear to auscultation bilaterally Heart: irreg irreg,   S1, S2 normal, no murmur, click, rub or gallop Abdomen: soft, non-tender; bowel sounds normal; no masses,  no organomegaly Pulses: 2+ and symmetric Skin: Skin color, texture, turgor normal. No rashes or lesions Ext: right shin with subcutaneous mobile mass c/w/  ma Lymph nodes: Cervical, supraclavicular, and axillary nodes normal.  Lab Results  Component Value Date   HGBA1C 6.2 11/11/2019   HGBA1C 6.0 06/09/2019   HGBA1C 5.8 (H)  11/07/2018    Lab Results  Component Value Date   CREATININE 0.74 11/11/2019   CREATININE 0.77 06/09/2019   CREATININE 0.80 11/07/2018    Lab Results  Component Value Date   WBC 5.9 11/07/2018   HGB 14.1 11/07/2018   HCT 41.9 11/07/2018   PLT 197 11/07/2018   GLUCOSE 99 11/11/2019   CHOL 234 (H) 11/11/2019   TRIG 140.0 11/11/2019   HDL 56.30 11/11/2019   LDLDIRECT 148.0 01/29/2018   LDLCALC 149 (H) 11/11/2019   ALT 15 11/11/2019   AST 19 11/11/2019   NA 139 11/11/2019   K 4.1 11/11/2019   CL 104 11/11/2019   CREATININE 0.74 11/11/2019   BUN 15 11/11/2019   CO2 28 11/11/2019   TSH 2.77 01/29/2018   INR 1.0 11/16/2011   HGBA1C 6.2 11/11/2019   MICROALBUR 0.8 04/16/2017     Assessment & Plan:   Problem List Items Addressed This Visit      Unprioritized   Atrial fibrillation (HCC)    Increase cardizem dose today for elevated BP and pulse to 180 mg daily . continue Xarelto      Relevant Medications   diltiazem (DILACOR XR) 180 MG 24 hr capsule   Hyperlipidemia - Primary   Relevant Medications   diltiazem (DILACOR XR) 180 MG 24 hr capsule   Other Relevant Orders   Lipid panel (Completed)   Comprehensive metabolic panel (Completed)   Hypertension    Increase cardizem dose to 180 mg daily given elevated readings and max dose of lisinopril       Relevant Medications   diltiazem (DILACOR XR) 180 MG 24 hr capsule   Subcutaneous mass of right lower extremity    Exam suggestive of lipoma ,  Imaging offered ,  Would require CT   Deferred for now by patient.       Urine frequency    Improved with myrbetriq but cost is $100/month,  Tolerating Switch to Detrol LA       Other Visit Diagnoses    Prediabetes       Relevant Orders   Hemoglobin A1c (Completed)      I have discontinued Leiah M. Edmiston's mirabegron ER and Tiadylt ER. I am also having her start on diltiazem. Additionally, I am having her maintain her fish oil-omega-3 fatty acids, chlorpheniramine,  Calcium Carb-Cholecalciferol (CALCIUM 600+D3 PO), Cholecalciferol (D3-1000 PO), oxybutynin, lisinopril, and Xarelto.  Meds ordered this encounter  Medications  . diltiazem (DILACOR XR) 180 MG 24 hr capsule    Sig: Take 1 capsule (180 mg total) by mouth daily.    Dispense:  30 capsule    Refill:  1    Medications Discontinued During This Encounter  Medication Reason  . TIADYLT ER 120 MG 24 hr capsule   . mirabegron ER (MYRBETRIQ) 25 MG TB24 tablet     Follow-up: No follow-ups on file.   Sherlene Shams, MD

## 2019-11-13 DIAGNOSIS — R2241 Localized swelling, mass and lump, right lower limb: Secondary | ICD-10-CM | POA: Insufficient documentation

## 2019-11-13 NOTE — Assessment & Plan Note (Signed)
Increase cardizem dose to 180 mg daily given elevated readings and max dose of lisinopril

## 2019-11-13 NOTE — Assessment & Plan Note (Signed)
Exam suggestive of lipoma ,  Imaging offered ,  Would require CT   Deferred for now by patient.

## 2019-11-13 NOTE — Assessment & Plan Note (Signed)
Improved with myrbetriq but cost is $100/month,  Tolerating Switch to Detrol LA

## 2019-11-13 NOTE — Assessment & Plan Note (Addendum)
Increase cardizem dose today for elevated BP and pulse to 180 mg daily . continue Xarelto

## 2019-12-06 ENCOUNTER — Other Ambulatory Visit: Payer: Self-pay | Admitting: Internal Medicine

## 2019-12-14 ENCOUNTER — Other Ambulatory Visit: Payer: Self-pay | Admitting: Internal Medicine

## 2020-01-13 DIAGNOSIS — R300 Dysuria: Secondary | ICD-10-CM | POA: Diagnosis not present

## 2020-01-13 DIAGNOSIS — A499 Bacterial infection, unspecified: Secondary | ICD-10-CM | POA: Diagnosis not present

## 2020-01-13 DIAGNOSIS — N390 Urinary tract infection, site not specified: Secondary | ICD-10-CM | POA: Diagnosis not present

## 2020-01-14 ENCOUNTER — Ambulatory Visit: Payer: Medicare HMO | Admitting: Nurse Practitioner

## 2020-01-29 ENCOUNTER — Ambulatory Visit: Payer: PPO

## 2020-01-29 ENCOUNTER — Ambulatory Visit: Payer: Medicare HMO | Admitting: Internal Medicine

## 2020-02-02 ENCOUNTER — Encounter: Payer: Self-pay | Admitting: Internal Medicine

## 2020-02-02 ENCOUNTER — Ambulatory Visit (INDEPENDENT_AMBULATORY_CARE_PROVIDER_SITE_OTHER): Payer: Medicare HMO | Admitting: Internal Medicine

## 2020-02-02 ENCOUNTER — Other Ambulatory Visit: Payer: Self-pay

## 2020-02-02 VITALS — BP 140/72 | HR 78 | Temp 98.6°F | Resp 15 | Ht 64.0 in | Wt 140.8 lb

## 2020-02-02 DIAGNOSIS — I10 Essential (primary) hypertension: Secondary | ICD-10-CM | POA: Diagnosis not present

## 2020-02-02 DIAGNOSIS — Z789 Other specified health status: Secondary | ICD-10-CM

## 2020-02-02 DIAGNOSIS — R7303 Prediabetes: Secondary | ICD-10-CM

## 2020-02-02 DIAGNOSIS — N3281 Overactive bladder: Secondary | ICD-10-CM | POA: Diagnosis not present

## 2020-02-02 DIAGNOSIS — E782 Mixed hyperlipidemia: Secondary | ICD-10-CM

## 2020-02-02 DIAGNOSIS — I482 Chronic atrial fibrillation, unspecified: Secondary | ICD-10-CM | POA: Diagnosis not present

## 2020-02-02 MED ORDER — OXYBUTYNIN CHLORIDE ER 5 MG PO TB24: 5.0000 mg | ORAL_TABLET | Freq: Every day | ORAL | 2 refills | Status: DC

## 2020-02-02 NOTE — Assessment & Plan Note (Signed)
Reduce dose of oxybutynin dose of  oxybutynin to 5 mg daily to mitigate symptoms of dry mouth .  If no change,  Will try the short acting formula

## 2020-02-02 NOTE — Assessment & Plan Note (Signed)
Rate controlled but still in atrial fib.  Continue dilt xr and xarelto

## 2020-02-02 NOTE — Progress Notes (Signed)
Subjective:  Patient ID: Laurie [name], female    DOB: 09-05-1930  Age: 86 y.o. MRN: [id number]  CC: The primary encounter diagnosis was Mixed hyperlipidemia. Diagnoses of Overactive bladder, Prediabetes, Chronic atrial fibrillation (HCC), Essential hypertension, and Statin intolerance were also pertinent to this visit.  HPI BERKLEE BATTEY presents for follow up on chronic issues   This visit occurred during the SARS-CoV-2 public health emergency.  Safety protocols were in place, including screening questions prior to the visit, additional usage of staff PPE, and extensive cleaning of exam room while observing appropriate contact time as indicated for disinfecting solutions.     Patient has received NO  doses of the available COVID 19 vaccines due to concern about the effect of non FDA approved mRNA vaccines on her health  .   Patient continues to mask when outside of the home except when walking in yard or at safe distances from others .  Patient denies any change in mood or development of unhealthy behaviors resuting from the pandemic's restriction of activities and socialization.   OAB:  Taking oxybutynin XL 10 mg causes dry mouth.  Even taken every other day.   Nocturia x3 ,  Urgency still a problems  Does not like the side effects of oxybutynin.  Dry mouth  Was treated for UTI on august 11 .symptoms resolved   .HTN: Patient is taking her medications as prescribed and notes no adverse effects.  Home BP readings have been done about once per week and are  generally < 140/80 .  She is avoiding added salt in her diet and walking regularly about 3 times per week for exercise  .  Atrial fib:  Denies dyspnea and palpitations.  No bleeding issues on xarelto    Outpatient Medications Prior to Visit  Medication Sig Dispense Refill  . chlorpheniramine (CHLOR-TRIMETON) 4 MG tablet Take 4 mg by mouth 2 (two) times daily as needed.    . Cholecalciferol (D3-1000 PO) Take 1 tablet by mouth  daily.    Marland Kitchen DILT-XR 180 MG 24 hr capsule TAKE 1 CAPSULE BY MOUTH EVERY DAY 30 capsule 1  . fish oil-omega-3 fatty acids 1000 MG capsule Take 1 g by mouth daily.     Marland Kitchen lisinopril (ZESTRIL) 40 MG tablet TAKE 1 TABLET BY MOUTH TWICE A DAY 180 tablet 0  . XARELTO 20 MG TABS tablet TAKE 1 TABLET BY MOUTH EVERY DAY 90 tablet 1  . oxybutynin (DITROPAN-XL) 10 MG 24 hr tablet TAKE 1 TABLET BY MOUTH EVERYDAY AT BEDTIME 90 tablet 1  . Calcium Carb-Cholecalciferol (CALCIUM 600+D3 PO) Take 1 tablet by mouth daily. (Patient not taking: Reported on 02/02/2020)     No facility-administered medications prior to visit.    Review of Systems;  Patient denies headache, fevers, malaise, unintentional weight loss, skin rash, eye pain, sinus congestion and sinus pain, sore throat, dysphagia,  hemoptysis , cough, dyspnea, wheezing, chest pain, palpitations, orthopnea, edema, abdominal pain, nausea, melena, diarrhea, constipation, flank pain, dysuria, hematuria, , numbness, tingling, seizures,  Focal weakness, Loss of consciousness,  Tremor, insomnia, depression, anxiety, and suicidal ideation.      Objective:  BP 140/72 (BP Location: Left Arm, Patient Position: Sitting, Cuff Size: Normal)   Pulse 78   Temp 98.6 F (37 C) (Oral)   Resp 15   Ht 5\' 4"  (1.626 m)   Wt 140 lb 12.8 oz (63.9 kg)   SpO2 98%   BMI 24.17 kg/m   BP Readings from Last  3 Encounters:  02/02/20 140/72  11/11/19 (!) 154/70  12/04/18 130/64    Wt Readings from Last 3 Encounters:  02/02/20 140 lb 12.8 oz (63.9 kg)  11/11/19 139 lb 12.8 oz (63.4 kg)  05/14/19 140 lb (63.5 kg)    General appearance: alert, cooperative and appears stated age Ears: normal TM's and external ear canals both ears Throat: lips, mucosa, and tongue normal; teeth and gums normal Neck: no adenopathy, no carotid bruit, supple, symmetrical, trachea midline and thyroid not enlarged, symmetric, no tenderness/mass/nodules Back: symmetric, no curvature. ROM normal.  No CVA tenderness. Lungs: clear to auscultation bilaterally Heart: irregularly irregular S1, S2 normal, no murmur, click, rub or gallop Abdomen: soft, non-tender; bowel sounds normal; no masses,  no organomegaly Pulses: 2+ and symmetric Skin: Skin color, texture, turgor normal. No rashes or lesions Lymph nodes: Cervical, supraclavicular, and axillary nodes normal.  Lab Results  Component Value Date   HGBA1C 6.2 11/11/2019   HGBA1C 6.0 06/09/2019   HGBA1C 5.8 (H) 11/07/2018    Lab Results  Component Value Date   CREATININE 0.74 11/11/2019   CREATININE 0.77 06/09/2019   CREATININE 0.80 11/07/2018    Lab Results  Component Value Date   WBC 5.9 11/07/2018   HGB 14.1 11/07/2018   HCT 41.9 11/07/2018   PLT 197 11/07/2018   GLUCOSE 99 11/11/2019   CHOL 234 (H) 11/11/2019   TRIG 140.0 11/11/2019   HDL 56.30 11/11/2019   LDLDIRECT 148.0 01/29/2018   LDLCALC 149 (H) 11/11/2019   ALT 15 11/11/2019   AST 19 11/11/2019   NA 139 11/11/2019   K 4.1 11/11/2019   CL 104 11/11/2019   CREATININE 0.74 11/11/2019   BUN 15 11/11/2019   CO2 28 11/11/2019   TSH 2.77 01/29/2018   INR 1.0 11/16/2011   HGBA1C 6.2 11/11/2019   MICROALBUR 0.8 04/16/2017    US Venous Img Lower Unilateral Left  Result Date: 09/20/2016 CLINICAL DATA:  Left leg swelling for 1.5 weeks.  No known injury. EXAM: LEFT LOWER EXTREMITY VENOUS DOPPLER ULTRASOUND TECHNIQUE: Gray-scale sonography with graded compression, as well as color Doppler and duplex ultrasound were performed to evaluate the lower extremity deep venous systems from the level of the common femoral vein and including the common femoral, femoral, profunda femoral, popliteal and calf veins including the posterior tibial, peroneal and gastrocnemius veins when visible. The superficial great saphenous vein was also interrogated. Spectral Doppler was utilized to evaluate flow at rest and with distal augmentation maneuvers in the common femoral, femoral and  popliteal veins. COMPARISON:  None. FINDINGS: Contralateral Common Femoral Vein: Respiratory phasicity is normal and symmetric with the symptomatic side. No evidence of thrombus. Normal compressibility. Common Femoral Vein: No evidence of thrombus. Normal compressibility, respiratory phasicity and response to augmentation. Saphenofemoral Junction: No evidence of thrombus. Normal compressibility and flow on color Doppler imaging. Profunda Femoral Vein: No evidence of thrombus. Normal compressibility and flow on color Doppler imaging. Femoral Vein: No evidence of thrombus. Normal compressibility, respiratory phasicity and response to augmentation. Popliteal Vein: No evidence of thrombus. Normal compressibility, respiratory phasicity and response to augmentation. Calf Veins: No evidence of thrombus. Normal compressibility and flow on color Doppler imaging. Superficial Great Saphenous Vein: No evidence of thrombus. Normal compressibility and flow on color Doppler imaging. Venous Reflux:  None. Other Findings: Baker's cyst with some septations measures 5.5 x 1.6 x 3.6 cm. IMPRESSION: Negative for deep venous thrombosis. Baker's cyst. Electronically Signed   By: Drusilla Kanner M.D.   On:  09/20/2016 15:35    Assessment & Plan:   Problem List Items Addressed This Visit      Unprioritized   Atrial fibrillation (HCC)    Rate controlled but still in atrial fib.  Continue dilt xr and xarelto       Hyperlipidemia - Primary   Relevant Orders   Lipid panel   Hypertension    Well controlled on current regimen for age.  Renal function stable, no changes today.      Overactive bladder    Reduce dose of oxybutynin dose of  oxybutynin to 5 mg daily to mitigate symptoms of dry mouth .  If no change,  Will try the short acting formula      Statin intolerance    She has deferred future trials of statin therapy due to myalgias caused by lowest dose of pravastatin  Lab Results  Component Value Date   CHOL 234  (H) 11/11/2019   HDL 56.30 11/11/2019   LDLCALC 149 (H) 11/11/2019   LDLDIRECT 148.0 01/29/2018   TRIG 140.0 11/11/2019   CHOLHDL 4 11/11/2019          Other Visit Diagnoses    Prediabetes       Relevant Orders   Hemoglobin A1c   Comprehensive metabolic panel     I provided  30 minutes of  face-to-face time during this encounter reviewing patient's current problems and past surgeries, labs and imaging studies, providing counseling on the above mentioned problems , and coordination  of care .  I have discontinued Jaime M. Raphael's Calcium Carb-Cholecalciferol (CALCIUM 600+D3 PO) and oxybutynin. I am also having her start on oxybutynin. Additionally, I am having her maintain her fish oil-omega-3 fatty acids, chlorpheniramine, Cholecalciferol (D3-1000 PO), Xarelto, lisinopril, and Dilt-XR.  Meds ordered this encounter  Medications  . oxybutynin (DITROPAN-XL) 5 MG 24 hr tablet    Sig: Take 1 tablet (5 mg total) by mouth at bedtime.    Dispense:  30 tablet    Refill:  2    Medications Discontinued During This Encounter  Medication Reason  . Calcium Carb-Cholecalciferol (CALCIUM 600+D3 PO)   . oxybutynin (DITROPAN-XL) 10 MG 24 hr tablet     Follow-up: No follow-ups on file.   Sherlene Shams, MD

## 2020-02-02 NOTE — Patient Instructions (Addendum)
   I have reduced your bladder medication to 5 mg .  If this helps the overactive bladder and reduces the dry mouth issue ,  You can stay on it forever.  If it is not tolerated let me know and we will try the short acting 5 mg or 10 mg dose   Return for fasting labs in December  i'll see you in March

## 2020-02-02 NOTE — Assessment & Plan Note (Signed)
She has deferred future trials of statin therapy due to myalgias caused by lowest dose of pravastatin  Lab Results  Component Value Date   CHOL 234 (H) 11/11/2019   HDL 56.30 11/11/2019   LDLCALC 149 (H) 11/11/2019   LDLDIRECT 148.0 01/29/2018   TRIG 140.0 11/11/2019   CHOLHDL 4 11/11/2019

## 2020-02-02 NOTE — Assessment & Plan Note (Signed)
Well controlled on current regimen for age.  Renal function stable, no changes today.

## 2020-02-04 ENCOUNTER — Encounter: Payer: Self-pay | Admitting: Cardiovascular Disease

## 2020-02-04 ENCOUNTER — Other Ambulatory Visit: Payer: Self-pay

## 2020-02-04 ENCOUNTER — Ambulatory Visit: Payer: Medicare HMO | Admitting: Cardiovascular Disease

## 2020-02-04 VITALS — BP 130/70 | HR 67 | Ht 62.5 in | Wt 142.0 lb

## 2020-02-04 DIAGNOSIS — I482 Chronic atrial fibrillation, unspecified: Secondary | ICD-10-CM | POA: Diagnosis not present

## 2020-02-04 DIAGNOSIS — E782 Mixed hyperlipidemia: Secondary | ICD-10-CM | POA: Diagnosis not present

## 2020-02-04 DIAGNOSIS — I10 Essential (primary) hypertension: Secondary | ICD-10-CM

## 2020-02-04 NOTE — Progress Notes (Signed)
Cardiology Office Note   Date:  02/04/2020   ID:  Laurie, [name] [birth year], MRN [id number]  PCP:  Sherlene Shams, MD  Cardiologist:   Lorine Bears, MD   Chief Complaint  Patient presents with   Other    12 month follow up. meds reviewed verbally with patient.       History of Present Illness: Laurie [name] is a 86 y.o. female who presents for a followup visit regarding chronic atrial fibrillation.  She is being treated with diltiazem for rate control. Echocardiogram in 2013 showed normal LV systolic function and mildly dilated left atrium. She is on anticoagulation with Xarelto.   She has been doing very well with no recent chest pain, shortness of breath or palpitations.  She takes her medications regularly.  She pays $140 for 77-month supply of Xarelto.  She continues to be active in her local church and volunteers at [employer]   Past Medical History:  Diagnosis Date   Arrhythmia    Hypertension     Past Surgical History:  Procedure Laterality Date   TONSILLECTOMY       Current Outpatient Medications  Medication Sig Dispense Refill   chlorpheniramine (CHLOR-TRIMETON) 4 MG tablet Take 4 mg by mouth 2 (two) times daily as needed.     Cholecalciferol (D3-1000 PO) Take 1 tablet by mouth daily.     DILT-XR 180 MG 24 hr capsule TAKE 1 CAPSULE BY MOUTH EVERY DAY 30 capsule 1   fish oil-omega-3 fatty acids 1000 MG capsule Take 1 g by mouth daily.      lisinopril (ZESTRIL) 40 MG tablet TAKE 1 TABLET BY MOUTH TWICE A DAY 180 tablet 0   oxybutynin (DITROPAN-XL) 5 MG 24 hr tablet Take 1 tablet (5 mg total) by mouth at bedtime. 30 tablet 2   XARELTO 20 MG TABS tablet TAKE 1 TABLET BY MOUTH EVERY DAY 90 tablet 1   No current facility-administered medications for this visit.    Allergies:   Hydrochlorothiazide, Losartan, Metoprolol, and Triamterene    Social History:  The patient  reports that she has never smoked. She has never used smokeless  tobacco. She reports that she does not drink alcohol and does not use drugs.   Family History:  The patient's family history includes Anemia in her mother; Heart disease (age of onset: 49) in her father; Kidney disease in her mother.    ROS:  Please see the history of present illness.   Otherwise, review of systems are positive for none.   All other systems are reviewed and negative.    PHYSICAL EXAM: VS:  BP 130/70 (BP Location: Left Arm, Patient Position: Sitting, Cuff Size: Normal)    Pulse 67    Ht 5' 2.5" (1.588 m)    Wt 142 lb (64.4 kg)    SpO2 98%    BMI 25.56 kg/m  , BMI Body mass index is 25.56 kg/m. GEN: Well nourished, well developed, in no acute distress  HEENT: normal  Neck: no JVD, carotid bruits, or masses Cardiac: Irregularly irregular; no murmurs, rubs, or gallops,no edema  Respiratory:  clear to auscultation bilaterally, normal work of breathing GI: soft, nontender, nondistended, + BS MS: no deformity or atrophy  Skin: warm and dry, no rash Neuro:  Strength and sensation are intact Psych: euthymic mood, full affect   EKG:  EKG is ordered today. The ekg ordered today demonstrates atrial fibrillation with ventricular rate of 67 bpm.  Left  axis deviation with no significant ST or T wave changes.   Recent Labs: 11/11/2019: ALT 15; BUN 15; Creatinine, Ser 0.74; Potassium 4.1; Sodium 139    Lipid Panel    Component Value Date/Time   CHOL 234 (H) 11/11/2019 0959   TRIG 140.0 11/11/2019 0959   HDL 56.30 11/11/2019 0959   CHOLHDL 4 11/11/2019 0959   VLDL 28.0 11/11/2019 0959   LDLCALC 149 (H) 11/11/2019 0959   LDLCALC 154 (H) 11/07/2018 1502   LDLDIRECT 148.0 01/29/2018 0911      Wt Readings from Last 3 Encounters:  02/04/20 142 lb (64.4 kg)  02/02/20 140 lb 12.8 oz (63.9 kg)  11/11/19 139 lb 12.8 oz (63.4 kg)         ASSESSMENT AND PLAN:  1.  Chronic atrial fibrillation: Ventricular rate is well controlled on current dose of diltiazem extended release  180 mg once daily. She is tolerating anticoagulation with Xarelto. Recent labs showed normal renal function.  2. Essential hypertension: Blood pressure is reasonably controlled on current medications.  3.  Hyperlipidemia: Recent labs showed an LDL of 149.  She has no history of atherosclerosis.  No need to start medication.   Disposition:   FU with me in 1 year  Signed,  Lorine Bears, MD  02/04/2020 10:03 AM    [facility]

## 2020-02-04 NOTE — Patient Instructions (Signed)
Medication Instructions:  Your physician recommends that you continue on your current medications as directed. Please refer to the Current Medication list given to you today.  *If you need a refill on your cardiac medications before your next appointment, please call your pharmacy*   Lab Work: None ordered If you have labs (blood work) drawn today and your tests are completely normal, you will receive your results only by: . MyChart Message (if you have MyChart) OR . A paper copy in the mail If you have any lab test that is abnormal or we need to change your treatment, we will call you to review the results.   Testing/Procedures: None ordered   Follow-Up: At [facility], you and your health needs are our priority.  As part of our continuing mission to provide you with exceptional heart care, we have created designated Provider Care Teams.  These Care Teams include your primary Cardiologist (physician) and Advanced Practice Providers (APPs -  Physician Assistants and Nurse Practitioners) who all work together to provide you with the care you need, when you need it.  We recommend signing up for the patient portal called "MyChart".  Sign up information is provided on this After Visit Summary.  MyChart is used to connect with patients for Virtual Visits (Telemedicine).  Patients are able to view lab/test results, encounter notes, upcoming appointments, etc.  Non-urgent messages can be sent to your provider as well.   To learn more about what you can do with MyChart, go to [email or web address].[email or web address].    Your next appointment:   12 month(s)  The format for your next appointment:   In Person  Provider:    You may see Dr. Arida or one of the following Advanced Practice Providers on your designated Care Team:    Christopher Berge, NP  Ryan Dunn, PA-C  Jacquelyn Visser, PA-C    Other Instructions N/A  

## 2020-02-09 DIAGNOSIS — R69 Illness, unspecified: Secondary | ICD-10-CM | POA: Diagnosis not present

## 2020-03-01 ENCOUNTER — Other Ambulatory Visit: Payer: Self-pay | Admitting: Internal Medicine

## 2020-04-02 ENCOUNTER — Other Ambulatory Visit: Payer: Self-pay | Admitting: Internal Medicine

## 2020-04-05 ENCOUNTER — Telehealth: Payer: Self-pay | Admitting: Internal Medicine

## 2020-04-05 NOTE — Telephone Encounter (Addendum)
Patient called stated that she received a bill for 50.00 then another came for a 100.00 I informed patient that it was from another doctor she had test done with the cardiologist.

## 2020-04-05 NOTE — Telephone Encounter (Signed)
Left message for patient to call back and schedule Medicare Annual Wellness Visit (AWV)   This should be a telephone visit only=30 minutes.  Last AWV 01/28/19; please schedule at anytime with Denisa O'Brien-Blaney at [facility].

## 2020-04-11 ENCOUNTER — Ambulatory Visit (INDEPENDENT_AMBULATORY_CARE_PROVIDER_SITE_OTHER): Payer: Medicare HMO

## 2020-04-11 VITALS — Ht 62.5 in | Wt 142.0 lb

## 2020-04-11 DIAGNOSIS — Z0000 Encounter for general adult medical examination without abnormal findings: Secondary | ICD-10-CM

## 2020-04-11 NOTE — Patient Instructions (Addendum)
Laurie [name] , Thank you for taking time to come for your Medicare Wellness Visit. I appreciate your ongoing commitment to your health goals. Please review the following plan we discussed and let me know if I can assist you in the future.   These are the goals we discussed: Goals    . Maintain Healthy Lifestyle     Healthy diet Stay active       This is a list of the screening recommended for you and due dates:  Health Maintenance  Topic Date Due  . COVID-19 Vaccine (1) 06/03/2020*  . Tetanus Vaccine  04/11/2021*  . DEXA scan (bone density measurement)  Completed  . Pneumonia vaccines  Completed  . Flu Shot  Discontinued  . Mammogram  Discontinued  *Topic was postponed. The date shown is not the original due date.   Immunizations Immunization History  Administered Date(s) Administered  . Pneumococcal Conjugate-13 07/07/2014  . Pneumococcal Polysaccharide-23 03/29/2010, 03/26/2016  . Tdap 03/29/2010  . Zoster 03/10/2010   Keep all routine maintenance appointments.   Next scheduled lab 05/10/20 @ 9:45, non-fasting lab.   Follow up 05/12/20 @ 9:00  Next lab 06/02/20 @ 9:30   Follow up 08/31/20 @ 10:30  Advanced directives: End of life planning; Advance aging; Advanced directives discussed.  Copy of current HCPOA/Living Will requested.    Conditions/risks identified: none new.   Follow up in one year for your annual wellness visit.   Preventive Care 78 Years and Older, Female Preventive care refers to lifestyle choices and visits with your health care provider that can promote health and wellness. What does preventive care include?  A yearly physical exam. This is also called an annual well check.  Dental exams once or twice a year.  Routine eye exams. Ask your health care provider how often you should have your eyes checked.  Personal lifestyle choices, including:  Daily care of your teeth and gums.  Regular physical activity.  Eating a healthy  diet.  Avoiding tobacco and drug use.  Limiting alcohol use.  Practicing safe sex.  Taking low-dose aspirin every day.  Taking vitamin and mineral supplements as recommended by your health care provider. What happens during an annual well check? The services and screenings done by your health care provider during your annual well check will depend on your age, overall health, lifestyle risk factors, and family history of disease. Counseling  Your health care provider may ask you questions about your:  Alcohol use.  Tobacco use.  Drug use.  Emotional well-being.  Home and relationship well-being.  Sexual activity.  Eating habits.  History of falls.  Memory and ability to understand (cognition).  Work and work Astronomer.  Reproductive health. Screening  You may have the following tests or measurements:  Height, weight, and BMI.  Blood pressure.  Lipid and cholesterol levels. These may be checked every 5 years, or more frequently if you are over 23 years old.  Skin check.  Lung cancer screening. You may have this screening every year starting at age 71 if you have a 30-pack-year history of smoking and currently smoke or have quit within the past 15 years.  Fecal occult blood test (FOBT) of the stool. You may have this test every year starting at age 52.  Flexible sigmoidoscopy or colonoscopy. You may have a sigmoidoscopy every 5 years or a colonoscopy every 10 years starting at age 17.  Hepatitis C blood test.  Hepatitis B blood test.  Sexually transmitted disease (STD)  testing.  Diabetes screening. This is done by checking your blood sugar (glucose) after you have not eaten for a while (fasting). You may have this done every 1-3 years.  Bone density scan. This is done to screen for osteoporosis. You may have this done starting at age 76.  Mammogram. This may be done every 1-2 years. Talk to your health care provider about how often you should have  regular mammograms. Talk with your health care provider about your test results, treatment options, and if necessary, the need for more tests. Vaccines  Your health care provider may recommend certain vaccines, such as:  Influenza vaccine. This is recommended every year.  Tetanus, diphtheria, and acellular pertussis (Tdap, Td) vaccine. You may need a Td booster every 10 years.  Zoster vaccine. You may need this after age 97.  Pneumococcal 13-valent conjugate (PCV13) vaccine. One dose is recommended after age 69.  Pneumococcal polysaccharide (PPSV23) vaccine. One dose is recommended after age 74. Talk to your health care provider about which screenings and vaccines you need and how often you need them. This information is not intended to replace advice given to you by your health care provider. Make sure you discuss any questions you have with your health care provider. Document Released: 06/17/2015 Document Revised: 02/08/2016 Document Reviewed: 03/22/2015 Elsevier Interactive Patient Education  2017 [employer] Prevention in the Home Falls can cause injuries. They can happen to people of all ages. There are many things you can do to make your home safe and to help prevent falls. What can I do on the outside of my home?  Regularly fix the edges of walkways and driveways and fix any cracks.  Remove anything that might make you trip as you walk through a door, such as a raised step or threshold.  Trim any bushes or trees on the path to your home.  Use bright outdoor lighting.  Clear any walking paths of anything that might make someone trip, such as rocks or tools.  Regularly check to see if handrails are loose or broken. Make sure that both sides of any steps have handrails.  Any raised decks and porches should have guardrails on the edges.  Have any leaves, snow, or ice cleared regularly.  Use sand or salt on walking paths during winter.  Clean up any spills in your  garage right away. This includes oil or grease spills. What can I do in the bathroom?  Use night lights.  Install grab bars by the toilet and in the tub and shower. Do not use towel bars as grab bars.  Use non-skid mats or decals in the tub or shower.  If you need to sit down in the shower, use a plastic, non-slip stool.  Keep the floor dry. Clean up any water that spills on the floor as soon as it happens.  Remove soap buildup in the tub or shower regularly.  Attach bath mats securely with double-sided non-slip rug tape.  Do not have throw rugs and other things on the floor that can make you trip. What can I do in the bedroom?  Use night lights.  Make sure that you have a light by your bed that is easy to reach.  Do not use any sheets or blankets that are too big for your bed. They should not hang down onto the floor.  Have a firm chair that has side arms. You can use this for support while you get dressed.  Do not  have throw rugs and other things on the floor that can make you trip. What can I do in the kitchen?  Clean up any spills right away.  Avoid walking on wet floors.  Keep items that you use a lot in easy-to-reach places.  If you need to reach something above you, use a strong step stool that has a grab bar.  Keep electrical cords out of the way.  Do not use floor polish or wax that makes floors slippery. If you must use wax, use non-skid floor wax.  Do not have throw rugs and other things on the floor that can make you trip. What can I do with my stairs?  Do not leave any items on the stairs.  Make sure that there are handrails on both sides of the stairs and use them. Fix handrails that are broken or loose. Make sure that handrails are as long as the stairways.  Check any carpeting to make sure that it is firmly attached to the stairs. Fix any carpet that is loose or worn.  Avoid having throw rugs at the top or bottom of the stairs. If you do have throw  rugs, attach them to the floor with carpet tape.  Make sure that you have a light switch at the top of the stairs and the bottom of the stairs. If you do not have them, ask someone to add them for you. What else can I do to help prevent falls?  Wear shoes that:  Do not have high heels.  Have rubber bottoms.  Are comfortable and fit you well.  Are closed at the toe. Do not wear sandals.  If you use a stepladder:  Make sure that it is fully opened. Do not climb a closed stepladder.  Make sure that both sides of the stepladder are locked into place.  Ask someone to hold it for you, if possible.  Clearly mark and make sure that you can see:  Any grab bars or handrails.  First and last steps.  Where the edge of each step is.  Use tools that help you move around (mobility aids) if they are needed. These include:  Canes.  Walkers.  Scooters.  Crutches.  Turn on the lights when you go into a dark area. Replace any light bulbs as soon as they burn out.  Set up your furniture so you have a clear path. Avoid moving your furniture around.  If any of your floors are uneven, fix them.  If there are any pets around you, be aware of where they are.  Review your medicines with your doctor. Some medicines can make you feel dizzy. This can increase your chance of falling. Ask your doctor what other things that you can do to help prevent falls. This information is not intended to replace advice given to you by your health care provider. Make sure you discuss any questions you have with your health care provider. Document Released: 03/17/2009 Document Revised: 10/27/2015 Document Reviewed: 06/25/2014 Elsevier Interactive Patient Education  2017 [employer].

## 2020-04-11 NOTE — Progress Notes (Addendum)
Subjective:   Laurie [name] is a 86 y.o. female who presents for Medicare Annual (Subsequent) preventive examination.  Review of Systems    No ROS.  Medicare Wellness Virtual Visit.  Cardiac Risk Factors include: advanced age (>4655men, 13>65 women)     Objective:    Today's Vitals   04/11/20 1404  Weight: 142 lb (64.4 kg)  Height: 5' 2.5" (1.588 m)   Body mass index is 25.56 kg/m.  Advanced Directives 04/11/2020 01/28/2019 01/23/2018 08/29/2016  Does Patient Have a Medical Advance Directive? Yes Yes Yes Yes  Type of Sales promotion account executiveAdvance Directive Healthcare Power of Attorney Healthcare Power of Attorney Living will Healthcare Power of [place];Living will  Does patient want to make changes to medical advance directive? No - Patient declined No - Patient declined No - Patient declined No - Patient declined  Copy of Healthcare Power of Attorney in Chart? No - copy requested No - copy requested - No - copy requested    Current Medications (verified) Outpatient Encounter Medications as of 04/11/2020  Medication Sig   chlorpheniramine (CHLOR-TRIMETON) 4 MG tablet Take 4 mg by mouth 2 (two) times daily as needed.   Cholecalciferol (D3-1000 PO) Take 1 tablet by mouth daily.   DILT-XR 180 MG 24 hr capsule TAKE 1 CAPSULE BY MOUTH EVERY DAY   fish oil-omega-3 fatty acids 1000 MG capsule Take 1 g by mouth daily.    lisinopril (ZESTRIL) 40 MG tablet TAKE 1 TABLET BY MOUTH TWICE A DAY   oxybutynin (DITROPAN-XL) 5 MG 24 hr tablet Take 1 tablet (5 mg total) by mouth at bedtime.   XARELTO 20 MG TABS tablet TAKE 1 TABLET BY MOUTH EVERY DAY   No facility-administered encounter medications on file as of 04/11/2020.    Allergies (verified) Hydrochlorothiazide, Losartan, Metoprolol, and Triamterene   History: Past Medical History:  Diagnosis Date   Arrhythmia    Hypertension    Past Surgical History:  Procedure Laterality Date   TONSILLECTOMY     Family History  Problem Relation Age of Onset    Kidney disease Mother    Anemia Mother    Heart disease Father [id number]   Social History   Socioeconomic History   Marital status: Widowed    Spouse name: Not on file   Number of children: Not on file   Years of education: Not on file   Highest education level: Not on file  Occupational History   Not on file  Tobacco Use   Smoking status: Never Smoker   Smokeless tobacco: Never Used  Substance and Sexual Activity   Alcohol use: No   Drug use: No   Sexual activity: Never  Other Topics Concern   Not on file  Social History Narrative   Not on file   Social Determinants of Health   Financial Resource Strain: Low Risk    Difficulty of Paying Living Expenses: Not hard at all  Food Insecurity: No Food Insecurity   Worried About Running Out of Food in the Last Year: Never true   Ran Out of Food in the Last Year: Never true  Transportation Needs: No Transportation Needs   Lack of Transportation (Medical): No   Lack of Transportation (Non-Medical): No  Physical Activity:    Days of Exercise per Week: Not on file   Minutes of Exercise per Session: Not on file  Stress: No Stress Concern Present   Feeling of Stress : Not at all  Social Connections: Unknown   Frequency of Communication  with Friends and Family: More than three times a week   Frequency of Social Gatherings with Friends and Family: More than three times a week   Attends Religious Services: Not on file   Active Member of Clubs or Organizations: Not on file   Attends Banker Meetings: Not on file   Marital Status: Widowed    Tobacco Counseling Counseling given: Not Answered   Clinical Intake:  Pre-visit preparation completed: Yes        Diabetes: No  How often do you need to have someone help you when you read instructions, pamphlets, or other written materials from your doctor or pharmacy?: 1 - Never  Interpreter Needed?: No      Activities of Daily Living In your present state of health,  do you have any difficulty performing the following activities: 04/11/2020  Hearing? N  Vision? N  Difficulty concentrating or making decisions? N  Walking or climbing stairs? N  Dressing or bathing? N  Doing errands, shopping? N  Preparing Food and eating ? N  Using the Toilet? N  In the past six months, have you accidently leaked urine? N  Do you have problems with loss of bowel control? N  Managing your Medications? N  Managing your Finances? N  Housekeeping or managing your Housekeeping? N  Some recent data might be hidden    Patient Care Team: Sherlene Shams, MD as PCP - General (Internal Medicine)  Indicate any recent Medical Services you may have received from other than [facility] providers in the past year (date may be approximate).     Assessment:   This is a routine wellness examination for Laurie [name].  I connected with Laurie [name] today by telephone and verified that I am speaking with the correct person using two identifiers. Location patient: home Location provider: work Persons participating in the virtual visit: patient, Engineer, civil (consulting).    I discussed the limitations, risks, security and privacy concerns of performing an evaluation and management service by telephone and the availability of in person appointments. The patient expressed understanding and verbally consented to this telephonic visit.    Interactive audio and video telecommunications were attempted between this provider and patient, however failed, due to patient having technical difficulties OR patient did not have access to video capability.  We continued and completed visit with audio only.  Some vital signs may be absent or patient reported.   Hearing/Vision screen  Hearing Screening   125Hz  250Hz  500Hz  1000Hz  2000Hz  3000Hz  4000Hz  6000Hz  8000Hz   Right ear:           Left ear:           Comments: Patient is able to hear conversational tones without difficulty. No issues reported.  Vision Screening Comments: Wears  reading lenses  Cataract extraction, bilateral  Visual acuity not assessed, virtual visit. They have seen their ophthalmologist in the last 12 months.  Dietary issues and exercise activities discussed: Current Exercise Habits: Home exercise routine, Type of exercise: walking, Intensity: Mild  Healthy diet Good water intake  Goals      Maintain Healthy Lifestyle     Healthy diet Stay active       Depression Screen PHQ 2/9 Scores 04/11/2020 04/15/2019 01/28/2019 01/23/2018 09/23/2017 08/29/2016 01/06/2015  PHQ - 2 Score 0 0 0 0 0 0 0  PHQ- 9 Score - - - - 0 - -    Fall Risk Fall Risk  04/11/2020 02/02/2020 11/11/2019 05/14/2019 04/15/2019  Falls in the past year?  0 0 0 0 0  Number falls in past yr: 0 - - - 0  Follow up Falls evaluation completed Falls evaluation completed Falls evaluation completed Falls evaluation completed Falls evaluation completed   Handrails in use when climbing stairs? Yes Home free of loose throw rugs in walkways, pet beds, electrical cords, etc? Yes  Adequate lighting in your home to reduce risk of falls? Yes   ASSISTIVE DEVICES UTILIZED TO PREVENT FALLS: Use of a cane, walker or w/c? No   TIMED UP AND GO: Was the test performed? No . Virtual visit.   Cognitive Function: Patient is alert and oriented x3.  Denies difficulty focusing, making decisions, memory loss.  Enjoys Therapist, music and works 2 days weekly at [employer].  MMSE/6CIT deferred. Normal by direct communication/observation.   MMSE - Mini Mental State Exam 01/23/2018 08/29/2016  Orientation to time 5 5  Orientation to Place 5 5  Registration 3 3  Attention/ Calculation 5 5  Recall 3 3  Language- name 2 objects 2 2  Language- repeat 1 1  Language- follow 3 step command 3 3  Language- read & follow direction 1 1  Write a sentence 1 1  Copy design 1 1  Total score 30 30     6CIT Screen 01/28/2019  What Year? 0 points  What month? 0 points  What time? 0 points  Count  back from 20 0 points  Months in reverse 0 points  Repeat phrase 0 points  Total Score 0   Immunizations Immunization History  Administered Date(s) Administered   Pneumococcal Conjugate-13 07/07/2014   Pneumococcal Polysaccharide-23 03/29/2010, 03/26/2016   Tdap 03/29/2010   Zoster 03/10/2010   TDAP status: Due, Education has been provided regarding the importance of this vaccine. Advised may receive this vaccine at local pharmacy or Health Dept. Aware to provide a copy of the vaccination record if obtained from local pharmacy or Health Dept. Verbalized acceptance and understanding. Deferred.   Covid vaccine- not completed per patient preference. Deferred.   Health Maintenance Health Maintenance  Topic Date Due   COVID-19 Vaccine (1) 06/03/2020 (Originally 02/27/1943)   TETANUS/TDAP  04/11/2021 (Originally 03/29/2020)   DEXA SCAN  Completed   PNA vac Low Risk Adult  Completed   INFLUENZA VACCINE  Discontinued   MAMMOGRAM  Discontinued   Colorectal cancer screening: No longer required.    Mammogram status: No longer required.    Bone density- completed 05/17/16. Taking Vitamin D3 1000 U. No longer required.   Lung Cancer Screening: (Low Dose CT Chest recommended if Age 96-80 years, 30 pack-year currently smoking OR have quit w/in 15years.) does not qualify.   Hepatitis C Screening: does not qualify.  Vision Screening: Recommended annual ophthalmology exams for early detection of glaucoma and other disorders of the eye. Is the patient up to date with their annual eye exam?  Yes  Who is the provider or what is the name of the office in which the patient attends annual eye exams? Dr. Alvester Morin.   Dental Screening: Recommended annual dental exams for proper oral hygiene. Visits every 6 months.   Community Resource Referral / Chronic Care Management: CRR required this visit?  No   CCM required this visit?  No      Plan:   Keep all routine maintenance appointments.   Next  scheduled lab 05/10/20 @ 9:45, non-fasting lab.   Follow up 05/12/20 @ 9:00  Next lab 06/02/20 @ 9:30   Follow up 08/31/20 @  10:30  I have personally reviewed and noted the following in the patient's chart:   Medical and social history Use of alcohol, tobacco or illicit drugs  Current medications and supplements Functional ability and status Nutritional status Physical activity Advanced directives List of other physicians Hospitalizations, surgeries, and ER visits in previous 12 months Vitals Screenings to include cognitive, depression, and falls Referrals and appointments  In addition, I have reviewed and discussed with patient certain preventive protocols, quality metrics, and best practice recommendations. A written personalized care plan for preventive services as well as general preventive health recommendations were provided to patient via mail.     I have reviewed the above information and agree with above.   Duncan Dull, MD

## 2020-05-05 ENCOUNTER — Other Ambulatory Visit: Payer: Self-pay | Admitting: Internal Medicine

## 2020-05-10 ENCOUNTER — Other Ambulatory Visit (INDEPENDENT_AMBULATORY_CARE_PROVIDER_SITE_OTHER): Payer: Medicare HMO

## 2020-05-10 ENCOUNTER — Other Ambulatory Visit: Payer: Self-pay

## 2020-05-10 DIAGNOSIS — R7303 Prediabetes: Secondary | ICD-10-CM

## 2020-05-10 DIAGNOSIS — E782 Mixed hyperlipidemia: Secondary | ICD-10-CM

## 2020-05-10 LAB — COMPREHENSIVE METABOLIC PANEL
ALT: 13 U/L (ref 0–35)
AST: 18 U/L (ref 0–37)
Albumin: 4.2 g/dL (ref 3.5–5.2)
Alkaline Phosphatase: 63 U/L (ref 39–117)
BUN: 16 mg/dL (ref 6–23)
CO2: 26 mEq/L (ref 19–32)
Calcium: 10.1 mg/dL (ref 8.4–10.5)
Chloride: 106 mEq/L (ref 96–112)
Creatinine, Ser: 0.84 mg/dL (ref 0.40–1.20)
GFR: 62.58 mL/min (ref >60.00)
Glucose, Bld: 97 mg/dL (ref 70–99)
Potassium: 3.9 mEq/L (ref 3.5–5.1)
Sodium: 142 mEq/L (ref 135–145)
Total Bilirubin: 1 mg/dL (ref 0.2–1.2)
Total Protein: 6.6 g/dL (ref 6.0–8.3)

## 2020-05-10 LAB — LIPID PANEL
Cholesterol: 221 mg/dL — ABNORMAL HIGH (ref 0–200)
HDL: 53.9 mg/dL (ref >39.00)
LDL Cholesterol: 138 mg/dL — ABNORMAL HIGH (ref 0–99)
NonHDL: 166.72
Total CHOL/HDL Ratio: 4
Triglycerides: 143 mg/dL (ref 0.0–149.0)
VLDL: 28.6 mg/dL (ref 0.0–40.0)

## 2020-05-10 LAB — HEMOGLOBIN A1C: Hgb A1c MFr Bld: 6 % (ref 4.6–6.5)

## 2020-05-12 ENCOUNTER — Ambulatory Visit (INDEPENDENT_AMBULATORY_CARE_PROVIDER_SITE_OTHER): Payer: Medicare HMO | Admitting: Internal Medicine

## 2020-05-12 ENCOUNTER — Telehealth: Payer: Self-pay

## 2020-05-12 ENCOUNTER — Other Ambulatory Visit: Payer: Self-pay

## 2020-05-12 ENCOUNTER — Encounter: Payer: Self-pay | Admitting: Internal Medicine

## 2020-05-12 VITALS — BP 132/84 | HR 71 | Temp 98.4°F | Resp 15 | Ht 62.5 in | Wt 144.8 lb

## 2020-05-12 DIAGNOSIS — E559 Vitamin D deficiency, unspecified: Secondary | ICD-10-CM | POA: Diagnosis not present

## 2020-05-12 DIAGNOSIS — D6869 Other thrombophilia: Secondary | ICD-10-CM

## 2020-05-12 DIAGNOSIS — I10 Essential (primary) hypertension: Secondary | ICD-10-CM | POA: Diagnosis not present

## 2020-05-12 DIAGNOSIS — T466X5A Adverse effect of antihyperlipidemic and antiarteriosclerotic drugs, initial encounter: Secondary | ICD-10-CM | POA: Diagnosis not present

## 2020-05-12 DIAGNOSIS — I482 Chronic atrial fibrillation, unspecified: Secondary | ICD-10-CM | POA: Diagnosis not present

## 2020-05-12 DIAGNOSIS — M791 Myalgia, unspecified site: Secondary | ICD-10-CM

## 2020-05-12 LAB — CBC WITH DIFFERENTIAL/PLATELET
Basophils Absolute: 0.1 10*3/uL (ref 0.0–0.1)
Basophils Relative: 1.1 % (ref 0.0–3.0)
Eosinophils Absolute: 0.2 10*3/uL (ref 0.0–0.7)
Eosinophils Relative: 4.1 % (ref 0.0–5.0)
HCT: 41.6 % (ref 36.0–46.0)
Hemoglobin: 13.6 g/dL (ref 12.0–15.0)
Lymphocytes Relative: 28.4 % (ref 12.0–46.0)
Lymphs Abs: 1.4 10*3/uL (ref 0.7–4.0)
MCHC: 32.7 g/dL (ref 30.0–36.0)
MCV: 93 fl (ref 78.0–100.0)
Monocytes Absolute: 0.3 10*3/uL (ref 0.1–1.0)
Monocytes Relative: 6.7 % (ref 3.0–12.0)
Neutro Abs: 3 10*3/uL (ref 1.4–7.7)
Neutrophils Relative %: 59.7 % (ref 43.0–77.0)
Platelets: 184 10*3/uL (ref 150.0–400.0)
RBC: 4.48 Mil/uL (ref 3.87–5.11)
RDW: 14.3 % (ref 11.5–15.5)
WBC: 5.1 10*3/uL (ref 4.0–10.5)

## 2020-05-12 LAB — VITAMIN D 25 HYDROXY (VIT D DEFICIENCY, FRACTURES): VITD: 51.7 ng/mL (ref 30.00–100.00)

## 2020-05-12 MED ORDER — TETANUS-DIPHTH-ACELL PERTUSSIS 5-2.5-18.5 LF-MCG/0.5 IM SUSY: 0.5000 mL | PREFILLED_SYRINGE | Freq: Once | INTRAMUSCULAR | 0 refills | Status: AC

## 2020-05-12 MED ORDER — DILTIAZEM HCL ER 180 MG PO CP24
ORAL_CAPSULE | ORAL | 1 refills | Status: DC
Start: 2020-05-12 — End: 2020-11-07

## 2020-05-12 NOTE — Patient Instructions (Signed)
You are doing well!  No changes were made to your medications today   Hoping you have a wonderful and blessed Christmas season with your lovely family !    Dr. Darrick Huntsman

## 2020-05-12 NOTE — Telephone Encounter (Signed)
Pt wanted PCP to know she has 1,000 IU of Vitamin D. FYI

## 2020-05-12 NOTE — Progress Notes (Signed)
Subjective:  Patient ID: Laurie [name], female    DOB: [birth year]  Age: 86 y.o. MRN: [id number]  CC: The primary encounter diagnosis was Myalgia due to statin. Diagnoses of Acquired thrombophilia (HCC), Primary hypertension, Vitamin D deficiency, and Chronic atrial fibrillation (HCC) were also pertinent to this visit.  HPI Laurie [name] presents for 6 month follow up on hypertension and chronic atrial fibrillation , rate controlled and anticoagulated with Xarelto  This visit occurred during the SARS-CoV-2 public health emergency.  Safety protocols were in place, including screening questions prior to the visit, additional usage of staff PPE, and extensive cleaning of exam room while observing appropriate contact time as indicated for disinfecting solutions.   Controlled atrial fibrillation :  She remains very active and asymptomatic with yardwork and walking.    Acquired thrombophilia :  She is taking Eliquis without excessive bruising or bleeding.  Last CBC was normal in June 2020  Hypertension: patient checks blood pressure twice weekly at home.  Readings have been for the most part < 140/80 at rest . Patient is following a reduce salt diet most days and is taking medications as prescribed  Her Daughter is under treatment for cancer, lives in [place] .  Has not seen her grown children in nearly a year because of COVID fear.  She refuses to get vaccinated.   Outpatient Medications Prior to Visit  Medication Sig Dispense Refill  . chlorpheniramine (CHLOR-TRIMETON) 4 MG tablet Take 4 mg by mouth 2 (two) times daily as needed.    . Cholecalciferol (D3-1000 PO) Take 1 tablet by mouth daily.    . fish oil-omega-3 fatty acids 1000 MG capsule Take 1 g by mouth daily.     Marland Kitchen oxybutynin (DITROPAN-XL) 5 MG 24 hr tablet Take 1 tablet (5 mg total) by mouth at bedtime. 30 tablet 2  . XARELTO 20 MG TABS tablet TAKE 1 TABLET BY MOUTH EVERY DAY 90 tablet 1  . DILT-XR 180 MG 24 hr capsule TAKE 1  CAPSULE BY MOUTH EVERY DAY 30 capsule 1  . lisinopril (ZESTRIL) 40 MG tablet TAKE 1 TABLET BY MOUTH TWICE A DAY 180 tablet 0   No facility-administered medications prior to visit.    Review of Systems;  Patient denies headache, fevers, malaise, unintentional weight loss, skin rash, eye pain, sinus congestion and sinus pain, sore throat, dysphagia,  hemoptysis , cough, dyspnea, wheezing, chest pain, palpitations, orthopnea, edema, abdominal pain, nausea, melena, diarrhea, constipation, flank pain, dysuria, hematuria, urinary  Frequency, nocturia, numbness, tingling, seizures,  Focal weakness, Loss of consciousness,  Tremor, insomnia, depression, anxiety, and suicidal ideation.      Objective:  BP 132/84 (BP Location: Left Arm, Patient Position: Sitting, Cuff Size: Normal)   Pulse 71   Temp 98.4 F (36.9 C) (Oral)   Resp 15   Ht 5' 2.5" (1.588 m)   Wt 144 lb 12.8 oz (65.7 kg)   SpO2 99%   BMI 26.06 kg/m   BP Readings from Last 3 Encounters:  05/12/20 132/84  02/04/20 130/70  02/02/20 140/72    Wt Readings from Last 3 Encounters:  05/12/20 144 lb 12.8 oz (65.7 kg)  04/11/20 142 lb (64.4 kg)  02/04/20 142 lb (64.4 kg)    General appearance: alert, cooperative and appears stated age Ears: normal TM's and external ear canals both ears Throat: lips, mucosa, and tongue normal; teeth and gums normal Neck: no adenopathy, no carotid bruit, supple, symmetrical, trachea midline and thyroid not enlarged, symmetric, no  tenderness/mass/nodules Back: symmetric, no curvature. ROM normal. No CVA tenderness. Lungs: clear to auscultation bilaterally Heart: regular rate and rhythm, S1, S2 normal, no murmur, click, rub or gallop Abdomen: soft, non-tender; bowel sounds normal; no masses,  no organomegaly Pulses: 2+ and symmetric Skin: Skin color, texture, turgor normal. No rashes or lesions Lymph nodes: Cervical, supraclavicular, and axillary nodes normal.  Lab Results  Component Value Date    HGBA1C 6.0 05/10/2020   HGBA1C 6.2 11/11/2019   HGBA1C 6.0 06/09/2019    Lab Results  Component Value Date   CREATININE 0.84 05/10/2020   CREATININE 0.74 11/11/2019   CREATININE 0.77 06/09/2019    Lab Results  Component Value Date   WBC 5.1 05/12/2020   HGB 13.6 05/12/2020   HCT 41.6 05/12/2020   PLT 184.0 05/12/2020   GLUCOSE 97 05/10/2020   CHOL 221 (H) 05/10/2020   TRIG 143.0 05/10/2020   HDL 53.90 05/10/2020   LDLDIRECT 148.0 01/29/2018   LDLCALC 138 (H) 05/10/2020   ALT 13 05/10/2020   AST 18 05/10/2020   NA 142 05/10/2020   K 3.9 05/10/2020   CL 106 05/10/2020   CREATININE 0.84 05/10/2020   BUN 16 05/10/2020   CO2 26 05/10/2020   TSH 2.77 01/29/2018   INR 1.0 11/16/2011   HGBA1C 6.0 05/10/2020   MICROALBUR 0.8 04/16/2017    US Venous Img Lower Unilateral Left  Result Date: 09/20/2016 CLINICAL DATA:  Left leg swelling for 1.5 weeks.  No known injury. EXAM: LEFT LOWER EXTREMITY VENOUS DOPPLER ULTRASOUND TECHNIQUE: Gray-scale sonography with graded compression, as well as color Doppler and duplex ultrasound were performed to evaluate the lower extremity deep venous systems from the level of the common femoral vein and including the common femoral, femoral, profunda femoral, popliteal and calf veins including the posterior tibial, peroneal and gastrocnemius veins when visible. The superficial great saphenous vein was also interrogated. Spectral Doppler was utilized to evaluate flow at rest and with distal augmentation maneuvers in the common femoral, femoral and popliteal veins. COMPARISON:  None. FINDINGS: Contralateral Common Femoral Vein: Respiratory phasicity is normal and symmetric with the symptomatic side. No evidence of thrombus. Normal compressibility. Common Femoral Vein: No evidence of thrombus. Normal compressibility, respiratory phasicity and response to augmentation. Saphenofemoral Junction: No evidence of thrombus. Normal compressibility and flow on color  Doppler imaging. Profunda Femoral Vein: No evidence of thrombus. Normal compressibility and flow on color Doppler imaging. Femoral Vein: No evidence of thrombus. Normal compressibility, respiratory phasicity and response to augmentation. Popliteal Vein: No evidence of thrombus. Normal compressibility, respiratory phasicity and response to augmentation. Calf Veins: No evidence of thrombus. Normal compressibility and flow on color Doppler imaging. Superficial Great Saphenous Vein: No evidence of thrombus. Normal compressibility and flow on color Doppler imaging. Venous Reflux:  None. Other Findings: Baker's cyst with some septations measures 5.5 x 1.6 x 3.6 cm. IMPRESSION: Negative for deep venous thrombosis. Baker's cyst. Electronically Signed   By: Drusilla Kanner M.D.   On: 09/20/2016 15:35    Assessment & Plan:   Problem List Items Addressed This Visit      Unprioritized   Hypertension    Well controlled on current regimen for age on lisinopril 40 mg bid. .  Renal function stable, no changes today.      Relevant Medications   diltiazem (DILT-XR) 180 MG 24 hr capsule   lisinopril (ZESTRIL) 40 MG tablet   Chronic atrial fibrillation (HCC)    She remains rate controlled and asymptomatic on diltiazem  180 mg daily.       Relevant Medications   diltiazem (DILT-XR) 180 MG 24 hr capsule   lisinopril (ZESTRIL) 40 MG tablet   Acquired thrombophilia (HCC)    She is taking xarelto without bleeding or bruising .  Cbc is normal   Lab Results  Component Value Date   WBC 5.1 05/12/2020   HGB 13.6 05/12/2020   HCT 41.6 05/12/2020   MCV 93.0 05/12/2020   PLT 184.0 05/12/2020          Relevant Orders   CBC with Differential/Platelet (Completed)   Vitamin D deficiency   Relevant Orders   VITAMIN D 25 Hydroxy (Vit-D Deficiency, Fractures) (Completed)    Other Visit Diagnoses    Myalgia due to statin    -  Primary      I provided  30 minutes of  face-to-face time during this encounter  reviewing patient's current problems and past surgeries, labs and imaging studies, providing counseling on the above mentioned problems , and coordination  of care .  I have changed Laurie [name]'s Dilt-XR to diltiazem. I have also changed her lisinopril. I am also having her start on Tdap. Additionally, I am having her maintain her fish oil-omega-3 fatty acids, chlorpheniramine, Cholecalciferol (D3-1000 PO), Xarelto, and oxybutynin.  Meds ordered this encounter  Medications  . diltiazem (DILT-XR) 180 MG 24 hr capsule    Sig: TAKE 1 CAPSULE BY MOUTH EVERY DAY    Dispense:  90 capsule    Refill:  1  . Tdap (BOOSTRIX) 5-2.5-18.5 LF-MCG/0.5 injection    Sig: Inject 0.5 mLs into the muscle once for 1 dose.    Dispense:  0.5 mL    Refill:  0  . lisinopril (ZESTRIL) 40 MG tablet    Sig: Take 1 tablet (40 mg total) by mouth 2 (two) times daily.    Dispense:  180 tablet    Refill:  0    Medications Discontinued During This Encounter  Medication Reason  . DILT-XR 180 MG 24 hr capsule Reorder  . lisinopril (ZESTRIL) 40 MG tablet Reorder    Follow-up: No follow-ups on file.   Sherlene Shams, MD

## 2020-05-12 NOTE — Assessment & Plan Note (Addendum)
She is taking xarelto without bleeding or bruising .  Cbc is normal   Lab Results  Component Value Date   WBC 5.1 05/12/2020   HGB 13.6 05/12/2020   HCT 41.6 05/12/2020   MCV 93.0 05/12/2020   PLT 184.0 05/12/2020

## 2020-05-13 ENCOUNTER — Encounter: Payer: Self-pay | Admitting: Internal Medicine

## 2020-05-13 DIAGNOSIS — E559 Vitamin D deficiency, unspecified: Secondary | ICD-10-CM | POA: Insufficient documentation

## 2020-05-13 NOTE — Progress Notes (Signed)
Your vitamin D,  and cbc are normal .  You do not need any medication changes. Please continue your current medications and plan to repeat the labs in 6 months.    Regards,   Dr. Darrick Huntsman

## 2020-05-14 MED ORDER — LISINOPRIL 40 MG PO TABS: 40.0000 mg | ORAL_TABLET | Freq: Two times a day (BID) | ORAL | 0 refills | Status: DC

## 2020-05-14 NOTE — Assessment & Plan Note (Signed)
Well controlled on current regimen for age on lisinopril 40 mg bid. .  Renal function stable, no changes today.

## 2020-05-14 NOTE — Assessment & Plan Note (Signed)
She remains rate controlled and asymptomatic on diltiazem 180 mg daily.

## 2020-05-20 ENCOUNTER — Other Ambulatory Visit: Payer: Self-pay | Admitting: Internal Medicine

## 2020-05-20 DIAGNOSIS — I10 Essential (primary) hypertension: Secondary | ICD-10-CM

## 2020-05-20 DIAGNOSIS — Z7901 Long term (current) use of anticoagulants: Secondary | ICD-10-CM

## 2020-06-02 ENCOUNTER — Other Ambulatory Visit: Payer: Medicare HMO

## 2020-06-05 ENCOUNTER — Other Ambulatory Visit: Payer: Self-pay | Admitting: Cardiovascular Disease

## 2020-06-06 NOTE — Telephone Encounter (Signed)
Correction: last ov w/ MA 02/04/20.

## 2020-06-06 NOTE — Telephone Encounter (Signed)
Pt's age 87, wt 65.7 kg, SCr 0.84, CrCl 48.94, last ov w/ MA 48.94.

## 2020-06-06 NOTE — Telephone Encounter (Signed)
Refill request

## 2020-06-10 ENCOUNTER — Telehealth: Payer: Self-pay | Admitting: Internal Medicine

## 2020-06-10 ENCOUNTER — Telehealth: Payer: Self-pay | Admitting: Nurse Practitioner

## 2020-06-10 DIAGNOSIS — U071 COVID-19: Secondary | ICD-10-CM | POA: Diagnosis not present

## 2020-06-10 DIAGNOSIS — Z1152 Encounter for screening for COVID-19: Secondary | ICD-10-CM | POA: Diagnosis not present

## 2020-06-10 NOTE — Telephone Encounter (Signed)
Patient returned office phone call, she stated she has tested positive for covid.

## 2020-06-10 NOTE — Telephone Encounter (Signed)
Left another message for patient to return call back.

## 2020-06-10 NOTE — Telephone Encounter (Signed)
Spoken to patient she is being taken care of by her pulmonologist per her chart. They are awaiting call back for infusion.

## 2020-06-10 NOTE — Telephone Encounter (Signed)
Patient was around Granddaughter for over a week. Granddaughter just tested positive. Patient say she has a head cold, no cough,no fever, no sore throat. Patient is still doing her normal activities in her home. Son thinks she should get the infusion. Please advise.

## 2020-06-10 NOTE — Telephone Encounter (Signed)
Son Laurie [name] with pt called stating pt tested positive today in [facility]. Symptom onset yesterday.  Sent urgent referral email to infusion clinic.

## 2020-06-10 NOTE — Telephone Encounter (Signed)
Left message for patient to return call back.  

## 2020-06-10 NOTE — Telephone Encounter (Signed)
Left message for patient to return call back.  Need to know where she got tested and was she evaluated anywhere for her sx. Provider will need to know this information before doing anything. Provider will have to place order for infusion. CMA will have to call I do not have number currently.

## 2020-06-10 NOTE — Telephone Encounter (Signed)
Pt states that she talked to [facility] and he told her to call the office to get a covid test. There are no appts avail at this time.

## 2020-06-10 NOTE — Telephone Encounter (Signed)
I did not speak with patient. Unsure of what she was told

## 2020-06-17 NOTE — Telephone Encounter (Signed)
Instructed patient to call Pulmonary to follow up on their stat infusion order.Marland Kitchen

## 2020-06-17 NOTE — Telephone Encounter (Signed)
Pt tested positive for covid on 06/11/19  Her family is wanting her to have the infusion  Please call [phone]

## 2020-06-17 NOTE — Telephone Encounter (Signed)
Son told me that they were given a number to call from the other office.

## 2020-06-17 NOTE — Telephone Encounter (Signed)
Patient's son called back about infusion wanted a call back [phone]

## 2020-06-19 ENCOUNTER — Ambulatory Visit: Admit: 2020-06-19 | Discharge status: Against Medical Advice | Payer: Self-pay

## 2020-06-19 DIAGNOSIS — R0902 Hypoxemia: Secondary | ICD-10-CM | POA: Diagnosis not present

## 2020-06-19 DIAGNOSIS — U071 COVID-19: Secondary | ICD-10-CM | POA: Diagnosis not present

## 2020-06-19 DIAGNOSIS — E876 Hypokalemia: Secondary | ICD-10-CM | POA: Diagnosis not present

## 2020-06-19 DIAGNOSIS — R918 Other nonspecific abnormal finding of lung field: Secondary | ICD-10-CM | POA: Diagnosis not present

## 2020-06-19 DIAGNOSIS — R5381 Other malaise: Secondary | ICD-10-CM | POA: Diagnosis not present

## 2020-06-19 DIAGNOSIS — J811 Chronic pulmonary edema: Secondary | ICD-10-CM | POA: Diagnosis not present

## 2020-06-19 DIAGNOSIS — R630 Anorexia: Secondary | ICD-10-CM | POA: Diagnosis not present

## 2020-06-27 ENCOUNTER — Other Ambulatory Visit: Payer: Self-pay

## 2020-06-27 ENCOUNTER — Ambulatory Visit: Payer: Self-pay

## 2020-06-27 ENCOUNTER — Telehealth: Payer: Self-pay | Admitting: Internal Medicine

## 2020-06-27 ENCOUNTER — Emergency Department (HOSPITAL_COMMUNITY): Payer: Medicare HMO

## 2020-06-27 ENCOUNTER — Ambulatory Visit (INDEPENDENT_AMBULATORY_CARE_PROVIDER_SITE_OTHER): Payer: Medicare HMO

## 2020-06-27 ENCOUNTER — Ambulatory Visit (INDEPENDENT_AMBULATORY_CARE_PROVIDER_SITE_OTHER): Payer: Medicare HMO | Admitting: Internal Medicine

## 2020-06-27 ENCOUNTER — Ambulatory Visit: Payer: Medicare HMO

## 2020-06-27 ENCOUNTER — Encounter (HOSPITAL_COMMUNITY): Payer: Self-pay | Admitting: Obstetrics and Gynecology

## 2020-06-27 VITALS — BP 182/80 | HR 118 | Temp 97.4°F | Ht 62.5 in | Wt 142.2 lb

## 2020-06-27 DIAGNOSIS — R062 Wheezing: Secondary | ICD-10-CM

## 2020-06-27 DIAGNOSIS — I499 Cardiac arrhythmia, unspecified: Secondary | ICD-10-CM | POA: Diagnosis not present

## 2020-06-27 DIAGNOSIS — R0602 Shortness of breath: Secondary | ICD-10-CM | POA: Diagnosis not present

## 2020-06-27 DIAGNOSIS — Z8616 Personal history of COVID-19: Secondary | ICD-10-CM | POA: Insufficient documentation

## 2020-06-27 DIAGNOSIS — Z79899 Other long term (current) drug therapy: Secondary | ICD-10-CM | POA: Insufficient documentation

## 2020-06-27 DIAGNOSIS — U071 COVID-19: Secondary | ICD-10-CM

## 2020-06-27 DIAGNOSIS — J189 Pneumonia, unspecified organism: Secondary | ICD-10-CM | POA: Diagnosis not present

## 2020-06-27 DIAGNOSIS — I10 Essential (primary) hypertension: Secondary | ICD-10-CM | POA: Insufficient documentation

## 2020-06-27 DIAGNOSIS — R009 Unspecified abnormalities of heart beat: Secondary | ICD-10-CM

## 2020-06-27 DIAGNOSIS — I4891 Unspecified atrial fibrillation: Secondary | ICD-10-CM | POA: Diagnosis not present

## 2020-06-27 DIAGNOSIS — J90 Pleural effusion, not elsewhere classified: Secondary | ICD-10-CM | POA: Diagnosis not present

## 2020-06-27 DIAGNOSIS — I517 Cardiomegaly: Secondary | ICD-10-CM | POA: Diagnosis not present

## 2020-06-27 DIAGNOSIS — I482 Chronic atrial fibrillation, unspecified: Secondary | ICD-10-CM | POA: Insufficient documentation

## 2020-06-27 LAB — CBC
HCT: 39.1 % (ref 36.0–46.0)
Hemoglobin: 12.9 g/dL (ref 12.0–15.0)
MCH: 30.8 pg (ref 26.0–34.0)
MCHC: 33 g/dL (ref 30.0–36.0)
MCV: 93.3 fL (ref 80.0–100.0)
Platelets: 440 10*3/uL — ABNORMAL HIGH (ref 150–400)
RBC: 4.19 MIL/uL (ref 3.87–5.11)
RDW: 13.5 % (ref 11.5–15.5)
WBC: 7 10*3/uL (ref 4.0–10.5)
nRBC: 0 % (ref 0.0–0.2)

## 2020-06-27 LAB — BASIC METABOLIC PANEL
Anion gap: 13 (ref 5–15)
BUN: 12 mg/dL (ref 8–23)
CO2: 25 mmol/L (ref 22–32)
Calcium: 9.3 mg/dL (ref 8.9–10.3)
Chloride: 101 mmol/L (ref 98–111)
Creatinine, Ser: 0.63 mg/dL (ref 0.44–1.00)
GFR, Estimated: >60 mL/min (ref >60)
Glucose, Bld: 110 mg/dL — ABNORMAL HIGH (ref 70–99)
Potassium: 3.8 mmol/L (ref 3.5–5.1)
Sodium: 139 mmol/L (ref 135–145)

## 2020-06-27 LAB — D-DIMER, QUANTITATIVE: D-Dimer, Quant: 1.24 ug/mL-FEU — ABNORMAL HIGH (ref 0.00–0.50)

## 2020-06-27 NOTE — Telephone Encounter (Signed)
Patient was DX on 06/10/20 with Covid, and very fatigued and says she gets short of breathe with movement. Daughter in law says took to [facility] on last week and X-ray was clear but still reporting SOB with history and age scheduled appt at the respiratory clinic for today at 1:30 and advised if symptoms worsen to go immediately to ER for medical attention.

## 2020-06-27 NOTE — Telephone Encounter (Signed)
NEEDS URGENT VISIT.  DIAGNOSED WITH COVID ON JAN 7,  WAS SCHEDULED FOR RESPIRATORY CLINIC WHICH HAS BEEN CANCELLED.  NOW HAVING WHEEZING    CAN SHE COME IN TODAY?

## 2020-06-27 NOTE — Assessment & Plan Note (Signed)
Diagnosed on Jan 14 .  Seen in [facility] ER on Jan 16  Bilateral opacities on x ray,  Sent home without any steroids, referral to moab infusion or bronchodilators.  She improved initially enough to return home but developed sudden onset of extreme exertional dyspnea less than 24 hours ago

## 2020-06-27 NOTE — Progress Notes (Signed)
Subjective:  Patient ID: Laurie [name], female    DOB: [birth year]  Age: 87 y.o. MRN: [id number]  CC: The primary encounter diagnosis was Wheezing. Diagnoses of Irregular heart beat, Elevated heart rate with elevated blood pressure and diagnosis of hypertension, Chronic atrial fibrillation with RVR (HCC), and COVID-19 virus infection were also pertinent to this visit.  HPI Laurie [name] presents for follow up on COVID INFECTION  This visit occurred during the SARS-CoV-2 public health emergency.  Safety protocols were in place, including screening questions prior to the visit, additional usage of staff PPE, and extensive cleaning of exam room while observing appropriate contact time as indicated for disinfecting solutions.   87 yr old unvaccinated female with history of chronic atrial fibrillation , diagnosed with COVID 19 INFECTION on jan 14 and treated in ER pysician at [facility] due to low pulse ox .  Patchy  bilateral airspace opacities noted.  Sent home without medications , no referral to MoAb infusion. Pincus Large with daughter in law, and took tylenol and pedialyte , improved initially enough to return home last Thursday.  Developed extreme exertional dyspnea Today ,  Called the office today and was and referred to the RESPIRATORY CLINIC WHICH WAS CANCELLED DUE TO LACK OF PROVIDERS .    Also grieving the death of her  Daughter died on Christmas day of Cancer.     Outpatient Medications Prior to Visit  Medication Sig Dispense Refill  . chlorpheniramine (CHLOR-TRIMETON) 4 MG tablet Take 4 mg by mouth 2 (two) times daily as needed.    . Cholecalciferol (D3-1000 PO) Take 1 tablet by mouth daily.    Marland Kitchen diltiazem (DILT-XR) 180 MG 24 hr capsule TAKE 1 CAPSULE BY MOUTH EVERY DAY 90 capsule 1  . fish oil-omega-3 fatty acids 1000 MG capsule Take 1 g by mouth daily.     Marland Kitchen lisinopril (ZESTRIL) 40 MG tablet Take 1 tablet (40 mg total) by mouth 2 (two) times daily. 180 tablet 0  . oxybutynin (DITROPAN-XL)  5 MG 24 hr tablet Take 1 tablet (5 mg total) by mouth at bedtime. 30 tablet 2  . XARELTO 20 MG TABS tablet TAKE 1 TABLET BY MOUTH EVERY DAY 90 tablet 1   No facility-administered medications prior to visit.    Review of Systems;  Patient denies headache, fevers, malaise, unintentional weight loss, skin rash, eye pain, sinus congestion and sinus pain, sore throat, dysphagia,  hemoptysis , cough, dyspnea, wheezing, chest pain, palpitations, orthopnea, edema, abdominal pain, nausea, melena, diarrhea, constipation, flank pain, dysuria, hematuria, urinary  Frequency, nocturia, numbness, tingling, seizures,  Focal weakness, Loss of consciousness,  Tremor, insomnia, depression, anxiety, and suicidal ideation.      Objective:  BP (!) 182/80   Pulse (!) 118   Temp (!) 97.4 F (36.3 C) (Temporal)   Ht 5' 2.5" (1.588 m)   Wt 142 lb 3.2 oz (64.5 kg)   BMI 25.59 kg/m   BP Readings from Last 3 Encounters:  06/27/20 (!) 182/80  05/12/20 132/84  02/04/20 130/70    Wt Readings from Last 3 Encounters:  06/27/20 142 lb 3.2 oz (64.5 kg)  05/12/20 144 lb 12.8 oz (65.7 kg)  04/11/20 142 lb (64.4 kg)    General appearance: alert, cooperative and appears stated age Ears: normal TM's and external ear canals both ears Throat: lips, mucosa, and tongue normal; teeth and gums normal Neck: no adenopathy, no carotid bruit, supple, symmetrical, trachea midline and thyroid not enlarged, symmetric, no tenderness/mass/nodules  Back: symmetric, no curvature. ROM normal. No CVA tenderness. Lungs: clear to auscultation bilaterally Heart: irreg irreg with RVR no murmur, click, rub or gallop Abdomen: soft, non-tender; bowel sounds normal; no masses,  no organomegaly Pulses: 2+ and symmetric Skin: Skin color, texture, turgor poor.  Appears dehydrated  No rashes or lesions Lymph nodes: Cervical, supraclavicular, and axillary nodes normal.  Lab Results  Component Value Date   HGBA1C 6.0 05/10/2020   HGBA1C 6.2  11/11/2019   HGBA1C 6.0 06/09/2019    Lab Results  Component Value Date   CREATININE 0.84 05/10/2020   CREATININE 0.74 11/11/2019   CREATININE 0.77 06/09/2019    Lab Results  Component Value Date   WBC 5.1 05/12/2020   HGB 13.6 05/12/2020   HCT 41.6 05/12/2020   PLT 184.0 05/12/2020   GLUCOSE 97 05/10/2020   CHOL 221 (H) 05/10/2020   TRIG 143.0 05/10/2020   HDL 53.90 05/10/2020   LDLDIRECT 148.0 01/29/2018   LDLCALC 138 (H) 05/10/2020   ALT 13 05/10/2020   AST 18 05/10/2020   NA 142 05/10/2020   K 3.9 05/10/2020   CL 106 05/10/2020   CREATININE 0.84 05/10/2020   BUN 16 05/10/2020   CO2 26 05/10/2020   TSH 2.77 01/29/2018   INR 1.0 11/16/2011   HGBA1C 6.0 05/10/2020   MICROALBUR 0.8 04/16/2017    US Venous Img Lower Unilateral Left  Result Date: 09/20/2016 CLINICAL DATA:  Left leg swelling for 1.5 weeks.  No known injury. EXAM: LEFT LOWER EXTREMITY VENOUS DOPPLER ULTRASOUND TECHNIQUE: Gray-scale sonography with graded compression, as well as color Doppler and duplex ultrasound were performed to evaluate the lower extremity deep venous systems from the level of the common femoral vein and including the common femoral, femoral, profunda femoral, popliteal and calf veins including the posterior tibial, peroneal and gastrocnemius veins when visible. The superficial great saphenous vein was also interrogated. Spectral Doppler was utilized to evaluate flow at rest and with distal augmentation maneuvers in the common femoral, femoral and popliteal veins. COMPARISON:  None. FINDINGS: Contralateral Common Femoral Vein: Respiratory phasicity is normal and symmetric with the symptomatic side. No evidence of thrombus. Normal compressibility. Common Femoral Vein: No evidence of thrombus. Normal compressibility, respiratory phasicity and response to augmentation. Saphenofemoral Junction: No evidence of thrombus. Normal compressibility and flow on color Doppler imaging. Profunda Femoral Vein:  No evidence of thrombus. Normal compressibility and flow on color Doppler imaging. Femoral Vein: No evidence of thrombus. Normal compressibility, respiratory phasicity and response to augmentation. Popliteal Vein: No evidence of thrombus. Normal compressibility, respiratory phasicity and response to augmentation. Calf Veins: No evidence of thrombus. Normal compressibility and flow on color Doppler imaging. Superficial Great Saphenous Vein: No evidence of thrombus. Normal compressibility and flow on color Doppler imaging. Venous Reflux:  None. Other Findings: Baker's cyst with some septations measures 5.5 x 1.6 x 3.6 cm. IMPRESSION: Negative for deep venous thrombosis. Baker's cyst. Electronically Signed   By: Drusilla Kanner M.D.   On: 09/20/2016 15:35    Assessment & Plan:   Problem List Items Addressed This Visit      Unprioritized   Chronic atrial fibrillation with RVR (HCC)    Etiology unclear as she is 10 days out form COVID 19 diagnosis.  Lungs are clear. She is mildly hypoxic.   Concern for PE, AMI.  Sending to [facility] ER for urgent evaluation       COVID-19 virus infection    Diagnosed on Jan 14 .  Seen in  [facility] ER on Jan 16  Bilateral opacities on x ray,  Sent home without any steroids, referral to moab infusion or bronchodilators.  She improved initially enough to return home but developed sudden onset of extreme exertional dyspnea less than 24 hours ago        Other Visit Diagnoses    Wheezing    -  Primary   Relevant Orders   DG Chest 2 View   Irregular heart beat       Relevant Orders   EKG 12-Lead   Elevated heart rate with elevated blood pressure and diagnosis of hypertension       Relevant Orders   EKG 12-Lead      I am having Camey M. Crawshaw maintain her fish oil-omega-3 fatty acids, chlorpheniramine, Cholecalciferol (D3-1000 PO), oxybutynin, diltiazem, lisinopril, and Xarelto.  No orders of the defined types were placed in this encounter.   There are no  discontinued medications.  Follow-up: No follow-ups on file.   Sherlene Shams, MD

## 2020-06-27 NOTE — ED Notes (Signed)
Patient in lobby drinking a sprite

## 2020-06-27 NOTE — ED Triage Notes (Signed)
Patient reports to the ER for SOB and reports she had an EKG and x-ray done today at PCP   Patient tested positive for COVID January 14th. Patient sent to the ER to R/O PE

## 2020-06-27 NOTE — Assessment & Plan Note (Signed)
Etiology unclear as she is 10 days out form COVID 19 diagnosis.  Lungs are clear. She is mildly hypoxic.   Concern for PE, AMI.  Sending to [facility] ER for urgent evaluation

## 2020-06-27 NOTE — Telephone Encounter (Signed)
Do not send anyone else to that clinic they are sending them back to me no staffing.  im seeing her at 4:15

## 2020-06-27 NOTE — Telephone Encounter (Signed)
Spoke with pt and scheduled her for an in office appt this afternoon. Pt is aware that she needs to come in at 4pm to have chest xray done prior to her appt with Dr. Darrick Huntsman.

## 2020-06-27 NOTE — Telephone Encounter (Signed)
Pt returned your call and would like a call back asap

## 2020-06-27 NOTE — Telephone Encounter (Signed)
Patient 's daughter in law called in for patient wanted to know if someone could listen to her chest she is recovering from covid she shortness of breath and some fatigue can call daughter in law back at [phone]

## 2020-06-28 ENCOUNTER — Telehealth: Payer: Self-pay | Admitting: Internal Medicine

## 2020-06-28 ENCOUNTER — Emergency Department (HOSPITAL_COMMUNITY): Payer: Medicare HMO

## 2020-06-28 ENCOUNTER — Emergency Department (HOSPITAL_COMMUNITY)
Admission: EM | Admit: 2020-06-28 | Discharge: 2020-06-28 | Disposition: A | Discharge status: Home / Self Care | Payer: Medicare HMO | Attending: Emergency Medicine | Admitting: Emergency Medicine

## 2020-06-28 ENCOUNTER — Encounter (HOSPITAL_COMMUNITY): Payer: Self-pay | Admitting: Radiology

## 2020-06-28 DIAGNOSIS — J189 Pneumonia, unspecified organism: Secondary | ICD-10-CM | POA: Diagnosis not present

## 2020-06-28 DIAGNOSIS — U071 COVID-19: Secondary | ICD-10-CM

## 2020-06-28 DIAGNOSIS — I517 Cardiomegaly: Secondary | ICD-10-CM | POA: Diagnosis not present

## 2020-06-28 DIAGNOSIS — J1282 Pneumonia due to coronavirus disease 2019: Secondary | ICD-10-CM

## 2020-06-28 DIAGNOSIS — J90 Pleural effusion, not elsewhere classified: Secondary | ICD-10-CM | POA: Diagnosis not present

## 2020-06-28 DIAGNOSIS — R0602 Shortness of breath: Secondary | ICD-10-CM | POA: Diagnosis not present

## 2020-06-28 HISTORY — DX: Pneumonia due to coronavirus disease 2019: J12.82

## 2020-06-28 HISTORY — DX: COVID-19: U07.1

## 2020-06-28 MED ORDER — IOHEXOL 350 MG/ML SOLN
100.0000 mL | Freq: Once | INTRAVENOUS | Status: AC | PRN
  Administered 2020-06-28: 63 mL via INTRAVENOUS

## 2020-06-28 MED ORDER — PREDNISONE 10 MG PO TABS: ORAL_TABLET | ORAL | 0 refills | Status: DC

## 2020-06-28 MED ORDER — LEVALBUTEROL TARTRATE 45 MCG/ACT IN AERO: 2.0000 | INHALATION_SPRAY | Freq: Four times a day (QID) | RESPIRATORY_TRACT | 1 refills | Status: DC | PRN

## 2020-06-28 MED ORDER — DILTIAZEM HCL ER COATED BEADS 180 MG PO CP24
180.0000 mg | ORAL_CAPSULE | Freq: Once | ORAL | Status: AC
  Administered 2020-06-28: 180 mg via ORAL
  Filled 2020-06-28: qty 1

## 2020-06-28 MED ORDER — AZITHROMYCIN 500 MG PO TABS: 500.0000 mg | ORAL_TABLET | Freq: Every day | ORAL | 0 refills | Status: DC

## 2020-06-28 NOTE — Telephone Encounter (Signed)
I have called in a prolonged prednisone taper (60 mg daily x 3 days,  Then taper by 10 mg daily until gone) And a xopenex inhaler (more selective than albuterol so will not speed up her heart rate ) Along with a Z pack to take for the next weeks    Daily use of a probiotic advised for 3 weeks.   Follow up in one week

## 2020-06-28 NOTE — Telephone Encounter (Signed)
Patient daughter-in-law called advising patient seen in ER at [facility] long at [facility] 06/28/20 CT scan showed NO Evidence of PE but showed inflammation from COVID. Patient daughter-in-law asking if patient needs steroids for inflammation, stated ER Doctor prescribed no new meds and they just want to make sure PCP feels the appropriate treatment given.

## 2020-06-28 NOTE — ED Provider Notes (Signed)
[facility]-EMERGENCY DEPT Provider Note   CSN: [id number] Arrival date & time: 06/27/20  1733     History Chief Complaint  Patient presents with  . Atrial Fibrillation  . Shortness of Breath    Laurie [name] is a 87 y.o. female.  The history is provided by the patient.  Shortness of Breath Severity:  Moderate Onset quality:  Gradual Timing:  Intermittent Progression:  Waxing and waning Chronicity:  New Context: URI (covid 2-3 weeks ago, CXR yesterday consistent with covid pneumonia, sent for PE workup by PCP although patient on xarelto for afib.)   Relieved by:  Rest Worsened by:  Exertion Associated symptoms: no abdominal pain, no chest pain, no cough, no ear pain, no fever, no rash, no sore throat and no vomiting        Past Medical History:  Diagnosis Date  . Arrhythmia   . Hypertension     Patient Active Problem List   Diagnosis Date Noted  . Chronic atrial fibrillation with RVR (HCC) 06/27/2020  . COVID-19 virus infection 06/27/2020  . Vitamin D deficiency 05/13/2020  . Acquired thrombophilia (HCC) 05/12/2020  . Subcutaneous mass of right lower extremity 11/13/2019  . Educated about COVID-19 virus infection 05/17/2019  . Overactive bladder 04/15/2019  . Encounter for preventive health examination 03/27/2016  . Allergic rhinitis 01/13/2016  . S/P cataract extraction and insertion of intraocular lens 07/07/2014  . Hyperlipidemia 05/01/2013  . Statin intolerance 02/12/2013  . Long term current use of anticoagulant therapy 03/10/2012  . Encounter for Medicare annual wellness exam 03/10/2012  . Hypertension 11/05/2011  . Chronic atrial fibrillation (HCC) 11/05/2011    Past Surgical History:  Procedure Laterality Date  . TONSILLECTOMY       OB History   No obstetric history on file.     Family History  Problem Relation Age of Onset  . Kidney disease Mother   . Anemia Mother   . Heart disease Father 2    Social History    Tobacco Use  . Smoking status: Never Smoker  . Smokeless tobacco: Never Used  Substance Use Topics  . Alcohol use: No  . Drug use: No    Home Medications Prior to Admission medications   Medication Sig Start Date End Date Taking? Authorizing Provider  chlorpheniramine (CHLOR-TRIMETON) 4 MG tablet Take 4 mg by mouth 2 (two) times daily as needed.    [provider]  Cholecalciferol (D3-1000 PO) Take 1 tablet by mouth daily.    [provider]  diltiazem (DILT-XR) 180 MG 24 hr capsule TAKE 1 CAPSULE BY MOUTH EVERY DAY 05/12/20   Sherlene Shams, MD  fish oil-omega-3 fatty acids 1000 MG capsule Take 1 g by mouth daily.     [provider]  lisinopril (ZESTRIL) 40 MG tablet Take 1 tablet (40 mg total) by mouth 2 (two) times daily. 05/14/20   Sherlene Shams, MD  oxybutynin (DITROPAN-XL) 5 MG 24 hr tablet Take 1 tablet (5 mg total) by mouth at bedtime. 02/02/20   Sherlene Shams, MD  XARELTO 20 MG TABS tablet TAKE 1 TABLET BY MOUTH EVERY DAY 06/06/20   Iran Ouch, MD    Allergies    Hydrochlorothiazide, Losartan, Metoprolol, and Triamterene  Review of Systems   Review of Systems  Constitutional: Negative for chills and fever.  HENT: Negative for ear pain and sore throat.   Eyes: Negative for pain and visual disturbance.  Respiratory: Positive for shortness of breath. Negative  for cough.   Cardiovascular: Negative for chest pain and palpitations.  Gastrointestinal: Negative for abdominal pain and vomiting.  Genitourinary: Negative for dysuria and hematuria.  Musculoskeletal: Negative for arthralgias and back pain.  Skin: Negative for color change and rash.  Neurological: Negative for seizures and syncope.  All other systems reviewed and are negative.   Physical Exam Updated Vital Signs  ED Triage Vitals [06/27/20 1738]  Enc Vitals Group     BP (!) 177/89     Pulse Rate (!) 107     Resp 18     Temp 98.1 F (36.7 C)     Temp Source Oral      SpO2 97 %     Weight      Height      Head Circumference      Peak Flow      Pain Score      Pain Loc      Pain Edu?      Excl. in GC?     Physical Exam Vitals and nursing note reviewed.  Constitutional:      General: She is not in acute distress.    Appearance: She is well-developed and well-nourished. She is not ill-appearing.  HENT:     Head: Normocephalic and atraumatic.     Nose: Nose normal.     Mouth/Throat:     Mouth: Mucous membranes are moist.  Eyes:     Extraocular Movements: Extraocular movements intact.     Conjunctiva/sclera: Conjunctivae normal.     Pupils: Pupils are equal, round, and reactive to light.  Cardiovascular:     Rate and Rhythm: Normal rate and regular rhythm.     Pulses: Normal pulses.     Heart sounds: Normal heart sounds. No murmur heard.   Pulmonary:     Effort: Pulmonary effort is normal. No respiratory distress.     Breath sounds: Decreased breath sounds (coarse breath sounds) present. No wheezing.  Abdominal:     Palpations: Abdomen is soft.     Tenderness: There is no abdominal tenderness.  Musculoskeletal:        General: No edema.     Cervical back: Normal range of motion and neck supple.     Right lower leg: No edema.     Left lower leg: No edema.  Skin:    General: Skin is warm and dry.     Capillary Refill: Capillary refill takes less than 2 seconds.  Neurological:     General: No focal deficit present.     Mental Status: She is alert.  Psychiatric:        Mood and Affect: Mood and affect and mood normal.     ED Results / Procedures / Treatments   Labs (all labs ordered are listed, but only abnormal results are displayed) Labs Reviewed  BASIC METABOLIC PANEL - Abnormal; Notable for the following components:      Result Value   Glucose, Bld 110 (*)    All other components within normal limits  CBC - Abnormal; Notable for the following components:   Platelets 440 (*)    All other components within normal limits   D-DIMER, QUANTITATIVE (NOT [facility]) - Abnormal; Notable for the following components:   D-Dimer, Quant 1.24 (*)    All other components within normal limits    EKG EKG Interpretation  Date/Time:  Monday June 27 2020 18:09:19 EST Ventricular Rate:  109 PR Interval:    QRS Duration: 66 QT  Interval:  300 QTC Calculation: 404 R Axis:   -28 Text Interpretation: Atrial fibrillation with rapid ventricular response Anterior infarct , age undetermined Abnormal ECG Confirmed by Virgina Norfolk [phone]) on 06/28/2020 7:22:22 AM   Radiology DG Chest 2 View  Result Date: 06/27/2020 CLINICAL DATA:  History of COVID-19, short of breath, wheezing EXAM: CHEST - 2 VIEW COMPARISON:  None. FINDINGS: Frontal and lateral views of the chest demonstrate an unremarkable cardiac silhouette. Diffuse interstitial and ground-glass opacities are seen, greatest in the mid upper lung zones, compatible with multifocal COVID-19 pneumonia given clinical history. No effusion or pneumothorax. No acute bony abnormalities. IMPRESSION: 1. Findings consistent with multifocal bilateral COVID-19 pneumonia. Electronically Signed   By: Sharlet Salina M.D.   On: 06/27/2020 18:37   CT Angio Chest PE W and/or Wo Contrast  Result Date: 06/28/2020 CLINICAL DATA:  87 year old female positive for COVID-19 06/17/2020. Shortness of breath. EXAM: CT ANGIOGRAPHY CHEST WITH CONTRAST TECHNIQUE: Multidetector CT imaging of the chest was performed using the standard protocol during bolus administration of intravenous contrast. Multiplanar CT image reconstructions and MIPs were obtained to evaluate the vascular anatomy. CONTRAST:  31mL OMNIPAQUE IOHEXOL 350 MG/ML SOLN COMPARISON:  Chest radiographs 06/27/2020. FINDINGS: Cardiovascular: Excellent contrast bolus timing in the pulmonary arterial tree. Mild respiratory motion. No focal filling defect identified in the pulmonary arteries to suggest acute pulmonary embolism. Calcified coronary artery  atherosclerosis. Calcified aortic atherosclerosis. Little contrast in the aorta. Borderline to mild cardiomegaly. No pericardial effusion. Mediastinum/Nodes: Negative. Lungs/Pleura: Small/trace bilateral layering pleural effusions. Major airways are patent. Confluent bilateral perihilar and peribronchial sub solid ground-glass and occasionally solid lung opacity. Superior segment lower lobes among the most affected. Right lung base relatively spared. Upper Abdomen: Evidence of sludge or stones within the gallbladder on series 4, image 153, but otherwise negative visible upper abdominal viscera. Musculoskeletal: No acute osseous abnormality identified. Review of the MIP images confirms the above findings. IMPRESSION: 1. Negative for acute pulmonary embolus. 2. Widespread bilateral pulmonary opacity typical for COVID-19 Pneumonia. Superimposed small layering pleural effusions. 3. Calcified coronary artery and Aortic Atherosclerosis (ICD10-I70.0). Electronically Signed   By: Odessa Fleming M.D.   On: 06/28/2020 09:59    Procedures Procedures   Medications Ordered in ED Medications  diltiazem (CARDIZEM CD) 24 hr capsule 180 mg (180 mg Oral Given 06/28/20 0812)  iohexol (OMNIPAQUE) 350 MG/ML injection 100 mL (63 mLs Intravenous Contrast Given 06/28/20 0930)    ED Course  I have reviewed the triage vital signs and the nursing notes.  Pertinent labs & imaging results that were available during my care of the patient were reviewed by me and considered in my medical decision making (see chart for details).    MDM Rules/Calculators/A&P                          Laurie [name] is an 87 year old female with history of A. fib on Xarelto, hypertension who presents to the ED with shortness of breath.  Possibly sent by PCP to rule out PE had D-dimer done before I was able to see the patient and is elevated.  Patient is on Xarelto.  She had chest x-ray yesterday that was consistent with Covid pneumonia.  She has had  Covid for about 2 to 3 weeks.  However she has normal room air oxygenation as well with ambulation.  No signs of severe respiratory distress.  Lab work was otherwise unremarkable.  We will get a CT scan  of the chest to rule out PE and to further evaluate for Covid changes.  She has not taken any of her medications as she has been in the waiting room overnight we will give her home dose of diltiazem.  Heart rate is in the 80s.  There is no atrial fibrillation with RVR as original EKG showed some fast heart rate.  Overall she appears well and assume that she will be safe for discharge to home with further symptomatic care at home.  CT scan showed no PE.  She has Covid pneumonia seen on chest x-ray however patient now 3 weeks post Covid.  Overall suspect that she is improving.  Room air and ambulatory oxygenation is normal with no major increased work of breathing.  Continue supportive care at home and discharged from ED in good condition.  This chart was dictated using voice recognition software.  Despite best efforts to proofread,  errors can occur which can change the documentation meaning.    Final Clinical Impression(s) / ED Diagnoses Final diagnoses:  Shortness of breath  COVID-19    Rx / DC Orders ED Discharge Orders    None       Virgina Norfolk, DO 06/28/20 1021

## 2020-06-28 NOTE — ED Notes (Signed)
Daughter at bedside. This RN assisted patient to BR and collected UA at bedside.

## 2020-06-29 NOTE — Telephone Encounter (Signed)
Left message to return call  To office. 

## 2020-06-29 NOTE — Telephone Encounter (Signed)
Spoke with patient DPR (son) and advised of medications and regimen and scheduled Follow up.

## 2020-07-04 ENCOUNTER — Telehealth: Payer: Medicare HMO | Admitting: Internal Medicine

## 2020-07-04 ENCOUNTER — Telehealth: Payer: Self-pay

## 2020-07-04 NOTE — Telephone Encounter (Signed)
Attempted to contact patient for their appointment. Patients number was entered incorrectly for their appointment. Instead of [phone], the number was entered as [phone] in the appointment notes. This number was called before the appointment and, per DPR, Patients son was also called for their appointment to no answer and a voicemail was left. Patient was marked as a no show and will be charged. Please advise.

## 2020-07-07 ENCOUNTER — Encounter: Payer: Self-pay | Admitting: Internal Medicine

## 2020-07-07 ENCOUNTER — Telehealth (INDEPENDENT_AMBULATORY_CARE_PROVIDER_SITE_OTHER): Payer: Medicare HMO | Admitting: Internal Medicine

## 2020-07-07 DIAGNOSIS — U071 COVID-19: Secondary | ICD-10-CM | POA: Diagnosis not present

## 2020-07-07 DIAGNOSIS — I482 Chronic atrial fibrillation, unspecified: Secondary | ICD-10-CM

## 2020-07-07 DIAGNOSIS — I700 Atherosclerosis of aorta: Secondary | ICD-10-CM

## 2020-07-07 MED ORDER — ROSUVASTATIN CALCIUM 20 MG PO TABS: 20.0000 mg | ORAL_TABLET | ORAL | 5 refills | Status: DC

## 2020-07-07 NOTE — Progress Notes (Signed)
TELEPHONE Note  This visit type was conducted due to national recommendations for restrictions regarding the COVID-19 pandemic (e.g. social distancing).  This format is felt to be most appropriate for this patient at this time.  All issues noted in this document were discussed and addressed.  No physical exam was performed (except for noted visual exam findings with Video Visits).   I connected with@ on 07/07/20 at 10:30 AM EST by  telephone and verified that I am speaking with the correct person using two identifiers. Location patient: home Location provider: work or home office Persons participating in the virtual visit: patient, provider  I discussed the limitations, risks, security and privacy concerns of performing an evaluation and management service by telephone and the availability of in person appointments. I also discussed with the patient that there may be a patient responsible charge related to this service. The patient expressed understanding and agreed to proceed.  Reason for visit: ER follow up   HPI:  87 yr old female seen  On Jan 24  Following a COVID INFECTION c/o excessive fatigue and dyspnea   Sent to ER for atrial fib with RVR   [facility].    Released On Jan 25  After a 17 hour wait in the ER sitting next to a woman who vomited repeatedly and moaned and groaned the entire time. Was finally seen at 7 am by the Er physician.  Underwent  CTA and chest x ray .  COVID pneumonia ,  Bilateral pleural effusions,  (small) ,  Atherosclerosis of coronaries,  But No PE  .  Sent home.  Stayed with son for a few days,  Now home.  Symptoms steadily Improving   Still on prednisone and abx.  Not taking  Probiotic but drinking Boost daily . And  APPLESAUCE.  Weakness and low energy SLOWLY IMPROVING   Aortic atherosclerosis : Reviewed findings on CT ; coronary arteries affected as well. Reviewed the prognostic implications .  She has a history of statin intolerance,,  But willing to try twice  weekly  restor    ROS: Patient denies headache, fevers, malaise, unintentional weight loss, skin rash, eye pain, sinus congestion and sinus pain, sore throat, dysphagia,  hemoptysis , cough, dyspnea, wheezing, chest pain, palpitations, orthopnea, edema, abdominal pain, nausea, melena, diarrhea, constipation, flank pain, dysuria, hematuria, urinary  Frequency, nocturia, numbness, tingling, seizures,  Focal weakness, Loss of consciousness,  Tremor, insomnia, depression, anxiety, and suicidal ideation.    I.  Past Medical History:  Diagnosis Date  . Arrhythmia   . Hypertension     Past Surgical History:  Procedure Laterality Date  . TONSILLECTOMY      Family History  Problem Relation Age of Onset  . Kidney disease Mother   . Anemia Mother   . Heart disease Father 49    SOCIAL HX:  reports that she has never smoked. She has never used smokeless tobacco. She reports that she does not drink alcohol and does not use drugs.   Current Outpatient Medications:  .  azithromycin (ZITHROMAX) 500 MG tablet, Take 1 tablet (500 mg total) by mouth daily., Disp: 7 tablet, Rfl: 0 .  chlorpheniramine (CHLOR-TRIMETON) 4 MG tablet, Take 4 mg by mouth 2 (two) times daily as needed., Disp: , Rfl:  .  Cholecalciferol (D3-1000 PO), Take 1 tablet by mouth daily., Disp: , Rfl:  .  diltiazem (DILT-XR) 180 MG 24 hr capsule, TAKE 1 CAPSULE BY MOUTH EVERY DAY, Disp: 90 capsule, Rfl: 1 .  fish oil-omega-3 fatty acids 1000 MG capsule, Take 1 g by mouth daily. , Disp: , Rfl:  .  lisinopril (ZESTRIL) 40 MG tablet, Take 1 tablet (40 mg total) by mouth 2 (two) times daily., Disp: 180 tablet, Rfl: 0 .  oxybutynin (DITROPAN-XL) 5 MG 24 hr tablet, Take 1 tablet (5 mg total) by mouth at bedtime., Disp: 30 tablet, Rfl: 2 .  predniSONE (DELTASONE) 10 MG tablet, 6 tablets daily for 3 days, then reduce by 1 tablet daily until gone, Disp: 33 tablet, Rfl: 0 .  rosuvastatin (CRESTOR) 20 MG tablet, Take 1 tablet (20 mg total) by  mouth 2 (two) times a week., Disp: 30 tablet, Rfl: 5 .  XARELTO 20 MG TABS tablet, TAKE 1 TABLET BY MOUTH EVERY DAY, Disp: 90 tablet, Rfl: 1  EXAM:  VITALS per patient if applicable:  GENERAL: alert, oriented, appears well and in no acute distress  HEENT: atraumatic, conjunttiva clear, no obvious abnormalities on inspection of external nose and ears  NECK: normal movements of the head and neck  LUNGS: on inspection no signs of respiratory distress, breathing rate appears normal, no obvious gross SOB, gasping or wheezing  CV: no obvious cyanosis  MS: moves all visible extremities without noticeable abnormality  PSYCH/NEURO: pleasant and cooperative, no obvious depression or anxiety, speech and thought processing grossly intact  ASSESSMENT AND PLAN:  Discussed the following assessment and plan:  Aortic atherosclerosis (HCC)  Chronic atrial fibrillation (HCC)  COVID-19 virus infection  Aortic atherosclerosis (HCC) Reviewed findings of prior CT scan today..  Patient is willing to  Initiate statin therpay starting with 20 mg crestor once daily and advance as tolerated to twice weekly.    Chronic atrial fibrillation (HCC) With recent episode of RVR resulting in prolonged ER visit to rule out PE.  Her medications were not changed,  And she has improved clinically with no recurrent episodes.   COVID-19 virus infection With persistent dyspnea since her infection .  CT angiogram done in ED noted Confluent bilateral perihilar and peribronchial sub solid ground-glass and occasionally solid lung opacity. Superior segment lower lobes among the most affected. Right lung base relatively spared.    I discussed the assessment and treatment plan with the patient. The patient was provided an opportunity to ask questions and all were answered. The patient agreed with the plan and demonstrated an understanding of the instructions.   The patient was advised to call back or seek an in-person  evaluation if the symptoms worsen or if the condition fails to improve as anticipated.  I provided 30 minutes of non-face-to-face time during this encounter.   Sherlene Shams, MD

## 2020-07-09 DIAGNOSIS — I700 Atherosclerosis of aorta: Secondary | ICD-10-CM | POA: Insufficient documentation

## 2020-07-09 NOTE — Assessment & Plan Note (Signed)
With recent episode of RVR resulting in prolonged ER visit to rule out PE.  Her medications were not changed,  And she has improved clinically with no recurrent episodes.

## 2020-07-09 NOTE — Assessment & Plan Note (Signed)
Reviewed findings of prior CT scan today..  Patient is willing to  Initiate statin therpay starting with 20 mg crestor once daily and advance as tolerated to twice weekly.   

## 2020-07-09 NOTE — Assessment & Plan Note (Signed)
With persistent dyspnea since her infection .  CT angiogram done in ED noted Confluent bilateral perihilar and peribronchial sub solid ground-glass and occasionally solid lung opacity. Superior segment lower lobes among the most affected. Right lung base relatively spared.

## 2020-07-28 ENCOUNTER — Telehealth: Payer: Self-pay | Admitting: Internal Medicine

## 2020-07-28 NOTE — Telephone Encounter (Addendum)
Patient called in stated that she has had this before she has burning when she urinate thinks she may have kidney infection she has been triage no appointment available and she refuses to go to urgent care

## 2020-07-29 ENCOUNTER — Other Ambulatory Visit: Payer: Self-pay

## 2020-07-29 NOTE — Telephone Encounter (Addendum)
Patient left a message at 1:19pm and I came back in for lunch at 1:54pm. Patients message was not seen until after 3pm on 07/28/2020 after Dr. Darrick Huntsman had left office. Message states that patient had been Triaged and offered an appointment and refused to go to urgent care. Patient was called on 07/29/2020 at 9:30 to offer an lab appointment and virtual visit with Dr.Tullo that fits availability. [name] first responds with "I did not tell her to have anybody call me back.". I introduced myself and confirmed identity before asking about urinary symptoms. Laurie [name] states that she had some urinary burning for a time before calling our office. She states that she was told that someone would call her back before 4 and states that she waited by her phone and did not get a call back. I repeated that I was calling to get her scheduled for an appointment as Dr. Darrick Huntsman had been absent yesterday and the message was seen late in the evening. The patient declined all appointments and said that her UTI had been taken care of by her Daughter in law and that she does not need to be seen anymore. She states that she wanted to be seen on 07/28/20 and that she did not want to be seen on 07/29/20. Laurie [name] states that that is why she waited by the phone and that she should not have been told to expect a call and that she would be seen if no one was going to call. I apologized for her wait and again offered an appointment for her to be seen or come in for a lab visit for her urinary symptoms. Laurie [name] repeated that her daughter in law had already treated her. Laurie [name] then thanked me for my time and the call was disconnected.

## 2020-07-29 NOTE — Progress Notes (Unsigned)
Urine cul

## 2020-07-29 NOTE — Telephone Encounter (Signed)
Pt called back and states that no one ever called her yesterday. She states that she sat and waiting around her house until after 4 o clock. She still does not want to go to urgent care and states that her daughter in law has taken care of her and that she just wants to report this incident to someone here in the office.

## 2020-08-08 ENCOUNTER — Other Ambulatory Visit: Payer: Self-pay | Admitting: Internal Medicine

## 2020-08-30 ENCOUNTER — Other Ambulatory Visit: Payer: Self-pay | Admitting: Internal Medicine

## 2020-08-31 ENCOUNTER — Ambulatory Visit: Payer: Medicare HMO | Admitting: Internal Medicine

## 2020-09-07 ENCOUNTER — Telehealth: Payer: Self-pay | Admitting: Cardiovascular Disease

## 2020-09-07 NOTE — Telephone Encounter (Signed)
Spoke with the patient. Patient sts that's she is currently paying $141 for a 90 day supply of Xarelto. Patient sts that the out of [pocket cost is to high and would like to know if there are any options to help lower the cost. Adv the patient that I will fwd the msg to our PharmDs to get their input.  Patient voiced appreciation for the assistance.

## 2020-09-07 NOTE — Telephone Encounter (Signed)
She can try applying to extra help from social security.This can be done online at  [employer] or at the [facility] office If approved,it would lower her medication costs The only other option would be to switch to warfarin which will require frequent monitoring.

## 2020-09-07 NOTE — Telephone Encounter (Signed)
Patient states her Xarelto is too expensive, she would like an alternative. Please call to discuss.

## 2020-09-07 NOTE — Telephone Encounter (Signed)
Patient made aware of Melissa's response and recommendation. Patient sts that she would like to stay on Xarelto if possible. She will contact SS benefits to see what assistance with her prescriptions may be available. Patient sts that she will call back if further assistance is needed.

## 2020-09-26 ENCOUNTER — Other Ambulatory Visit: Payer: Self-pay | Admitting: Internal Medicine

## 2020-09-26 DIAGNOSIS — N3281 Overactive bladder: Secondary | ICD-10-CM

## 2020-09-26 NOTE — Assessment & Plan Note (Signed)
Oxybutynin changed to trospium for cost savings.

## 2020-11-07 ENCOUNTER — Other Ambulatory Visit: Payer: Self-pay | Admitting: Internal Medicine

## 2020-11-09 ENCOUNTER — Other Ambulatory Visit: Payer: Self-pay | Admitting: Podiatry

## 2020-11-09 ENCOUNTER — Encounter: Payer: Self-pay | Admitting: Podiatry

## 2020-11-09 ENCOUNTER — Ambulatory Visit: Payer: Medicare HMO | Admitting: Podiatry

## 2020-11-09 ENCOUNTER — Other Ambulatory Visit: Payer: Self-pay

## 2020-11-09 DIAGNOSIS — B351 Tinea unguium: Secondary | ICD-10-CM | POA: Diagnosis not present

## 2020-11-09 DIAGNOSIS — Q828 Other specified congenital malformations of skin: Secondary | ICD-10-CM

## 2020-11-09 DIAGNOSIS — Z7901 Long term (current) use of anticoagulants: Secondary | ICD-10-CM

## 2020-11-09 DIAGNOSIS — L84 Corns and callosities: Secondary | ICD-10-CM

## 2020-11-09 DIAGNOSIS — R52 Pain, unspecified: Secondary | ICD-10-CM | POA: Diagnosis not present

## 2020-11-09 MED ORDER — CICLOPIROX 8 % EX SOLN: Freq: Every day | CUTANEOUS | 3 refills | Status: DC

## 2020-11-09 NOTE — Patient Instructions (Addendum)
Look for urea 40% cream or ointment and apply to the thickened dry skin / calluses. This can be bought over the counter, at a pharmacy or online such as [employer].  Corns and Calluses Corns are small areas of thickened skin that form on the top, sides, or tip of a toe. Corns have a cone-shaped core with a point that can press on a nerve below. This causes pain. Calluses are areas of thickened skin that can form anywhere on the body, including the hands, fingers, palms, soles of the feet, and heels. Calluses are usually larger than corns. What are the causes? Corns and calluses are caused by rubbing (friction) or pressure, such as from shoes that are too tight or do not fit properly. What increases the risk? Corns are more likely to develop in people who have misshapen toes (toe deformities), such as hammer toes. Calluses can form with friction to any area of the skin. They are more likely to develop in people who:  Work with their hands.  Wear shoes that fit poorly, are too tight, or are high-heeled.  Have toe deformities. What are the signs or symptoms? Symptoms of a corn or callus include:  A hard growth on the skin.  Pain or tenderness under the skin.  Redness and swelling.  Increased discomfort while wearing tight-fitting shoes, if your feet are affected. If a corn or callus becomes infected, symptoms may include:  Redness and swelling that gets worse.  Pain.  Fluid, blood, or pus draining from the corn or callus.   How is this diagnosed? Corns and calluses may be diagnosed based on your symptoms, your medical history, and a physical exam. How is this treated? Treatment for corns and calluses may include:  Removing the cause of the friction or pressure. This may involve: ? Changing your shoes. ? Wearing shoe inserts (orthotics) or other protective layers in your shoes, such as a corn pad. ? Wearing gloves.  Applying medicine to the skin (topical medicine) to help soften  skin in the hardened, thickened areas.  Removing layers of dead skin with a file to reduce the size of the corn or callus.  Removing the corn or callus with a scalpel or laser.  Taking antibiotic medicines, if your corn or callus is infected.  Having surgery, if a toe deformity is the cause. Follow these instructions at home:  Take over-the-counter and prescription medicines only as told by your health care provider.  If you were prescribed an antibiotic medicine, take it as told by your health care provider. Do not stop taking it even if your condition improves.  Wear shoes that fit well. Avoid wearing high-heeled shoes and shoes that are too tight or too loose.  Wear any padding, protective layers, gloves, or orthotics as told by your health care provider.  Soak your hands or feet. Then use a file or pumice stone to soften your corn or callus. Do this as told by your health care provider.  Check your corn or callus every day for signs of infection.   Contact a health care provider if:  Your symptoms do not improve with treatment.  You have redness or swelling that gets worse.  Your corn or callus becomes painful.  You have fluid, blood, or pus coming from your corn or callus.  You have new symptoms. Get help right away if:  You develop severe pain with redness. Summary  Corns are small areas of thickened skin that form on the top, sides,   or tip of a toe. These can be painful.  Calluses are areas of thickened skin that can form anywhere on the body, including the hands, fingers, palms, and soles of the feet. Calluses are usually larger than corns.  Corns and calluses are caused by rubbing (friction) or pressure, such as from shoes that are too tight or do not fit properly.  Treatment may include wearing padding, protective layers, gloves, or orthotics as told by your health care provider. This information is not intended to replace advice given to you by your health care  provider. Make sure you discuss any questions you have with your health care provider. Document Revised: 09/17/2019 Document Reviewed: 09/17/2019 Elsevier Patient Education  2021 Elsevier Inc.  

## 2020-11-09 NOTE — Progress Notes (Signed)
  Subjective:  Patient ID: Laurie [name], female    DOB: [birth year],  MRN: [id number]  Chief Complaint  Patient presents with  . Callouses  . Nail Problem    Patient presents today for painful callus bottom of bilat feet, Rt more painful than left and to discuss nail fungus bilat great toenails    87 y.o. female presents with the above complaint. History confirmed with patient.  She has tried using topical Fungi-Nail without relief  Objective:  Physical Exam: warm, good capillary refill, no trophic changes or ulcerative lesions, normal DP and PT pulses and normal sensory exam.  Onycholysis and onychomycosis of the bilateral hallux nail.  She has multiple porokeratosis including some at 5 bilateral submental 2 on the left mid arch on the right and 3 lesions on the plantar left heel Assessment:   1. Onychomycosis   2. Porokeratosis   3. Pain   4. Long term current use of anticoagulant therapy      Plan:  Patient was evaluated and treated and all questions answered.  All symptomatic hyperkeratoses were safely debrided with a sterile #15 blade to patient's level of comfort without incident. We discussed preventative and palliative care of these lesions including supportive and accommodative shoegear, padding, prefabricated and custom molded accommodative orthoses, use of a pumice stone and lotions/creams daily.  She requires expert care for this due to her chronic anticoagulation  Discussed etiology treatment options of onychomycosis with her.  Recommend topical treatment with Penlac.  Prescribed this for her.  she would not be a good candidate for Lamisil with her use of Xarelto   Return if symptoms worsen or fail to improve.

## 2020-11-10 ENCOUNTER — Ambulatory Visit (INDEPENDENT_AMBULATORY_CARE_PROVIDER_SITE_OTHER): Payer: Medicare HMO | Admitting: Internal Medicine

## 2020-11-10 ENCOUNTER — Encounter: Payer: Self-pay | Admitting: Internal Medicine

## 2020-11-10 ENCOUNTER — Other Ambulatory Visit: Payer: Self-pay

## 2020-11-10 VITALS — BP 142/84 | HR 68 | Temp 96.1°F | Resp 15 | Ht 62.0 in | Wt 134.6 lb

## 2020-11-10 DIAGNOSIS — Z789 Other specified health status: Secondary | ICD-10-CM

## 2020-11-10 DIAGNOSIS — I10 Essential (primary) hypertension: Secondary | ICD-10-CM | POA: Diagnosis not present

## 2020-11-10 DIAGNOSIS — Z8616 Personal history of COVID-19: Secondary | ICD-10-CM

## 2020-11-10 DIAGNOSIS — F4321 Adjustment disorder with depressed mood: Secondary | ICD-10-CM

## 2020-11-10 DIAGNOSIS — I700 Atherosclerosis of aorta: Secondary | ICD-10-CM | POA: Diagnosis not present

## 2020-11-10 DIAGNOSIS — Z7901 Long term (current) use of anticoagulants: Secondary | ICD-10-CM | POA: Diagnosis not present

## 2020-11-10 DIAGNOSIS — Z634 Disappearance and death of family member: Secondary | ICD-10-CM

## 2020-11-10 DIAGNOSIS — N3281 Overactive bladder: Secondary | ICD-10-CM | POA: Diagnosis not present

## 2020-11-10 DIAGNOSIS — E782 Mixed hyperlipidemia: Secondary | ICD-10-CM

## 2020-11-10 DIAGNOSIS — D6869 Other thrombophilia: Secondary | ICD-10-CM | POA: Diagnosis not present

## 2020-11-10 DIAGNOSIS — I482 Chronic atrial fibrillation, unspecified: Secondary | ICD-10-CM | POA: Diagnosis not present

## 2020-11-10 DIAGNOSIS — R69 Illness, unspecified: Secondary | ICD-10-CM | POA: Diagnosis not present

## 2020-11-10 DIAGNOSIS — D75839 Thrombocytosis, unspecified: Secondary | ICD-10-CM | POA: Diagnosis not present

## 2020-11-10 LAB — CBC WITH DIFFERENTIAL/PLATELET
Basophils Absolute: 0 10*3/uL (ref 0.0–0.1)
Basophils Relative: 0.8 % (ref 0.0–3.0)
Eosinophils Absolute: 0.2 10*3/uL (ref 0.0–0.7)
Eosinophils Relative: 3.4 % (ref 0.0–5.0)
HCT: 43.7 % (ref 36.0–46.0)
Hemoglobin: 14.5 g/dL (ref 12.0–15.0)
Lymphocytes Relative: 33.2 % (ref 12.0–46.0)
Lymphs Abs: 1.8 10*3/uL (ref 0.7–4.0)
MCHC: 33.2 g/dL (ref 30.0–36.0)
MCV: 90.7 fl (ref 78.0–100.0)
Monocytes Absolute: 0.3 10*3/uL (ref 0.1–1.0)
Monocytes Relative: 6.3 % (ref 3.0–12.0)
Neutro Abs: 3.1 10*3/uL (ref 1.4–7.7)
Neutrophils Relative %: 56.3 % (ref 43.0–77.0)
Platelets: 203 10*3/uL (ref 150.0–400.0)
RBC: 4.82 Mil/uL (ref 3.87–5.11)
RDW: 14.6 % (ref 11.5–15.5)
WBC: 5.5 10*3/uL (ref 4.0–10.5)

## 2020-11-10 LAB — LIPID PANEL
Cholesterol: 251 mg/dL — ABNORMAL HIGH (ref 0–200)
HDL: 58.8 mg/dL (ref >39.00)
LDL Cholesterol: 162 mg/dL — ABNORMAL HIGH (ref 0–99)
NonHDL: 192.16
Total CHOL/HDL Ratio: 4
Triglycerides: 150 mg/dL — ABNORMAL HIGH (ref 0.0–149.0)
VLDL: 30 mg/dL (ref 0.0–40.0)

## 2020-11-10 LAB — COMPREHENSIVE METABOLIC PANEL
ALT: 13 U/L (ref 0–35)
AST: 18 U/L (ref 0–37)
Albumin: 4.4 g/dL (ref 3.5–5.2)
Alkaline Phosphatase: 61 U/L (ref 39–117)
BUN: 16 mg/dL (ref 6–23)
CO2: 27 mEq/L (ref 19–32)
Calcium: 10.3 mg/dL (ref 8.4–10.5)
Chloride: 104 mEq/L (ref 96–112)
Creatinine, Ser: 0.86 mg/dL (ref 0.40–1.20)
GFR: 60.62 mL/min (ref >60.00)
Glucose, Bld: 103 mg/dL — ABNORMAL HIGH (ref 70–99)
Potassium: 4.6 mEq/L (ref 3.5–5.1)
Sodium: 140 mEq/L (ref 135–145)
Total Bilirubin: 0.9 mg/dL (ref 0.2–1.2)
Total Protein: 7.1 g/dL (ref 6.0–8.3)

## 2020-11-10 MED ORDER — ROSUVASTATIN CALCIUM 20 MG PO TABS
20.0000 mg | ORAL_TABLET | ORAL | 5 refills | Status: DC
Start: 2020-11-10 — End: 2021-04-26

## 2020-11-10 NOTE — Patient Instructions (Addendum)
I'm glad you are willing to  start taking rosuvastatin on Sundays and Wednesday  to prevent  strokes from placque rupture .     For your constipation:  fiber, water,  and stool softeners/bulk forming laxatives are fine to use daily (see below):  The first course is to increase the fiber in your diet to 25 g daily . Fruit and vegetables are good sources,  The [employer] and Atkins protein bars , and the low carb breads   (see below)  are all heavy on  fiber,    You can also take miralax, metamucil, fibercon, or citrucel daily to supplement your fiber. These are gentle and work in 1 to 2 days to relieve constipation, and  you can also combine them daily with colace,  A stool softener .  Also,  make 3 16 ounce servings of wtaer your minimum goal for water intake     HERE ARE THE LOW CARB  BREAD CHOICES  THAT ARE HIGHER IN FIBER THAN THE WELL KNOWN BREADS.  The [employer] MADE FROM WHOLE WHEAT HAS 26 G FIBER ! THESE CAN ALLALSO  BE BAKED TO CREATE PITA CHIPS

## 2020-11-10 NOTE — Progress Notes (Addendum)
Subjective:  Patient ID: Laurie [name], female    DOB: Mar 13, 1931  Age: 87 y.o. MRN: [id number]  CC: The primary encounter diagnosis was Thrombocytosis. Diagnoses of Aortic atherosclerosis (HCC), Chronic atrial fibrillation (HCC), Primary hypertension, History of COVID-19, Long term current use of anticoagulant therapy, Mixed hyperlipidemia, Overactive bladder, Acquired thrombophilia (HCC), Grief at loss of child, and Statin intolerance were also pertinent to this visit.  HPI Laurie [name] presents for follow up  up on multiple  conditions including aortic atherosclerosis,  OAB   This visit occurred during the SARS-CoV-2 public health emergency.  Safety protocols were in place, including screening questions prior to the visit, additional usage of staff PPE, and extensive cleaning of exam room while observing appropriate contact time as indicated for disinfecting solutions.    She has fully recovered from a mild Covid infection in January.  She remains unvaccinated by choice.  She is grieving the loss of her daughter who died unexpectedly of metastatic CA on dec 25 .  She was present along with many family members who had come together to rally her daughter's fight.   OAB bladder improving nocturia from 4 times per night to just  2 times;  using trospium  one daily dose .  Some mild S/e of dry mouth and constipation   Aortic atherosclerosis:  reviewed her lipid panel, her previous imaging study,  discussed the inherent risks of placque and placque report.    Outpatient Medications Prior to Visit  Medication Sig Dispense Refill   chlorpheniramine (CHLOR-TRIMETON) 4 MG tablet Take 4 mg by mouth 2 (two) times daily as needed.     Cholecalciferol (D3-1000 PO) Take 1 tablet by mouth daily.     ciclopirox (PENLAC) 8 % solution PLEASE SEE ATTACHED FOR DETAILED DIRECTIONS 6.6 mL 3   diltiazem (DILT-XR) 180 MG 24 hr capsule TAKE 1 CAPSULE BY MOUTH EVERY DAY 90 capsule 1   fish oil-omega-3 fatty  acids 1000 MG capsule Take 1 g by mouth daily.      lisinopril (ZESTRIL) 40 MG tablet TAKE 1 TABLET BY MOUTH TWICE A DAY 180 tablet 0   trospium (SANCTURA) 20 MG tablet Take 1 tablet (20 mg total) by mouth 2 (two) times daily. 60 tablet 2   XARELTO 20 MG TABS tablet TAKE 1 TABLET BY MOUTH EVERY DAY 90 tablet 1   rosuvastatin (CRESTOR) 20 MG tablet Take 1 tablet (20 mg total) by mouth 2 (two) times a week. (Patient not taking: Reported on 11/10/2020) 30 tablet 5   No facility-administered medications prior to visit.    Review of Systems;  Patient denies headache, fevers, malaise, unintentional weight loss, skin rash, eye pain, sinus congestion and sinus pain, sore throat, dysphagia,  hemoptysis , cough, dyspnea, wheezing, chest pain, palpitations, orthopnea, edema, abdominal pain, nausea, melena, diarrhea, constipation, flank pain, dysuria, hematuria, urinary  Frequency, nocturia, numbness, tingling, seizures,  Focal weakness, Loss of consciousness,  Tremor, insomnia, depression, anxiety, and suicidal ideation.      Objective:  BP (!) 142/84 (BP Location: Left Arm, Patient Position: Sitting, Cuff Size: Normal)   Pulse 68   Temp (!) 96.1 F (35.6 C) (Temporal)   Resp 15   Ht 5\' 2"  (1.575 m)   Wt 134 lb 9.6 oz (61.1 kg)   SpO2 99%   BMI 24.62 kg/m   BP Readings from Last 3 Encounters:  11/10/20 (!) 142/84  06/28/20 (!) 176/77  06/27/20 (!) 182/80    Wt Readings from Last  3 Encounters:  11/10/20 134 lb 9.6 oz (61.1 kg)  07/07/20 140 lb (63.5 kg)  06/27/20 142 lb 3.2 oz (64.5 kg)    General appearance: alert, cooperative and appears stated age Ears: normal TM's and external ear canals both ears Throat: lips, mucosa, and tongue normal; teeth and gums normal Neck: no adenopathy, no carotid bruit, supple, symmetrical, trachea midline and thyroid not enlarged, symmetric, no tenderness/mass/nodules Back: symmetric, no curvature. ROM normal. No CVA tenderness. Lungs: clear to  auscultation bilaterally Heart: regular rate and rhythm, S1, S2 normal, no murmur, click, rub or gallop Abdomen: soft, non-tender; bowel sounds normal; no masses,  no organomegaly Pulses: 2+ and symmetric Skin: Skin color, texture, turgor normal. No rashes or lesions Lymph nodes: Cervical, supraclavicular, and axillary nodes normal.  Lab Results  Component Value Date   HGBA1C 6.0 05/10/2020   HGBA1C 6.2 11/11/2019   HGBA1C 6.0 06/09/2019    Lab Results  Component Value Date   CREATININE 0.86 11/10/2020   CREATININE 0.63 06/27/2020   CREATININE 0.84 05/10/2020    Lab Results  Component Value Date   WBC 5.5 11/10/2020   HGB 14.5 11/10/2020   HCT 43.7 11/10/2020   PLT 203.0 11/10/2020   GLUCOSE 103 (H) 11/10/2020   CHOL 251 (H) 11/10/2020   TRIG 150.0 (H) 11/10/2020   HDL 58.80 11/10/2020   LDLDIRECT 148.0 01/29/2018   LDLCALC 162 (H) 11/10/2020   ALT 13 11/10/2020   AST 18 11/10/2020   NA 140 11/10/2020   K 4.6 11/10/2020   CL 104 11/10/2020   CREATININE 0.86 11/10/2020   BUN 16 11/10/2020   CO2 27 11/10/2020   TSH 2.77 01/29/2018   INR 1.0 11/16/2011   HGBA1C 6.0 05/10/2020   MICROALBUR 0.8 04/16/2017    CT Angio Chest PE W and/or Wo Contrast  Result Date: 06/28/2020 CLINICAL DATA:  87 year old female positive for COVID-19 06/17/2020. Shortness of breath. EXAM: CT ANGIOGRAPHY CHEST WITH CONTRAST TECHNIQUE: Multidetector CT imaging of the chest was performed using the standard protocol during bolus administration of intravenous contrast. Multiplanar CT image reconstructions and MIPs were obtained to evaluate the vascular anatomy. CONTRAST:  27mL OMNIPAQUE IOHEXOL 350 MG/ML SOLN COMPARISON:  Chest radiographs 06/27/2020. FINDINGS: Cardiovascular: Excellent contrast bolus timing in the pulmonary arterial tree. Mild respiratory motion. No focal filling defect identified in the pulmonary arteries to suggest acute pulmonary embolism. Calcified coronary artery  atherosclerosis. Calcified aortic atherosclerosis. Little contrast in the aorta. Borderline to mild cardiomegaly. No pericardial effusion. Mediastinum/Nodes: Negative. Lungs/Pleura: Small/trace bilateral layering pleural effusions. Major airways are patent. Confluent bilateral perihilar and peribronchial sub solid ground-glass and occasionally solid lung opacity. Superior segment lower lobes among the most affected. Right lung base relatively spared. Upper Abdomen: Evidence of sludge or stones within the gallbladder on series 4, image 153, but otherwise negative visible upper abdominal viscera. Musculoskeletal: No acute osseous abnormality identified. Review of the MIP images confirms the above findings. IMPRESSION: 1. Negative for acute pulmonary embolus. 2. Widespread bilateral pulmonary opacity typical for COVID-19 Pneumonia. Superimposed small layering pleural effusions. 3. Calcified coronary artery and Aortic Atherosclerosis (ICD10-I70.0). Electronically Signed   By: Odessa Fleming M.D.   On: 06/28/2020 09:59    Assessment & Plan:   Problem List Items Addressed This Visit       Unprioritized   Hypertension    Well controlled for age on lisinopril 40 mg bid. .  Renal function stable, no changes today.  Lab Results  Component Value Date   CREATININE  0.86 11/10/2020   Lab Results  Component Value Date   NA 140 11/10/2020   K 4.6 11/10/2020   CL 104 11/10/2020   CO2 27 11/10/2020          Relevant Medications   rosuvastatin (CRESTOR) 20 MG tablet   Chronic atrial fibrillation (HCC)    She remains rate controlled on Diltiazem CR and anticoagulated with Xarelto       Relevant Medications   rosuvastatin (CRESTOR) 20 MG tablet   Long term current use of anticoagulant therapy    She has had no adverse events from use of xarelto  Lab Results  Component Value Date   WBC 5.5 11/10/2020   HGB 14.5 11/10/2020   HCT 43.7 11/10/2020   MCV 90.7 11/10/2020   PLT 203.0 11/10/2020           Statin intolerance    She is willing to initiate a trial fo twice weekly rosuvastatin for management of hyperlipidemia and AA        Hyperlipidemia    Untreated secondary to age and statin intolerance, but she is willing to try taking Crestor 2 /weekly given the finding of AA   Lab Results  Component Value Date   CHOL 251 (H) 11/10/2020   HDL 58.80 11/10/2020   LDLCALC 162 (H) 11/10/2020   LDLDIRECT 148.0 01/29/2018   TRIG 150.0 (H) 11/10/2020   CHOLHDL 4 11/10/2020          Relevant Medications   rosuvastatin (CRESTOR) 20 MG tablet   Overactive bladder    Frequency at night improved. On once daily dosing of trospium.  Side effects addressed        Acquired thrombophilia (HCC)   History of COVID-19    She remains unvaccinated by choice and has fully recovered from her infection in January 2022        Aortic atherosclerosis [facility])    Reviewed findings of prior CT scan today..  Patient is now  willing to  Initiate statin therapy starting with 20 mg crestor twice weekly        Relevant Medications   rosuvastatin (CRESTOR) 20 MG tablet   Other Relevant Orders   Comprehensive metabolic panel (Completed)   Lipid panel (Completed)   Grief at loss of child    Patient is dealing with the unexpected loss of her daughter in December  and has adequate coping skills and emotional support .        Other Visit Diagnoses     Thrombocytosis    -  Primary   Relevant Orders   CBC with Differential/Platelet (Completed)       I provided  30 minutes of  face-to-face time during this encounter reviewing patient's recent COVID infection, resolving grief,  , labs and imaging studies, providing counseling on the risk of aortic atherosclerosis, and coordination  of care .   I am having Resha M. Hemmingway maintain her fish oil-omega-3 fatty acids, chlorpheniramine, Cholecalciferol (D3-1000 PO), Xarelto, lisinopril, trospium, diltiazem, ciclopirox, and rosuvastatin.  Meds ordered  this encounter  Medications   rosuvastatin (CRESTOR) 20 MG tablet    Sig: Take 1 tablet (20 mg total) by mouth 2 (two) times a week.    Dispense:  30 tablet    Refill:  5    KEEP ON FILE FOR FUTURE REFILLS    Medications Discontinued During This Encounter  Medication Reason   rosuvastatin (CRESTOR) 20 MG tablet Reorder    Follow-up: Return in about 6 months (  around 05/12/2021).   Sherlene Shams, MD

## 2020-11-13 DIAGNOSIS — F4321 Adjustment disorder with depressed mood: Secondary | ICD-10-CM | POA: Insufficient documentation

## 2020-11-13 DIAGNOSIS — Z634 Disappearance and death of family member: Secondary | ICD-10-CM | POA: Insufficient documentation

## 2020-11-13 NOTE — Assessment & Plan Note (Signed)
Well controlled for age on lisinopril 40 mg bid. .  Renal function stable, no changes today.  Lab Results  Component Value Date   CREATININE 0.86 11/10/2020   Lab Results  Component Value Date   NA 140 11/10/2020   K 4.6 11/10/2020   CL 104 11/10/2020   CO2 27 11/10/2020

## 2020-11-13 NOTE — Assessment & Plan Note (Addendum)
Untreated secondary to age and statin intolerance, but she is willing to try taking Crestor 2 /weekly given the finding of AA   Lab Results  Component Value Date   CHOL 251 (H) 11/10/2020   HDL 58.80 11/10/2020   LDLCALC 162 (H) 11/10/2020   LDLDIRECT 148.0 01/29/2018   TRIG 150.0 (H) 11/10/2020   CHOLHDL 4 11/10/2020

## 2020-11-13 NOTE — Assessment & Plan Note (Signed)
Patient is dealing with the unexpected loss of her daughter in December  and has adequate coping skills and emotional support .

## 2020-11-13 NOTE — Assessment & Plan Note (Signed)
She remains unvaccinated by choice and has fully recovered from her infection in January 2022

## 2020-11-13 NOTE — Assessment & Plan Note (Signed)
Frequency at night improved. On once daily dosing of trospium.  Side effects addressed

## 2020-11-13 NOTE — Assessment & Plan Note (Signed)
She remains rate controlled on Diltiazem CR and anticoagulated with Xarelto

## 2020-11-13 NOTE — Assessment & Plan Note (Signed)
Reviewed findings of prior CT scan today..  Patient is now  willing to  Initiate statin therapy starting with 20 mg crestor twice weekly

## 2020-11-13 NOTE — Assessment & Plan Note (Addendum)
She has had no adverse events from use of xarelto  Lab Results  Component Value Date   WBC 5.5 11/10/2020   HGB 14.5 11/10/2020   HCT 43.7 11/10/2020   MCV 90.7 11/10/2020   PLT 203.0 11/10/2020

## 2020-11-15 NOTE — Assessment & Plan Note (Signed)
She is willing to initiate a trial fo twice weekly rosuvastatin for management of hyperlipidemia and AA

## 2020-11-16 ENCOUNTER — Other Ambulatory Visit: Payer: Medicare HMO

## 2020-11-22 ENCOUNTER — Other Ambulatory Visit: Payer: Self-pay | Admitting: Internal Medicine

## 2020-11-29 ENCOUNTER — Other Ambulatory Visit: Payer: Self-pay | Admitting: Cardiovascular Disease

## 2020-11-29 NOTE — Telephone Encounter (Signed)
42f, 61.1kg, scr 0.86 11/10/20, lovw/arida 02/04/20, ccr 52.3

## 2020-11-29 NOTE — Telephone Encounter (Signed)
Please review for refill, Thanks !  

## 2020-12-06 ENCOUNTER — Telehealth: Payer: Self-pay | Admitting: Internal Medicine

## 2020-12-06 NOTE — Telephone Encounter (Signed)
Called and left a message to call back. There are no availabilities at this time. Pt needs to be instructed to go to Urgent care to be seen for possible UTI.

## 2020-12-06 NOTE — Telephone Encounter (Signed)
PT transfer to Access Nurse    PT called to advise she is having a burning sensation when she urinates. Currently states she has no other pain,discomfort or blood in urine yet. Was wanting to be seen but nothing available so sent to triage. Advise no apts to Santina Evans who picked up at the Triage Nurse.

## 2020-12-06 NOTE — Telephone Encounter (Signed)
Providing Access Nurse documentation    

## 2020-12-07 NOTE — Telephone Encounter (Signed)
Spoke with pt and she stated that she has an appt with Dr. Birdie Sons on the 11th and she will just wait till then. Pt was advised that if her symptoms worsened that she would need to be seen at [facility]. Pt gave a verbal understanding.

## 2020-12-12 ENCOUNTER — Ambulatory Visit (INDEPENDENT_AMBULATORY_CARE_PROVIDER_SITE_OTHER): Payer: Medicare HMO | Admitting: Family Medicine

## 2020-12-12 ENCOUNTER — Other Ambulatory Visit: Payer: Self-pay

## 2020-12-12 DIAGNOSIS — K429 Umbilical hernia without obstruction or gangrene: Secondary | ICD-10-CM | POA: Diagnosis not present

## 2020-12-12 DIAGNOSIS — R300 Dysuria: Secondary | ICD-10-CM | POA: Diagnosis not present

## 2020-12-12 LAB — POCT URINALYSIS DIPSTICK
Glucose, UA: NEGATIVE
Ketones, UA: 15
Leukocytes, UA: NEGATIVE
Nitrite, UA: NEGATIVE
Protein, UA: POSITIVE — AB
Spec Grav, UA: 1.02 (ref 1.010–1.025)
Urobilinogen, UA: 1 E.U./dL
pH, UA: 6.5 (ref 5.0–8.0)

## 2020-12-12 MED ORDER — CEPHALEXIN 500 MG PO CAPS
500.0000 mg | ORAL_CAPSULE | Freq: Four times a day (QID) | ORAL | 0 refills | Status: DC
Start: 2020-12-12 — End: 2021-03-01

## 2020-12-12 NOTE — Patient Instructions (Signed)
Nice to see you. I am going to place you on an antibiotic to treat you for UTI.  If this is not beneficial for your symptoms please let us know.  If you develop worsening symptoms please let me know. Please monitor the hernia at your bellybutton.  If you develop severe pain or a bulge in that area that does not go back inside your abdomen you need to be seen immediately.  If you develop mild discomfort there please contact us right away.

## 2020-12-12 NOTE — Assessment & Plan Note (Signed)
Fully reducible with no pain.  Discussed the option of monitoring versus seeing a Careers adviser.  Patient opted for monitoring which I think is reasonable.  She will contact us for any discomfort.  If she has significant discomfort or if the hernia protrudes and is not reducible she will be evaluated in the emergency department right away.

## 2020-12-12 NOTE — Progress Notes (Signed)
Marikay Alar, MD Phone: [phone]  Laurie [name] is a 87 y.o. female who presents today for same day visit.   Dysuria: Dysuria- yes Frequency- yes  Urgency- yes  Hematuria- no, notes she never noticed any blood in her urine  abd pain- no  Vaginal d/c- no Has recurred intermittently over the past year. Typically clears with antibiotics.    Social History   Tobacco Use  Smoking Status Never  Smokeless Tobacco Never    Current Outpatient Medications on File Prior to Visit  Medication Sig Dispense Refill   chlorpheniramine (CHLOR-TRIMETON) 4 MG tablet Take 4 mg by mouth 2 (two) times daily as needed.     Cholecalciferol (D3-1000 PO) Take 1 tablet by mouth daily.     ciclopirox (PENLAC) 8 % solution PLEASE SEE ATTACHED FOR DETAILED DIRECTIONS 6.6 mL 3   diltiazem (DILT-XR) 180 MG 24 hr capsule TAKE 1 CAPSULE BY MOUTH EVERY DAY 90 capsule 1   fish oil-omega-3 fatty acids 1000 MG capsule Take 1 g by mouth daily.      lisinopril (ZESTRIL) 40 MG tablet TAKE 1 TABLET BY MOUTH TWICE A DAY 180 tablet 0   rosuvastatin (CRESTOR) 20 MG tablet Take 1 tablet (20 mg total) by mouth 2 (two) times a week. 30 tablet 5   trospium (SANCTURA) 20 MG tablet TAKE 1 TABLET BY MOUTH TWICE A DAY 180 tablet 1   XARELTO 20 MG TABS tablet TAKE 1 TABLET BY MOUTH EVERY DAY 90 tablet 1   No current facility-administered medications on file prior to visit.     ROS see history of present illness  Objective  Physical Exam Vitals:   12/12/20 1523  BP: 140/70  Pulse: 100  Temp: 98.4 F (36.9 C)  SpO2: 97%    BP Readings from Last 3 Encounters:  12/12/20 140/70  11/10/20 (!) 142/84  06/28/20 (!) 176/77   Wt Readings from Last 3 Encounters:  12/12/20 136 lb 6.4 oz (61.9 kg)  11/10/20 134 lb 9.6 oz (61.1 kg)  07/07/20 140 lb (63.5 kg)    Physical Exam Constitutional:      General: She is not in acute distress.    Appearance: She is not diaphoretic.  Pulmonary:     Effort: Pulmonary effort  is normal.  Abdominal:     General: Bowel sounds are normal. There is no distension.     Palpations: Abdomen is soft.     Tenderness: no abdominal tenderness There is no guarding or rebound.     Hernia: A hernia (Umbilical hernia, fully reducible, nontender) is present.  Skin:    General: Skin is warm and dry.  Neurological:     Mental Status: She is alert.     Assessment/Plan: Please see individual problem list.  Problem List Items Addressed This Visit     Dysuria    Concern for UTI given symptoms.  Urinalysis does have trace blood though does not have enough urine to complete a urine culture.  We will send for urine micro.  We will treat empirically for UTI.  If her symptoms or not improving she will let us know.       Relevant Medications   cephALEXin (KEFLEX) 500 MG capsule   Other Relevant Orders   POCT Urinalysis Dipstick (Completed)   Urine Culture   Urine Microscopic   Umbilical hernia    Fully reducible with no pain.  Discussed the option of monitoring versus seeing a Careers adviser.  Patient opted for monitoring which I  think is reasonable.  She will contact us for any discomfort.  If she has significant discomfort or if the hernia protrudes and is not reducible she will be evaluated in the emergency department right away.         Return for As scheduled.  This visit occurred during the SARS-CoV-2 public health emergency.  Safety protocols were in place, including screening questions prior to the visit, additional usage of staff PPE, and extensive cleaning of exam room while observing appropriate contact time as indicated for disinfecting solutions.    Marikay Alar, MD [facility]

## 2020-12-12 NOTE — Assessment & Plan Note (Addendum)
Concern for UTI given symptoms.  Urinalysis does have trace blood.  We will send for urine culture and microscopy.  We will treat empirically for UTI.  If her symptoms are not improving she will let us know.

## 2020-12-13 LAB — URINALYSIS, MICROSCOPIC ONLY

## 2020-12-13 LAB — URINE CULTURE
MICRO NUMBER:: 12103470
SPECIMEN QUALITY:: ADEQUATE

## 2020-12-17 ENCOUNTER — Other Ambulatory Visit: Payer: Self-pay | Admitting: Family Medicine

## 2020-12-17 DIAGNOSIS — R300 Dysuria: Secondary | ICD-10-CM

## 2020-12-19 ENCOUNTER — Telehealth: Payer: Self-pay | Admitting: Internal Medicine

## 2020-12-19 NOTE — Telephone Encounter (Signed)
Patient returned referrals phone call. 

## 2020-12-19 NOTE — Telephone Encounter (Signed)
Lft pt vm to call ofc regarding referral to urologist. thanks

## 2020-12-25 NOTE — Progress Notes (Signed)
12/26/2020 10:07 PM   [name] June 20, 1930 [id number]  Referring provider: Glori Luis, MD [address] [place],  [place] [address]  Chief Complaint  Patient presents with   Dysuria    HPI: Laurie [name] is an 87 y.o. female referred for evaluation of hematuria.  Several month history of urinary frequency, urgency with urge incontinence Denied dysuria or gross hematuria Was placed on trospium 20 mg twice daily by Dr. Darrick Huntsman approximately 2 months ago with marked improvement in her symptoms, she is only taking the medication once a day Recent visit with Dr. Birdie Sons noted to have trace blood on dipstick.  No microscopy was performed Is on chronic anticoagulation with Xarelto   PMH: Past Medical History:  Diagnosis Date   Arrhythmia    Hypertension     Surgical History: Past Surgical History:  Procedure Laterality Date   TONSILLECTOMY      Home Medications:  Allergies as of 12/26/2020       Reactions   Hydrochlorothiazide    Losartan    angioedema   Metoprolol    Triamterene         Medication List        Accurate as of December 26, 2020 11:59 PM. If you have any questions, ask your nurse or doctor.          cephALEXin 500 MG capsule Commonly known as: KEFLEX Take 1 capsule (500 mg total) by mouth 4 (four) times daily.   chlorpheniramine 4 MG tablet Commonly known as: CHLOR-TRIMETON Take 4 mg by mouth 2 (two) times daily as needed.   ciclopirox 8 % solution Commonly known as: PENLAC PLEASE SEE ATTACHED FOR DETAILED DIRECTIONS   D3-1000 PO Take 1 tablet by mouth daily.   diltiazem 180 MG 24 hr capsule Commonly known as: Dilt-XR TAKE 1 CAPSULE BY MOUTH EVERY DAY   fish oil-omega-3 fatty acids 1000 MG capsule Take 1 g by mouth daily.   lisinopril 40 MG tablet Commonly known as: ZESTRIL TAKE 1 TABLET BY MOUTH TWICE A DAY   rosuvastatin 20 MG tablet Commonly known as: CRESTOR Take 1 tablet (20 mg total) by mouth  2 (two) times a week.   trospium 20 MG tablet Commonly known as: SANCTURA TAKE 1 TABLET BY MOUTH TWICE A DAY   Xarelto 20 MG Tabs tablet Generic drug: rivaroxaban TAKE 1 TABLET BY MOUTH EVERY DAY        Allergies:  Allergies  Allergen Reactions   Hydrochlorothiazide    Losartan     angioedema   Metoprolol    Triamterene     Family History: Family History  Problem Relation Age of Onset   Kidney disease Mother    Anemia Mother    Heart disease Father 81    Social History:  reports that she has never smoked. She has never used smokeless tobacco. She reports that she does not drink alcohol and does not use drugs.   Physical Exam: BP (!) 142/72   Pulse 76   Ht 5\' 4"  (1.626 m)   Wt 130 lb (59 kg)   BMI 22.31 kg/m   Constitutional:  Alert and oriented, No acute distress. HEENT: [place] AT, moist mucus membranes.  Trachea midline, no masses. Cardiovascular: No clubbing, cyanosis, or edema. Respiratory: Normal respiratory effort, no increased work of breathing. Neurologic: Grossly intact, no focal deficits, moving all 4 extremities. Psychiatric: Normal mood and affect.  Laboratory Data:  Urinalysis Dipstick 1+ blood/trace leukocytes Microscopy 3-10  RBC  Assessment & Plan:    1.  Microhematuria UA today with minimal microhematuria at 3-10 RBC Based on age AUA hematuria risk stratification: High No previous smoking history and unlikely to have collecting system abnormalities Will initially order a renal ultrasound and recommended cystoscopy for lower tract evaluation CTU for persistent or worsening microhematuria  2.  Overactive bladder with urge incontinence Stable on trospium   Riki Altes, MD  [facility] [address], [address] [place], [place] [address] [phone]

## 2020-12-26 ENCOUNTER — Encounter: Payer: Self-pay | Admitting: Urology

## 2020-12-26 ENCOUNTER — Ambulatory Visit (INDEPENDENT_AMBULATORY_CARE_PROVIDER_SITE_OTHER): Payer: Medicare HMO | Admitting: Urology

## 2020-12-26 ENCOUNTER — Other Ambulatory Visit: Payer: Self-pay

## 2020-12-26 VITALS — BP 142/72 | HR 76 | Ht 64.0 in | Wt 130.0 lb

## 2020-12-26 DIAGNOSIS — R3129 Other microscopic hematuria: Secondary | ICD-10-CM | POA: Diagnosis not present

## 2020-12-26 DIAGNOSIS — R300 Dysuria: Secondary | ICD-10-CM | POA: Diagnosis not present

## 2020-12-26 DIAGNOSIS — N3941 Urge incontinence: Secondary | ICD-10-CM

## 2020-12-26 DIAGNOSIS — N3281 Overactive bladder: Secondary | ICD-10-CM | POA: Diagnosis not present

## 2020-12-26 LAB — URINALYSIS, COMPLETE
Bilirubin, UA: NEGATIVE
Glucose, UA: NEGATIVE
Ketones, UA: NEGATIVE
Nitrite, UA: NEGATIVE
Protein,UA: NEGATIVE
Specific Gravity, UA: 1.01 (ref 1.005–1.030)
Urobilinogen, Ur: 0.2 mg/dL (ref 0.2–1.0)
pH, UA: 7 (ref 5.0–7.5)

## 2020-12-26 LAB — MICROSCOPIC EXAMINATION: Bacteria, UA: NONE SEEN

## 2020-12-28 DIAGNOSIS — N3281 Overactive bladder: Secondary | ICD-10-CM | POA: Diagnosis not present

## 2020-12-28 DIAGNOSIS — E785 Hyperlipidemia, unspecified: Secondary | ICD-10-CM | POA: Diagnosis not present

## 2020-12-28 DIAGNOSIS — I10 Essential (primary) hypertension: Secondary | ICD-10-CM | POA: Diagnosis not present

## 2020-12-28 DIAGNOSIS — I4891 Unspecified atrial fibrillation: Secondary | ICD-10-CM | POA: Diagnosis not present

## 2020-12-28 DIAGNOSIS — Z7901 Long term (current) use of anticoagulants: Secondary | ICD-10-CM | POA: Diagnosis not present

## 2020-12-28 DIAGNOSIS — I739 Peripheral vascular disease, unspecified: Secondary | ICD-10-CM | POA: Diagnosis not present

## 2020-12-31 ENCOUNTER — Encounter: Payer: Self-pay | Admitting: Urology

## 2021-01-30 ENCOUNTER — Ambulatory Visit
Admission: RE | Admit: 2021-01-30 | Discharge: 2021-01-30 | Disposition: A | Discharge status: Home / Self Care | Payer: Medicare HMO | Source: Ambulatory Visit | Attending: Urology | Admitting: Urology

## 2021-01-30 ENCOUNTER — Other Ambulatory Visit: Payer: Self-pay

## 2021-01-30 DIAGNOSIS — N281 Cyst of kidney, acquired: Secondary | ICD-10-CM | POA: Diagnosis not present

## 2021-01-30 DIAGNOSIS — R3129 Other microscopic hematuria: Secondary | ICD-10-CM | POA: Diagnosis not present

## 2021-02-02 ENCOUNTER — Other Ambulatory Visit: Payer: Self-pay | Admitting: Urology

## 2021-02-03 ENCOUNTER — Telehealth: Payer: Self-pay | Admitting: *Deleted

## 2021-02-03 NOTE — Telephone Encounter (Signed)
-----   Message from Scott C Stoioff, MD sent at 02/02/2021  5:04 PM EDT ----- Renal ultrasound was normal.  She was a no-show for her cystoscopy today 

## 2021-02-03 NOTE — Telephone Encounter (Signed)
-----   Message from Riki Altes, MD sent at 02/02/2021  5:04 PM EDT ----- Renal ultrasound was normal.  She was a no-show for her cystoscopy today

## 2021-02-03 NOTE — Telephone Encounter (Signed)
Notified patient as instructed, patient pleased. Discussed follow-up appointments, patient agrees  

## 2021-02-05 IMAGING — CT CT ANGIO CHEST
2 of 6 series · 18 of 36 positions shown · IV contrast (OMNIPAQUE 350)
Comparison: Chest radiographs 06/27/2020.

CLINICAL DATA: 89-year-old female positive for 1EFQL-BK 06/17/2020.
Shortness of breath.

EXAM:
CT ANGIOGRAPHY CHEST WITH CONTRAST
TECHNIQUE: Multidetector CT imaging of the chest was performed using the
standard protocol during bolus administration of intravenous
contrast. Multiplanar CT image reconstructions and MIPs were
obtained to evaluate the vascular anatomy.
CONTRAST:  63mL OMNIPAQUE IOHEXOL 350 MG/ML SOLN

[Series 5: thins · axial · 0.68mm/px · z∈[-308,-37]mm · 17 of 305 slices shown]
[im 17/305  lung]
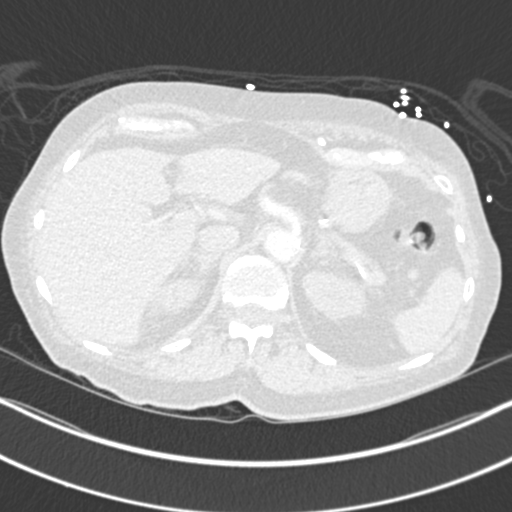
[im 34/305  mediastinal]
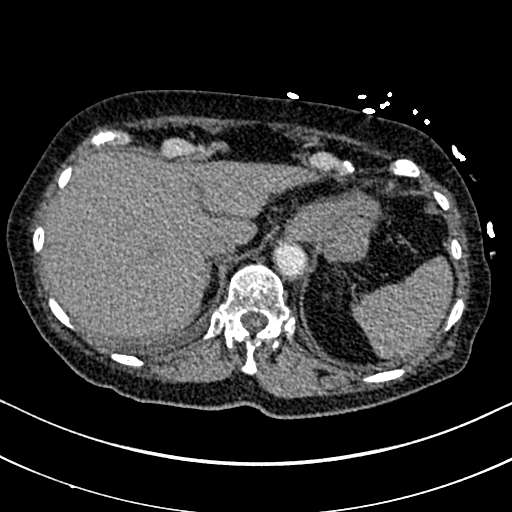
[im 51/305  lung]
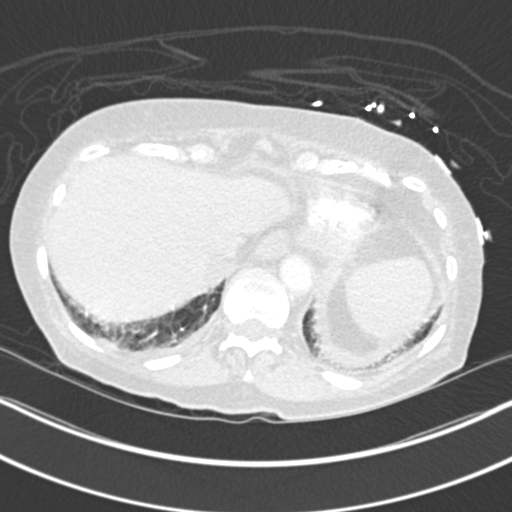
[im 68/305  mediastinal]
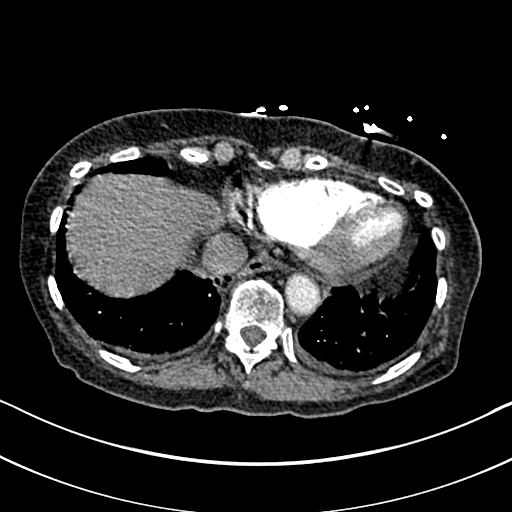
[im 85/305  lung]
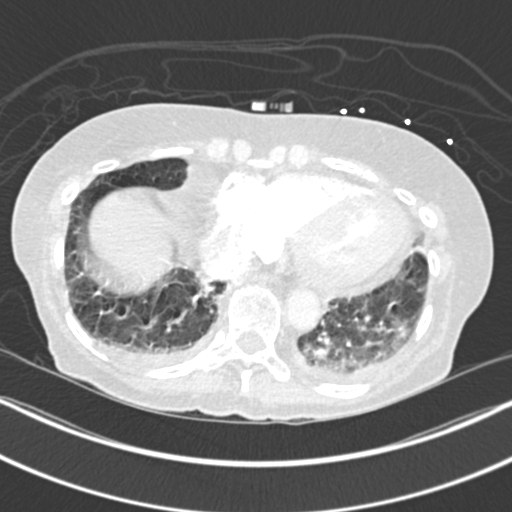
[im 102/305  mediastinal]
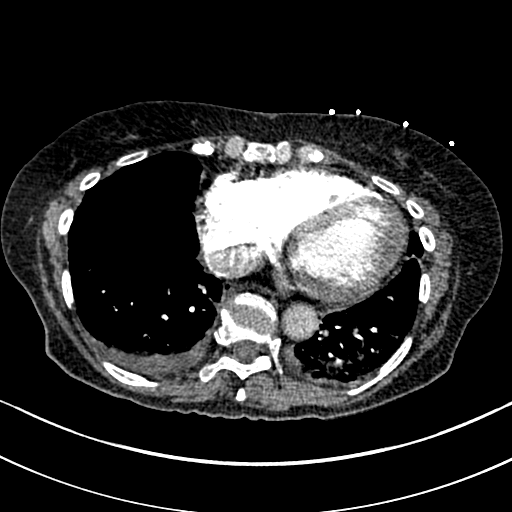
[im 119/305  lung]
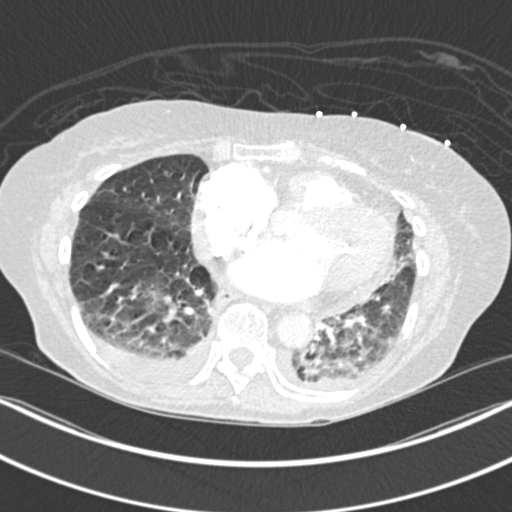
[im 136/305  mediastinal]
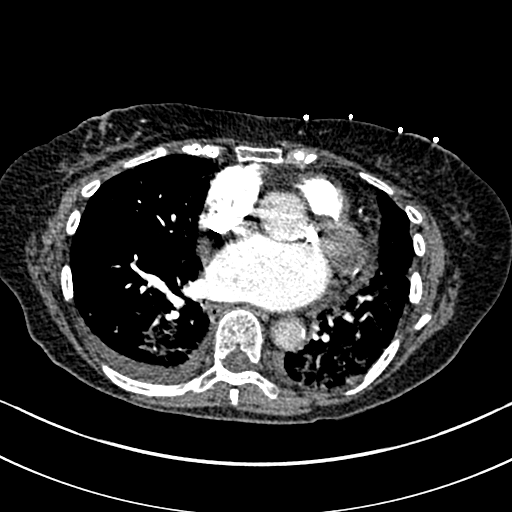
[im 153/305  lung]
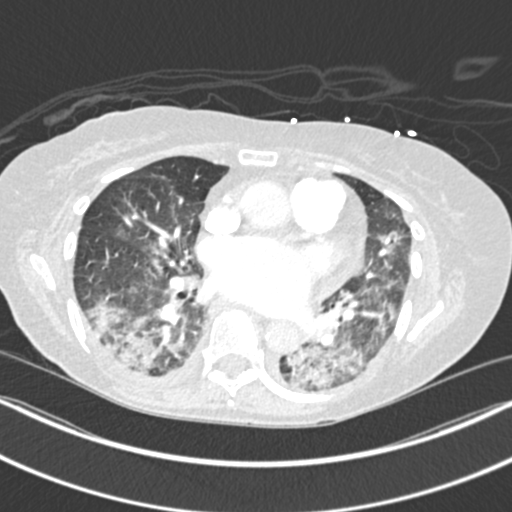
[im 169/305  mediastinal]
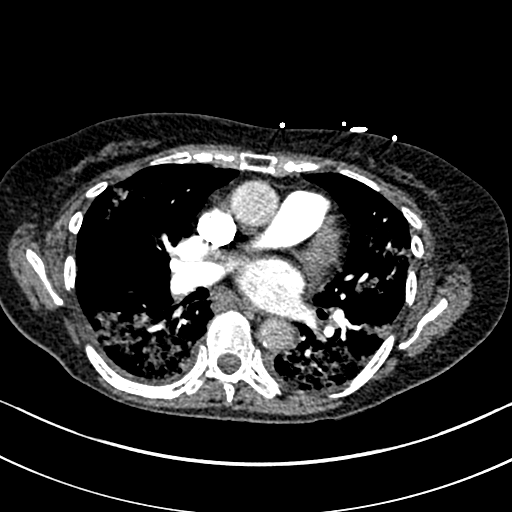
[im 186/305  lung]
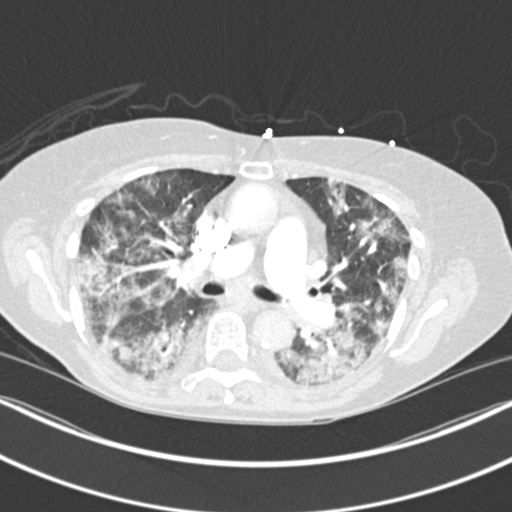
[im 203/305  mediastinal]
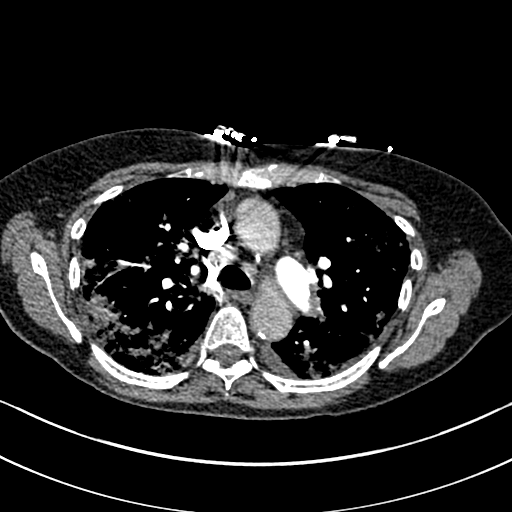
[im 220/305  lung]
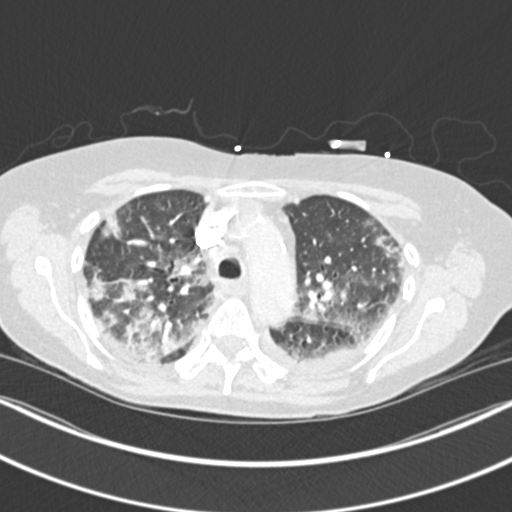
[im 237/305  mediastinal]
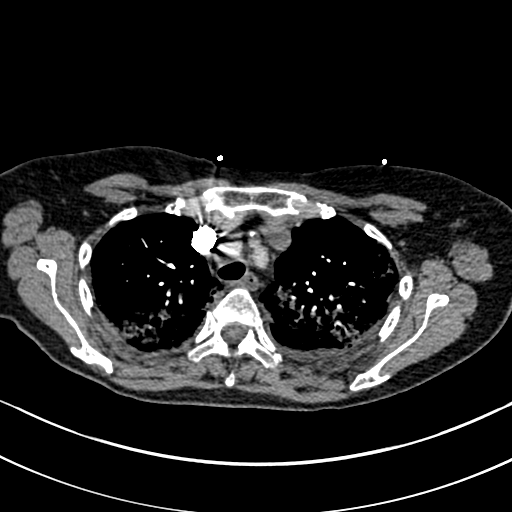
[im 254/305  lung]
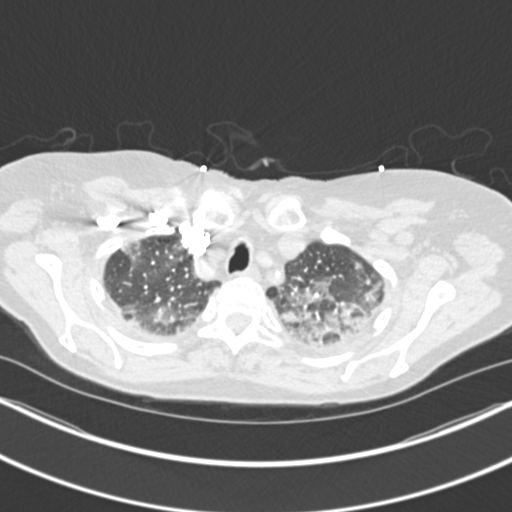
[im 271/305  mediastinal]
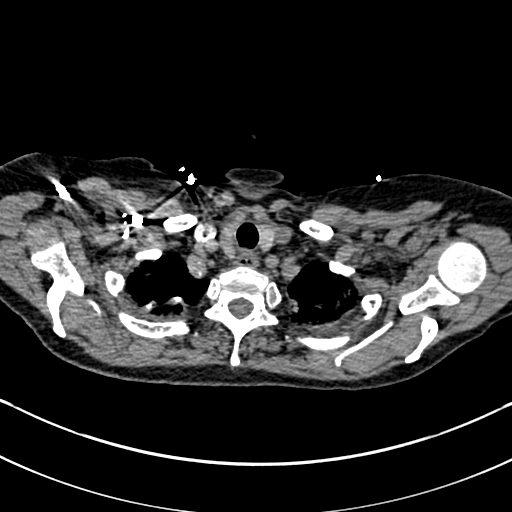
[im 288/305  lung]
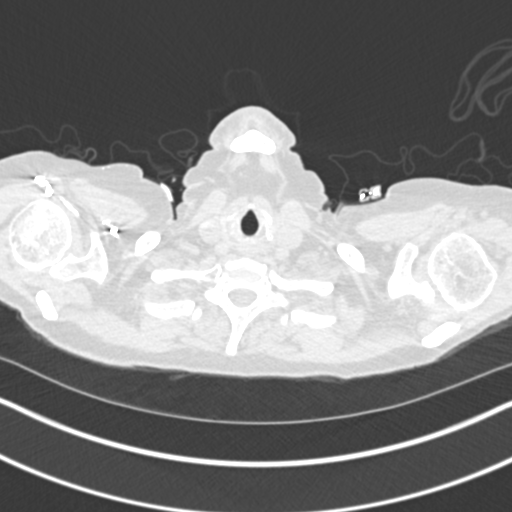

[Series 7: coronal mpr · coronal · 0.61mm/px · 1 of 127 slices shown]
[im 64/127  mediastinal]
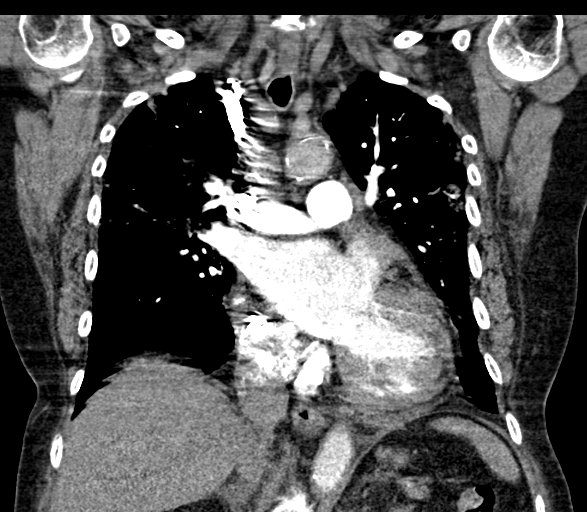

[18 of 36 positions shown; findings below may reference images not displayed]

FINDINGS: Cardiovascular: Excellent contrast bolus timing in the pulmonary
arterial tree. Mild respiratory motion.

No focal filling defect identified in the pulmonary arteries to
suggest acute pulmonary embolism.

Calcified coronary artery atherosclerosis. Calcified aortic
atherosclerosis. Little contrast in the aorta. Borderline to mild
cardiomegaly. No pericardial effusion.

Mediastinum/Nodes: Negative.

Lungs/Pleura: Small/trace bilateral layering pleural effusions.

Major airways are patent.

Confluent bilateral perihilar and peribronchial sub solid
ground-glass and occasionally solid lung opacity. Superior segment
lower lobes among the most affected. Right lung base relatively
spared.

Upper Abdomen: Evidence of sludge or stones within the gallbladder
on series 4, image 153, but otherwise negative visible upper
abdominal viscera.

Musculoskeletal: No acute osseous abnormality identified.

Review of the MIP images confirms the above findings.
IMPRESSION: 1. Negative for acute pulmonary embolus.
2. Widespread bilateral pulmonary opacity typical for 1EFQL-BK
Pneumonia. Superimposed small layering pleural effusions.
3. Calcified coronary artery and Aortic Atherosclerosis
([id number]-[id number]).

## 2021-02-07 ENCOUNTER — Ambulatory Visit: Payer: Medicare HMO | Admitting: Cardiology

## 2021-02-09 DIAGNOSIS — M13862 Other specified arthritis, left knee: Secondary | ICD-10-CM | POA: Diagnosis not present

## 2021-02-09 DIAGNOSIS — M533 Sacrococcygeal disorders, not elsewhere classified: Secondary | ICD-10-CM | POA: Diagnosis not present

## 2021-02-13 ENCOUNTER — Ambulatory Visit: Payer: Medicare HMO | Admitting: Cardiology

## 2021-02-13 ENCOUNTER — Other Ambulatory Visit: Payer: Self-pay

## 2021-02-23 ENCOUNTER — Other Ambulatory Visit: Payer: Medicare HMO | Admitting: Urology

## 2021-02-23 DIAGNOSIS — M533 Sacrococcygeal disorders, not elsewhere classified: Secondary | ICD-10-CM | POA: Diagnosis not present

## 2021-02-23 DIAGNOSIS — M13862 Other specified arthritis, left knee: Secondary | ICD-10-CM | POA: Diagnosis not present

## 2021-02-28 ENCOUNTER — Other Ambulatory Visit: Payer: Self-pay

## 2021-02-28 ENCOUNTER — Encounter: Payer: Self-pay | Admitting: Cardiovascular Disease

## 2021-02-28 ENCOUNTER — Ambulatory Visit: Payer: Medicare HMO | Admitting: Cardiovascular Disease

## 2021-02-28 VITALS — BP 152/70 | HR 61 | Ht 64.0 in | Wt 139.2 lb

## 2021-02-28 DIAGNOSIS — I10 Essential (primary) hypertension: Secondary | ICD-10-CM | POA: Diagnosis not present

## 2021-02-28 DIAGNOSIS — E785 Hyperlipidemia, unspecified: Secondary | ICD-10-CM | POA: Diagnosis not present

## 2021-02-28 DIAGNOSIS — I482 Chronic atrial fibrillation, unspecified: Secondary | ICD-10-CM | POA: Diagnosis not present

## 2021-02-28 NOTE — Patient Instructions (Signed)

## 2021-02-28 NOTE — Progress Notes (Signed)
Cardiology Office Note   Date:  02/28/2021   ID:  Laurie [name], Laurie [name] Jul 19, 1930, MRN [id number]  PCP:  Sherlene Shams, MD  Cardiologist:   Lorine Bears, MD   Chief Complaint  Patient presents with   OTHER    12 month f/u no complaints today. Meds reviewed verbally with pt.      History of Present Illness: Laurie [name] is a 87 y.o. female who presents for a followup visit regarding chronic atrial fibrillation.  She is being treated with diltiazem for rate control. Echocardiogram in 2013 showed normal LV systolic function and mildly dilated left atrium. She is on anticoagulation with Xarelto.   She has been doing well with no recent chest pain, shortness of breath or palpitations.  Unfortunately, her only daughter died of cancer on Christmas day.  No side effects with anticoagulation.  She continues to be active in her local church and volunteers at [employer]   Past Medical History:  Diagnosis Date   Arrhythmia    Hypertension     Past Surgical History:  Procedure Laterality Date   TONSILLECTOMY       Current Outpatient Medications  Medication Sig Dispense Refill   chlorpheniramine (CHLOR-TRIMETON) 4 MG tablet Take 4 mg by mouth 2 (two) times daily as needed.     cephALEXin (KEFLEX) 500 MG capsule Take 1 capsule (500 mg total) by mouth 4 (four) times daily. (Patient not taking: Reported on 02/28/2021) 28 capsule 0   Cholecalciferol (D3-1000 PO) Take 1 tablet by mouth daily.     ciclopirox (PENLAC) 8 % solution PLEASE SEE ATTACHED FOR DETAILED DIRECTIONS (Patient not taking: Reported on 02/28/2021) 6.6 mL 3   diltiazem (DILT-XR) 180 MG 24 hr capsule TAKE 1 CAPSULE BY MOUTH EVERY DAY 90 capsule 1   fish oil-omega-3 fatty acids 1000 MG capsule Take 1 g by mouth daily.      lisinopril (ZESTRIL) 40 MG tablet TAKE 1 TABLET BY MOUTH TWICE A DAY 180 tablet 0   rosuvastatin (CRESTOR) 20 MG tablet Take 1 tablet (20 mg total) by mouth 2 (two) times a week. 30  tablet 5   trospium (SANCTURA) 20 MG tablet TAKE 1 TABLET BY MOUTH TWICE A DAY 180 tablet 1   XARELTO 20 MG TABS tablet TAKE 1 TABLET BY MOUTH EVERY DAY 90 tablet 1   No current facility-administered medications for this visit.    Allergies:   Hydrochlorothiazide, Losartan, Metoprolol, and Triamterene    Social History:  The patient  reports that she has never smoked. She has never used smokeless tobacco. She reports that she does not drink alcohol and does not use drugs.   Family History:  The patient's family history includes Anemia in her mother; Heart disease (age of onset: 79) in her father; Kidney disease in her mother.    ROS:  Please see the history of present illness.   Otherwise, review of systems are positive for none.   All other systems are reviewed and negative.    PHYSICAL EXAM: VS:  BP (!) 152/70 (BP Location: Left Arm, Patient Position: Sitting, Cuff Size: Normal)   Pulse 61   Ht 5\' 4"  (1.626 m)   Wt 139 lb 4 oz (63.2 kg)   SpO2 98%   BMI 23.90 kg/m  , BMI Body mass index is 23.9 kg/m. GEN: Well nourished, well developed, in no acute distress  HEENT: normal  Neck: no JVD, carotid bruits, or masses Cardiac: Irregularly irregular;  no murmurs, rubs, or gallops,no edema  Respiratory:  clear to auscultation bilaterally, normal work of breathing GI: soft, nontender, nondistended, + BS MS: no deformity or atrophy  Skin: warm and dry, no rash Neuro:  Strength and sensation are intact Psych: euthymic mood, full affect   EKG:  EKG is ordered today. The ekg ordered today demonstrates atrial fibrillation with left axis deviation and nonspecific ST changes.   Recent Labs: 11/10/2020: ALT 13; BUN 16; Creatinine, Ser 0.86; Hemoglobin 14.5; Platelets 203.0; Potassium 4.6; Sodium 140    Lipid Panel    Component Value Date/Time   CHOL 251 (H) 11/10/2020 1000   TRIG 150.0 (H) 11/10/2020 1000   HDL 58.80 11/10/2020 1000   CHOLHDL 4 11/10/2020 1000   VLDL 30.0  11/10/2020 1000   LDLCALC 162 (H) 11/10/2020 1000   LDLCALC 154 (H) 11/07/2018 1502   LDLDIRECT 148.0 01/29/2018 0911      Wt Readings from Last 3 Encounters:  02/28/21 139 lb 4 oz (63.2 kg)  12/26/20 130 lb (59 kg)  12/12/20 136 lb 6.4 oz (61.9 kg)         ASSESSMENT AND PLAN:  1.  Chronic atrial fibrillation: Ventricular rate is well controlled on current dose of diltiazem extended release 180 mg once daily. She is tolerating anticoagulation with Xarelto. I reviewed most recent labs done in June which showed normal renal function.  2. Essential hypertension: Blood pressure is mildly elevated today.  Continue to monitor for now.  She is on diltiazem and lisinopril.  3.  Hyperlipidemia: She takes rosuvastatin 20 mg twice a week.  She tolerates the medication well.   Disposition:   FU with me in 1 year  Signed,  Lorine Bears, MD  02/28/2021 1:20 PM    [facility]

## 2021-03-01 ENCOUNTER — Other Ambulatory Visit: Payer: Medicare HMO | Admitting: Urology

## 2021-03-01 NOTE — Progress Notes (Deleted)
   03/01/21  CC: No chief complaint on file.   HPI: 87 y.o. female seen 12/26/2020 with frequency, urgency urge incontinence.  3-10 RBCs on microscopy.  RUS 01/30/2021 with simple right renal cyst   Refer to rooming tab for vitals NED. A&Ox3.   No respiratory distress   Abd soft, NT, ND Normal external genitalia with patent urethral meatus  Cystoscopy Procedure Note  Patient identification was confirmed, informed consent was obtained, and patient was prepped using Betadine solution.  Lidocaine jelly was administered per urethral meatus.    Procedure: - Flexible cystoscope introduced, without any difficulty.   - Thorough search of the bladder revealed:    normal urethral meatus    normal urothelium    no stones    no ulcers     no tumors    no urethral polyps    no trabeculation  - Ureteral orifices were normal in position and appearance.  Post-Procedure: - Patient tolerated the procedure well  Assessment/ Plan:    No follow-ups on file.  Riki Altes, MD

## 2021-03-06 ENCOUNTER — Encounter: Payer: Self-pay | Admitting: Urology

## 2021-03-06 ENCOUNTER — Other Ambulatory Visit: Payer: Self-pay

## 2021-03-06 ENCOUNTER — Ambulatory Visit: Payer: Medicare HMO | Admitting: Urology

## 2021-03-06 VITALS — BP 100/79 | HR 80 | Ht 64.0 in | Wt 132.0 lb

## 2021-03-06 DIAGNOSIS — R3129 Other microscopic hematuria: Secondary | ICD-10-CM | POA: Diagnosis not present

## 2021-03-06 NOTE — Progress Notes (Signed)
   03/06/21  CC:  Chief Complaint  Patient presents with   Cysto    HPI: Initially seen 12/26/2020 for microhematuria.  RUS with simple right renal cyst  Blood pressure 100/79, pulse 80, height 5\' 4"  (1.626 m), weight 132 lb (59.9 kg). NED. A&Ox3.   No respiratory distress   Abd soft, NT, ND Atrophic external genitalia with patent urethral meatus  Cystoscopy Procedure Note  Patient identification was confirmed, informed consent was obtained, and patient was prepped using Betadine solution.  Lidocaine jelly was administered per urethral meatus.    Procedure: - Flexible cystoscope introduced, without any difficulty.   - Thorough search of the bladder revealed:    normal urethral meatus    normal urothelium    no stones    no ulcers     no tumors    no urethral polyps    no trabeculation  - Ureteral orifices were normal in position and appearance.  Post-Procedure: - Patient tolerated the procedure well  Assessment/ Plan: Negative renal ultrasound No mucosal abnormalities on cystoscopy Urinalysis today showed no RBCs 80-month follow-up for repeat UA CT urogram for increasing hematuria   8-month, MD

## 2021-03-07 LAB — MICROSCOPIC EXAMINATION

## 2021-03-07 LAB — URINALYSIS, COMPLETE
Bilirubin, UA: NEGATIVE
Glucose, UA: NEGATIVE
Ketones, UA: NEGATIVE
Leukocytes,UA: NEGATIVE
Nitrite, UA: NEGATIVE
Protein,UA: NEGATIVE
Specific Gravity, UA: 1.015 (ref 1.005–1.030)
Urobilinogen, Ur: 0.2 mg/dL (ref 0.2–1.0)
pH, UA: 7 (ref 5.0–7.5)

## 2021-03-13 DIAGNOSIS — M13862 Other specified arthritis, left knee: Secondary | ICD-10-CM | POA: Diagnosis not present

## 2021-03-13 DIAGNOSIS — M533 Sacrococcygeal disorders, not elsewhere classified: Secondary | ICD-10-CM | POA: Diagnosis not present

## 2021-03-21 ENCOUNTER — Telehealth: Payer: Self-pay | Admitting: Pharmacist

## 2021-03-21 MED ORDER — LISINOPRIL 40 MG PO TABS: 40.0000 mg | ORAL_TABLET | Freq: Two times a day (BID) | ORAL | 0 refills | Status: DC

## 2021-03-21 NOTE — Addendum Note (Signed)
Addended by: Sherlene Shams on: 03/21/2021 04:54 PM   Modules accepted: Orders

## 2021-03-21 NOTE — Telephone Encounter (Signed)
This patient is appearing on the insurance-provided list for being at risk of failing the adherence measure for hypertension (ACEi/ARB) medications this calendar year.   Medication: lisinopril 40 mg Last fill date: 10/15/20  Confirmed with the pharmacy that the last fill date for the medication was 10/15/20 for a 90 day supply (180 tablets).   Contacted patient. She denies any issues taking this medication, remembering to take it, or getting refills. She confirms that she has been taking this twice daily regularly.   Per prescription history a 90 day supply was sent in June, so she will be due for a refill prior to next PCP visit in December. Will forward to PCP to send 90 day refill on lisinopril if appropriate.

## 2021-03-21 NOTE — Telephone Encounter (Signed)
Lisinopril refilled.

## 2021-04-03 DIAGNOSIS — M1712 Unilateral primary osteoarthritis, left knee: Secondary | ICD-10-CM | POA: Diagnosis not present

## 2021-04-12 ENCOUNTER — Ambulatory Visit (INDEPENDENT_AMBULATORY_CARE_PROVIDER_SITE_OTHER): Payer: Medicare HMO

## 2021-04-12 VITALS — Ht 64.0 in | Wt 132.0 lb

## 2021-04-12 DIAGNOSIS — Z0000 Encounter for general adult medical examination without abnormal findings: Secondary | ICD-10-CM

## 2021-04-12 NOTE — Progress Notes (Signed)
Subjective:   Laurie [name] is a 87 y.o. female who presents for Medicare Annual (Subsequent) preventive examination.  Review of Systems    No ROS.  Medicare Wellness Virtual Visit.  Visual/audio telehealth visit, UTA vital signs.   See social history for additional risk factors.   Cardiac Risk Factors include: advanced age (>6men, >67 women)     Objective:    Today's Vitals   04/12/21 1404  Weight: 132 lb (59.9 kg)  Height: 5\' 4"  (1.626 m)   Body mass index is 22.66 kg/m.  Advanced Directives 04/12/2021 06/27/2020 04/11/2020 01/28/2019 01/23/2018 08/29/2016  Does Patient Have a Medical Advance Directive? No No Yes Yes Yes Yes  Type of Advance Directive - Programmer, multimedia of Attorney Living will [place];Living will  Does patient want to make changes to medical advance directive? - - No - Patient declined No - Patient declined No - Patient declined No - Patient declined  Copy of [place] in Chart? - - No - copy requested No - copy requested - No - copy requested  Would patient like information on creating a medical advance directive? No - Patient declined No - Patient declined - - - -    Current Medications (verified) Outpatient Encounter Medications as of 04/12/2021  Medication Sig   chlorpheniramine (CHLOR-TRIMETON) 4 MG tablet Take 4 mg by mouth 2 (two) times daily as needed.   Cholecalciferol (D3-1000 PO) Take 1 tablet by mouth daily.   diclofenac Sodium (VOLTAREN) 1 % GEL diclofenac 1 % topical gel  APPLY 2 GRAMS TO THE AFFECTED AREA(S) BY TOPICAL ROUTE 3 TIMES PER DAY   diltiazem (DILT-XR) 180 MG 24 hr capsule TAKE 1 CAPSULE BY MOUTH EVERY DAY   fish oil-omega-3 fatty acids 1000 MG capsule Take 1 g by mouth daily.    lisinopril (ZESTRIL) 40 MG tablet Take 1 tablet (40 mg total) by mouth 2 (two) times daily.   rosuvastatin (CRESTOR) 20 MG tablet Take 1 tablet (20 mg total) by mouth 2 (two) times a week.    trospium (SANCTURA) 20 MG tablet TAKE 1 TABLET BY MOUTH TWICE A DAY   XARELTO 20 MG TABS tablet TAKE 1 TABLET BY MOUTH EVERY DAY   No facility-administered encounter medications on file as of 04/12/2021.    Allergies (verified) Hydrochlorothiazide, Losartan, Metoprolol, and Triamterene   History: Past Medical History:  Diagnosis Date   Arrhythmia    Hypertension    Past Surgical History:  Procedure Laterality Date   TONSILLECTOMY     Family History  Problem Relation Age of Onset   Kidney disease Mother    Anemia Mother    Heart disease Father 48   Social History   Socioeconomic History   Marital status: Widowed    Spouse name: Not on file   Number of children: Not on file   Years of education: Not on file   Highest education level: Not on file  Occupational History   Not on file  Tobacco Use   Smoking status: Never   Smokeless tobacco: Never  Substance and Sexual Activity   Alcohol use: No   Drug use: No   Sexual activity: Never  Other Topics Concern   Not on file  Social History Narrative   Not on file   Social Determinants of Health   Financial Resource Strain: Low Risk    Difficulty of Paying Living Expenses: Not hard at all  Food Insecurity:  No Food Insecurity   Worried About Charity fundraiser in the Last Year: Never true   Ran Out of Food in the Last Year: Never true  Transportation Needs: No Transportation Needs   Lack of Transportation (Medical): No   Lack of Transportation (Non-Medical): No  Physical Activity: Not on file  Stress: No Stress Concern Present   Feeling of Stress : Not at all  Social Connections: Moderately Integrated   Frequency of Communication with Friends and Family: More than three times a week   Frequency of Social Gatherings with Friends and Family: More than three times a week   Attends Religious Services: More than 4 times per year   Active Member of [employer] or Organizations: Yes   Attends Archivist Meetings:  More than 4 times per year   Marital Status: Widowed    Tobacco Counseling Counseling given: Not Answered   Clinical Intake:  Pre-visit preparation completed: Yes        Diabetes: No  How often do you need to have someone help you when you read instructions, pamphlets, or other written materials from your doctor or pharmacy?: 1 - Never    Interpreter Needed?: No      Activities of Daily Living In your present state of health, do you have any difficulty performing the following activities: 04/12/2021  Hearing? N  Vision? N  Difficulty concentrating or making decisions? N  Walking or climbing stairs? N  Dressing or bathing? N  Doing errands, shopping? N  Preparing Food and eating ? N  Using the Toilet? N  In the past six months, have you accidently leaked urine? N  Do you have problems with loss of bowel control? N  Managing your Medications? N  Managing your Finances? N  Housekeeping or managing your Housekeeping? N  Some recent data might be hidden    Patient Care Team: Crecencio Mc, MD as PCP - General (Internal Medicine)  Indicate any recent Medical Services you may have received from other than [facility] providers in the past year (date may be approximate).     Assessment:   This is a routine wellness examination for Laurie [name].  Virtual Visit via Telephone Note  I connected with  [name] on 04/12/21 at  2:00 PM EST by telephone and verified that I am speaking with the correct person using two identifiers.  Location: Patient: office Provider: home Persons participating in the virtual visit: patient/Nurse Health Advisor   I discussed the limitations, risks, security and privacy concerns of performing an evaluation and management service by telephone and the availability of in person appointments. The patient expressed understanding and agreed to proceed.  Interactive audio and video telecommunications were attempted between this nurse and patient,  however failed, due to patient having technical difficulties OR patient did not have access to video capability.  We continued and completed visit with audio only.  Some vital signs may be absent or patient reported.   OBrien-Blaney, Deryl Giroux L, LPN   Hearing/Vision screen Hearing Screening - Comments:: Patient is able to hear conversational tones without difficulty.  No issues reported. Vision Screening - Comments:: Followed by Dr. Gloriann Loan Wears corrective lenses when reading Cataract extraction, bilateral  They have regular follow up with the ophthalmologist  Dietary issues and exercise activities discussed: Current Exercise Habits: Home exercise routine, Intensity: Mild Healthy diet Good water intake   Goals Addressed             This Visit's Progress  Maintain Healthy Lifestyle       Healthy diet Stay active       Depression Screen PHQ 2/9 Scores 04/12/2021 11/10/2020 07/07/2020 05/12/2020 04/11/2020 04/15/2019 01/28/2019  PHQ - 2 Score 0 0 0 0 0 0 0  PHQ- 9 Score - 1 - - - - -    Fall Risk Fall Risk  04/12/2021 11/10/2020 07/07/2020 05/12/2020 04/11/2020  Falls in the past year? 0 0 0 0 0  Number falls in past yr: 0 - 0 - 0  Injury with Fall? 0 - 0 - -  Follow up Falls evaluation completed Falls evaluation completed Falls evaluation completed Falls evaluation completed Falls evaluation completed    [place]: Adequate lighting in your home to reduce risk of falls? Yes   ASSISTIVE DEVICES UTILIZED TO PREVENT FALLS: Life alert? No  Use of a cane, walker or w/c? No  Grab bars in the bathroom? Yes  Shower chair or bench in shower? No  Elevated toilet seat or a handicapped toilet? No   TIMED UP AND GO: Was the test performed? No . Virtual visit.   Cognitive Function: Patient is alert and oriented x3.  Enjoys Bible Study groups and socializing for brain health.   MMSE - Mini Mental State Exam 01/23/2018 08/29/2016  Orientation to time 5 5   Orientation to Place 5 5  Registration 3 3  Attention/ Calculation 5 5  Recall 3 3  Language- name 2 objects 2 2  Language- repeat 1 1  Language- follow 3 step command 3 3  Language- read & follow direction 1 1  Write a sentence 1 1  Copy design 1 1  Total score 30 30     6CIT Screen 04/12/2021 01/28/2019  What Year? 0 points 0 points  What month? 0 points 0 points  What time? 0 points 0 points  Count back from 20 0 points 0 points  Months in reverse 0 points 0 points  Repeat phrase 0 points 0 points  Total Score 0 0    Immunizations Immunization History  Administered Date(s) Administered   Pneumococcal Conjugate-13 07/07/2014   Pneumococcal Polysaccharide-23 03/29/2010, 03/26/2016   Tdap 03/29/2010   Zoster, Live 03/10/2010    TDAP status: Due, Education has been provided regarding the importance of this vaccine. Advised may receive this vaccine at local pharmacy or Health Dept. Aware to provide a copy of the vaccination record if obtained from local pharmacy or Health Dept. Verbalized acceptance and understanding. Deferred.  Screening Tests Health Maintenance  Topic Date Due   Zoster Vaccines- Shingrix (1 of 2) 07/13/2021 (Originally 02/28/1981)   TETANUS/TDAP  04/12/2022 (Originally 03/29/2020)   Pneumonia Vaccine 68+ Years old  Completed   DEXA SCAN  Completed   HPV VACCINES  Aged Out   INFLUENZA VACCINE  Discontinued   MAMMOGRAM  Discontinued   COVID-19 Vaccine  Discontinued    Health Maintenance There are no preventive care reminders to display for this patient.  Lung Cancer Screening: (Low Dose CT Chest recommended if Age 51-80 years, 30 pack-year currently smoking OR have quit w/in 15years.) does not qualify.   Vision Screening: Recommended annual ophthalmology exams for early detection of glaucoma and other disorders of the eye.  Dental Screening: Recommended annual dental exams for proper oral hygiene.  Community Resource Referral / Chronic Care  Management: CRR required this visit?  No   CCM required this visit?  No      Plan:  Keep all routine maintenance appointments.   I have personally reviewed and noted the following in the patient's chart:   Medical and social history Use of alcohol, tobacco or illicit drugs  Current medications and supplements including opioid prescriptions. Not taking opioid.  Functional ability and status Nutritional status Physical activity Advanced directives List of other physicians Hospitalizations, surgeries, and ER visits in previous 12 months Vitals Screenings to include cognitive, depression, and falls Referrals and appointments  In addition, I have reviewed and discussed with patient certain preventive protocols, quality metrics, and best practice recommendations. A written personalized care plan for preventive services as well as general preventive health recommendations were provided to patient.     Ashok Pall, LPN   31/09/[id number]

## 2021-04-12 NOTE — Patient Instructions (Addendum)
Laurie [name] , Thank you for taking time to come for your Medicare Wellness Visit. I appreciate your ongoing commitment to your health goals. Please review the following plan we discussed and let me know if I can assist you in the future.   These are the goals we discussed:  Goals      Maintain Healthy Lifestyle     Healthy diet Stay active        This is a list of the screening recommended for you and due dates:  Health Maintenance  Topic Date Due   Zoster (Shingles) Vaccine (1 of 2) 07/13/2021*   Tetanus Vaccine  04/12/2022*   Pneumonia Vaccine  Completed   DEXA scan (bone density measurement)  Completed   HPV Vaccine  Aged Out   Flu Shot  Discontinued   Mammogram  Discontinued   COVID-19 Vaccine  Discontinued  *Topic was postponed. The date shown is not the original due date.    Advanced directives: End of life planning; Advance aging; Advanced directives discussed.  Copy of current HCPOA/Living Will requested.    Conditions/risks identified: none new.  Follow up in one year for your annual wellness visit    Preventive Care 65 Years and Older, Female Preventive care refers to lifestyle choices and visits with your health care provider that can promote health and wellness. What does preventive care include? A yearly physical exam. This is also called an annual well check. Dental exams once or twice a year. Routine eye exams. Ask your health care provider how often you should have your eyes checked. Personal lifestyle choices, including: Daily care of your teeth and gums. Regular physical activity. Eating a healthy diet. Avoiding tobacco and drug use. Limiting alcohol use. Practicing safe sex. Taking low-dose aspirin every day. Taking vitamin and mineral supplements as recommended by your health care provider. What happens during an annual well check? The services and screenings done by your health care provider during your annual well check will depend on your age,  overall health, lifestyle risk factors, and family history of disease. Counseling  Your health care provider may ask you questions about your: Alcohol use. Tobacco use. Drug use. Emotional well-being. Home and relationship well-being. Sexual activity. Eating habits. History of falls. Memory and ability to understand (cognition). Work and work Astronomer. Reproductive health. Screening  You may have the following tests or measurements: Height, weight, and BMI. Blood pressure. Lipid and cholesterol levels. These may be checked every 5 years, or more frequently if you are over 32 years old. Skin check. Lung cancer screening. You may have this screening every year starting at age 30 if you have a 30-pack-year history of smoking and currently smoke or have quit within the past 15 years. Fecal occult blood test (FOBT) of the stool. You may have this test every year starting at age 69. Flexible sigmoidoscopy or colonoscopy. You may have a sigmoidoscopy every 5 years or a colonoscopy every 10 years starting at age 53. Hepatitis C blood test. Hepatitis B blood test. Sexually transmitted disease (STD) testing. Diabetes screening. This is done by checking your blood sugar (glucose) after you have not eaten for a while (fasting). You may have this done every 1-3 years. Bone density scan. This is done to screen for osteoporosis. You may have this done starting at age 64. Mammogram. This may be done every 1-2 years. Talk to your health care provider about how often you should have regular mammograms. Talk with your health care provider about  your test results, treatment options, and if necessary, the need for more tests. Vaccines  Your health care provider may recommend certain vaccines, such as: Influenza vaccine. This is recommended every year. Tetanus, diphtheria, and acellular pertussis (Tdap, Td) vaccine. You may need a Td booster every 10 years. Zoster vaccine. You may need this after age  31. Pneumococcal 13-valent conjugate (PCV13) vaccine. One dose is recommended after age 45. Pneumococcal polysaccharide (PPSV23) vaccine. One dose is recommended after age 35. Talk to your health care provider about which screenings and vaccines you need and how often you need them. This information is not intended to replace advice given to you by your health care provider. Make sure you discuss any questions you have with your health care provider. Document Released: 06/17/2015 Document Revised: 02/08/2016 Document Reviewed: 03/22/2015 Elsevier Interactive Patient Education  2017 [place] Prevention in the Home Falls can cause injuries. They can happen to people of all ages. There are many things you can do to make your home safe and to help prevent falls. What can I do on the outside of my home? Regularly fix the edges of walkways and driveways and fix any cracks. Remove anything that might make you trip as you walk through a door, such as a raised step or threshold. Trim any bushes or trees on the path to your home. Use bright outdoor lighting. Clear any walking paths of anything that might make someone trip, such as rocks or tools. Regularly check to see if handrails are loose or broken. Make sure that both sides of any steps have handrails. Any raised decks and porches should have guardrails on the edges. Have any leaves, snow, or ice cleared regularly. Use sand or salt on walking paths during winter. Clean up any spills in your garage right away. This includes oil or grease spills. What can I do in the bathroom? Use night lights. Install grab bars by the toilet and in the tub and shower. Do not use towel bars as grab bars. Use non-skid mats or decals in the tub or shower. If you need to sit down in the shower, use a plastic, non-slip stool. Keep the floor dry. Clean up any water that spills on the floor as soon as it happens. Remove soap buildup in the tub or shower  regularly. Attach bath mats securely with double-sided non-slip rug tape. Do not have throw rugs and other things on the floor that can make you trip. What can I do in the bedroom? Use night lights. Make sure that you have a light by your bed that is easy to reach. Do not use any sheets or blankets that are too big for your bed. They should not hang down onto the floor. Have a firm chair that has side arms. You can use this for support while you get dressed. Do not have throw rugs and other things on the floor that can make you trip. What can I do in the kitchen? Clean up any spills right away. Avoid walking on wet floors. Keep items that you use a lot in easy-to-reach places. If you need to reach something above you, use a strong step stool that has a grab bar. Keep electrical cords out of the way. Do not use floor polish or wax that makes floors slippery. If you must use wax, use non-skid floor wax. Do not have throw rugs and other things on the floor that can make you trip. What can I do with  my stairs? Do not leave any items on the stairs. Make sure that there are handrails on both sides of the stairs and use them. Fix handrails that are broken or loose. Make sure that handrails are as long as the stairways. Check any carpeting to make sure that it is firmly attached to the stairs. Fix any carpet that is loose or worn. Avoid having throw rugs at the top or bottom of the stairs. If you do have throw rugs, attach them to the floor with carpet tape. Make sure that you have a light switch at the top of the stairs and the bottom of the stairs. If you do not have them, ask someone to add them for you. What else can I do to help prevent falls? Wear shoes that: Do not have high heels. Have rubber bottoms. Are comfortable and fit you well. Are closed at the toe. Do not wear sandals. If you use a stepladder: Make sure that it is fully opened. Do not climb a closed stepladder. Make sure that  both sides of the stepladder are locked into place. Ask someone to hold it for you, if possible. Clearly mark and make sure that you can see: Any grab bars or handrails. First and last steps. Where the edge of each step is. Use tools that help you move around (mobility aids) if they are needed. These include: Canes. Walkers. Scooters. Crutches. Turn on the lights when you go into a dark area. Replace any light bulbs as soon as they burn out. Set up your furniture so you have a clear path. Avoid moving your furniture around. If any of your floors are uneven, fix them. If there are any pets around you, be aware of where they are. Review your medicines with your doctor. Some medicines can make you feel dizzy. This can increase your chance of falling. Ask your doctor what other things that you can do to help prevent falls. This information is not intended to replace advice given to you by your health care provider. Make sure you discuss any questions you have with your health care provider. Document Released: 03/17/2009 Document Revised: 10/27/2015 Document Reviewed: 06/25/2014 Elsevier Interactive Patient Education  2017 [employer].

## 2021-04-24 DIAGNOSIS — H524 Presbyopia: Secondary | ICD-10-CM | POA: Diagnosis not present

## 2021-04-24 DIAGNOSIS — H353131 Nonexudative age-related macular degeneration, bilateral, early dry stage: Secondary | ICD-10-CM | POA: Diagnosis not present

## 2021-04-26 ENCOUNTER — Encounter: Payer: Self-pay | Admitting: Internal Medicine

## 2021-04-26 ENCOUNTER — Ambulatory Visit (INDEPENDENT_AMBULATORY_CARE_PROVIDER_SITE_OTHER): Payer: Medicare HMO | Admitting: Internal Medicine

## 2021-04-26 ENCOUNTER — Other Ambulatory Visit: Payer: Self-pay

## 2021-04-26 VITALS — BP 138/82 | HR 81 | Temp 96.4°F | Ht 64.02 in | Wt 140.4 lb

## 2021-04-26 DIAGNOSIS — I10 Essential (primary) hypertension: Secondary | ICD-10-CM

## 2021-04-26 DIAGNOSIS — D6869 Other thrombophilia: Secondary | ICD-10-CM | POA: Diagnosis not present

## 2021-04-26 DIAGNOSIS — I700 Atherosclerosis of aorta: Secondary | ICD-10-CM

## 2021-04-26 DIAGNOSIS — E782 Mixed hyperlipidemia: Secondary | ICD-10-CM | POA: Diagnosis not present

## 2021-04-26 DIAGNOSIS — I482 Chronic atrial fibrillation, unspecified: Secondary | ICD-10-CM

## 2021-04-26 DIAGNOSIS — K429 Umbilical hernia without obstruction or gangrene: Secondary | ICD-10-CM

## 2021-04-26 MED ORDER — ZOSTER VAC RECOMB ADJUVANTED 50 MCG/0.5ML IM SUSR: 0.5000 mL | Freq: Once | INTRAMUSCULAR | 1 refills | Status: AC

## 2021-04-26 MED ORDER — ROSUVASTATIN CALCIUM 20 MG PO TABS: 20.0000 mg | ORAL_TABLET | ORAL | 5 refills | Status: DC

## 2021-04-26 MED ORDER — TETANUS-DIPHTH-ACELL PERTUSSIS 5-2.5-18.5 LF-MCG/0.5 IM SUSY: 0.5000 mL | PREFILLED_SYRINGE | Freq: Once | INTRAMUSCULAR | 0 refills | Status: AC

## 2021-04-26 NOTE — Progress Notes (Signed)
Subjective:  Patient ID: Laurie [name], female    DOB: June 16, 1930  Age: 87 y.o. MRN: [id number]  CC: The primary encounter diagnosis was Mixed hyperlipidemia. Diagnoses of Primary hypertension, Chronic atrial fibrillation (HCC), Aortic atherosclerosis ([place]), Acquired thrombophilia ([place]), and Umbilical hernia without obstruction and without gangrene were also pertinent to this visit.  HPI Laurie [name] presents for  follow up on multiple issues  Chief Complaint  Patient presents with   Follow-up   This visit occurred during the SARS-CoV-2 public health emergency.  Safety protocols were in place, including screening questions prior to the visit, additional usage of staff PPE, and extensive cleaning of exam room while observing appropriate contact time as indicated for disinfecting solutions.   OAB:  she has been using [place] once daily in the evening , less nocturia   Incidental finding of umbilical hernia : Reviewed umbilical hernia found in July by Dr Caryl Bis.  She deferred treatment at that time and continues to deny symptoms or signs of incarceration   HTN:  Patient is taking her medications as prescribed and notes no adverse effects.  Home BP readings have not been done . She is avoiding added salt in her diet and walks daily for exercise  .   Grief:  she is melancholy remembering the death of her daughter who died at Christmas last year.  She has adequate familsy support locally even with  all the grandchildren going to [place] this season   Aortic atherosclerosis :  she has been taking and tolerating rosuvastatin twice weekly since June,  but did not return for hepatic panel   Atrial fibrillation :  chronic,  anticoagulated ,  she denies dyspnea .  In atrial fib   Outpatient Medications Prior to Visit  Medication Sig Dispense Refill   chlorpheniramine (CHLOR-TRIMETON) 4 MG tablet Take 4 mg by mouth 2 (two) times daily as needed.     Cholecalciferol (D3-1000 PO) Take 1  tablet by mouth daily.     diclofenac Sodium (VOLTAREN) 1 % GEL diclofenac 1 % topical gel  APPLY 2 GRAMS TO THE AFFECTED AREA(S) BY TOPICAL ROUTE 3 TIMES PER DAY     diltiazem (DILT-XR) 180 MG 24 hr capsule TAKE 1 CAPSULE BY MOUTH EVERY DAY 90 capsule 1   fish oil-omega-3 fatty acids 1000 MG capsule Take 1 g by mouth daily.      lisinopril (ZESTRIL) 40 MG tablet Take 1 tablet (40 mg total) by mouth 2 (two) times daily. 180 tablet 0   traMADol (ULTRAM) 50 MG tablet Take 50 mg by mouth every 6 (six) hours as needed.     trospium (SANCTURA) 20 MG tablet TAKE 1 TABLET BY MOUTH TWICE A DAY 180 tablet 1   XARELTO 20 MG TABS tablet TAKE 1 TABLET BY MOUTH EVERY DAY 90 tablet 1   rosuvastatin (CRESTOR) 20 MG tablet Take 1 tablet (20 mg total) by mouth 2 (two) times a week. 30 tablet 5   No facility-administered medications prior to visit.    Review of Systems;  Patient denies headache, fevers, malaise, unintentional weight loss, skin rash, eye pain, sinus congestion and sinus pain, sore throat, dysphagia,  hemoptysis , cough, dyspnea, wheezing, chest pain, palpitations, orthopnea, edema, abdominal pain, nausea, melena, diarrhea, constipation, flank pain, dysuria, hematuria, urinary  Frequency, nocturia, numbness, tingling, seizures,  Focal weakness, Loss of consciousness,  Tremor, insomnia, depression, anxiety, and suicidal ideation.      Objective:  BP 138/82   Pulse 81  Temp (!) 96.4 F (35.8 C)   Ht 5' 4.02" (1.626 m)   Wt 140 lb 6.4 oz (63.7 kg)   SpO2 97%   BMI 24.09 kg/m   BP Readings from Last 3 Encounters:  04/26/21 138/82  03/06/21 100/79  02/28/21 (!) 152/70    Wt Readings from Last 3 Encounters:  04/26/21 140 lb 6.4 oz (63.7 kg)  04/12/21 132 lb (59.9 kg)  03/06/21 132 lb (59.9 kg)    General appearance: alert, cooperative and appears stated age Ears: normal TM's and external ear canals both ears Throat: lips, mucosa, and tongue normal; teeth and gums normal Neck: no  adenopathy, no carotid bruit, supple, symmetrical, trachea midline and thyroid not enlarged, symmetric, no tenderness/mass/nodules Back: symmetric, no curvature. ROM normal. No CVA tenderness. Lungs: clear to auscultation bilaterally Heart: regular rate and rhythm, S1, S2 normal, no murmur, click, rub or gallop Abdomen: soft, non-tender; bowel sounds normal; no masses,  no organomegaly Pulses: 2+ and symmetric Skin: Skin color, texture, turgor normal. No rashes or lesions Lymph nodes: Cervical, supraclavicular, and axillary nodes normal.  Lab Results  Component Value Date   HGBA1C 6.0 05/10/2020   HGBA1C 6.2 11/11/2019   HGBA1C 6.0 06/09/2019    Lab Results  Component Value Date   CREATININE 0.91 04/26/2021   CREATININE 0.86 11/10/2020   CREATININE 0.63 06/27/2020    Lab Results  Component Value Date   WBC 5.5 11/10/2020   HGB 14.5 11/10/2020   HCT 43.7 11/10/2020   PLT 203.0 11/10/2020   GLUCOSE 91 04/26/2021   CHOL 251 (H) 11/10/2020   TRIG 150.0 (H) 11/10/2020   HDL 58.80 11/10/2020   LDLDIRECT 148.0 01/29/2018   LDLCALC 162 (H) 11/10/2020   ALT 16 04/26/2021   AST 19 04/26/2021   NA 140 04/26/2021   K 4.3 04/26/2021   CL 102 04/26/2021   CREATININE 0.91 04/26/2021   BUN 22 04/26/2021   CO2 22 04/26/2021   TSH 2.77 01/29/2018   INR 1.0 11/16/2011   HGBA1C 6.0 05/10/2020   MICROALBUR 0.8 04/16/2017    Ultrasound renal complete  Result Date: 01/30/2021 CLINICAL DATA:  Microhematuria. EXAM: RENAL / URINARY TRACT ULTRASOUND COMPLETE COMPARISON:  None. FINDINGS: Right Kidney: Renal measurements: 10.5 cm x 4.7 cm x 4.5 cm = volume: 115.7 mL. Echogenicity within normal limits. A 3.3 cm x 3.0 cm x 2.8 cm anechoic structure is seen along the lower pole of the right kidney. No abnormal flow is seen within this region on color Doppler evaluation. No hydronephrosis is visualized. Left Kidney: Renal measurements: 10.2 cm x 4.7 cm x 4.6 cm = volume: 131.0 mL. Echogenicity  within normal limits. No mass or hydronephrosis visualized. Bladder: Appears normal for degree of bladder distention. Other: None. IMPRESSION: Simple right renal cyst, likely benign. Electronically Signed   By: Aram Candela M.D.   On: 01/30/2021 21:12    Assessment & Plan:   Problem List Items Addressed This Visit     Hypertension    Well controlled for age on lisinopril 40 mg bid. .  Renal function stable, no changes today.  Lab Results  Component Value Date   CREATININE 0.91 04/26/2021   Lab Results  Component Value Date   NA 140 04/26/2021   K 4.3 04/26/2021   CL 102 04/26/2021   CO2 22 04/26/2021         Relevant Medications   rosuvastatin (CRESTOR) 20 MG tablet   Chronic atrial fibrillation (HCC)    She  remains rate controlled on Diltiazem CR and anticoagulated with Xarelto.  She is asymptomatic with activities       Relevant Medications   rosuvastatin (CRESTOR) 20 MG tablet   Hyperlipidemia - Primary    Untreated secondary to age and statin intolerance, but she is now tolerating crestor 2 /weekly since mid June given the finding of AA   Lab Results  Component Value Date   CHOL 251 (H) 11/10/2020   HDL 58.80 11/10/2020   LDLCALC 162 (H) 11/10/2020   LDLDIRECT 148.0 01/29/2018   TRIG 150.0 (H) 11/10/2020   CHOLHDL 4 11/10/2020         Relevant Medications   rosuvastatin (CRESTOR) 20 MG tablet   Other Relevant Orders   Comprehensive metabolic panel (Completed)   Acquired thrombophilia ([place])    Secondary to chronic atrial fibrillation.  She is taking xarelto without bleeding or bruising .  Cbc is normal   Lab Results  Component Value Date   WBC 5.5 11/10/2020   HGB 14.5 11/10/2020   HCT 43.7 11/10/2020   MCV 90.7 11/10/2020   PLT 203.0 11/10/2020          Aortic atherosclerosis ([place])    Now treated with twice weekly rosuvstatin stared I nJue.  LFTS are normal.  Lab Results  Component Value Date   ALT 16 04/26/2021   AST 19 04/26/2021    ALKPHOS 61 11/10/2020   BILITOT 0.7 04/26/2021         Relevant Medications   rosuvastatin (CRESTOR) 20 MG tablet   Umbilical hernia    Fully reducible with no pain. Reviewed the option of monitoring versus seeing a surgeon.  Patient contniues to  defer treatment .  Reminded that if she has significant discomfort or if the hernia protrudes and is not reducible she will be evaluated in the emergency department right away.       I am having Adyn M. Maners start on Tdap and Zoster Vaccine Adjuvanted. I am also having her maintain her fish oil-omega-3 fatty acids, chlorpheniramine, Cholecalciferol (D3-1000 PO), diltiazem, trospium, Xarelto, diclofenac Sodium, lisinopril, traMADol, and rosuvastatin.  Meds ordered this encounter  Medications   rosuvastatin (CRESTOR) 20 MG tablet    Sig: Take 1 tablet (20 mg total) by mouth 2 (two) times a week.    Dispense:  30 tablet    Refill:  5    KEEP ON FILE FOR FUTURE REFILLS   Tdap (BOOSTRIX) 5-2.5-18.5 LF-MCG/0.5 injection    Sig: Inject 0.5 mLs into the muscle once for 1 dose.    Dispense:  0.5 mL    Refill:  0   Zoster Vaccine Adjuvanted [facility]) injection    Sig: Inject 0.5 mLs into the muscle once for 1 dose.    Dispense:  1 each    Refill:  1    Medications Discontinued During This Encounter  Medication Reason   rosuvastatin (CRESTOR) 20 MG tablet Reorder    Follow-up: No follow-ups on file.   Crecencio Mc, MD

## 2021-04-26 NOTE — Patient Instructions (Signed)
  The Tdap and Shingrix vaccines will be COVERED BY MEDICARE if you get them at your pharmacy after January 1

## 2021-04-27 LAB — COMPREHENSIVE METABOLIC PANEL
AG Ratio: 1.9 (calc) (ref 1.0–2.5)
ALT: 16 U/L (ref 6–29)
AST: 19 U/L (ref 10–35)
Albumin: 4.2 g/dL (ref 3.6–5.1)
Alkaline phosphatase (APISO): 58 U/L (ref 37–153)
BUN: 22 mg/dL (ref 7–25)
CO2: 22 mmol/L (ref 20–32)
Calcium: 9.7 mg/dL (ref 8.6–10.4)
Chloride: 102 mmol/L (ref 98–110)
Creat: 0.91 mg/dL (ref 0.60–0.95)
Globulin: 2.2 g/dL (calc) (ref 1.9–3.7)
Glucose, Bld: 91 mg/dL (ref 65–99)
Potassium: 4.3 mmol/L (ref 3.5–5.3)
Sodium: 140 mmol/L (ref 135–146)
Total Bilirubin: 0.7 mg/dL (ref 0.2–1.2)
Total Protein: 6.4 g/dL (ref 6.1–8.1)

## 2021-04-27 LAB — SPECIMEN COMPROMISED

## 2021-04-28 NOTE — Assessment & Plan Note (Signed)
Now treated with twice weekly rosuvstatin stared I nJue.  LFTS are normal.  Lab Results  Component Value Date   ALT 16 04/26/2021   AST 19 04/26/2021   ALKPHOS 61 11/10/2020   BILITOT 0.7 04/26/2021

## 2021-04-28 NOTE — Assessment & Plan Note (Signed)
Untreated secondary to age and statin intolerance, but she is now tolerating crestor 2 /weekly since mid June given the finding of AA   Lab Results  Component Value Date   CHOL 251 (H) 11/10/2020   HDL 58.80 11/10/2020   LDLCALC 162 (H) 11/10/2020   LDLDIRECT 148.0 01/29/2018   TRIG 150.0 (H) 11/10/2020   CHOLHDL 4 11/10/2020

## 2021-04-28 NOTE — Assessment & Plan Note (Signed)
Fully reducible with no pain. Reviewed the option of monitoring versus seeing a surgeon.  Patient contniues to  defer treatment .  Reminded that if she has significant discomfort or if the hernia protrudes and is not reducible she will be evaluated in the emergency department right away.

## 2021-04-28 NOTE — Assessment & Plan Note (Signed)
Well controlled for age on lisinopril 40 mg bid. .  Renal function stable, no changes today.  Lab Results  Component Value Date   CREATININE 0.91 04/26/2021   Lab Results  Component Value Date   NA 140 04/26/2021   K 4.3 04/26/2021   CL 102 04/26/2021   CO2 22 04/26/2021

## 2021-04-28 NOTE — Assessment & Plan Note (Signed)
She remains rate controlled on Diltiazem CR and anticoagulated with Xarelto.  She is asymptomatic with activities  

## 2021-04-28 NOTE — Assessment & Plan Note (Signed)
Secondary to chronic atrial fibrillation.  She is taking xarelto without bleeding or bruising .  Cbc is normal   Lab Results  Component Value Date   WBC 5.5 11/10/2020   HGB 14.5 11/10/2020   HCT 43.7 11/10/2020   MCV 90.7 11/10/2020   PLT 203.0 11/10/2020     

## 2021-05-05 ENCOUNTER — Encounter: Payer: Self-pay | Admitting: *Deleted

## 2021-05-06 ENCOUNTER — Other Ambulatory Visit: Payer: Self-pay | Admitting: Internal Medicine

## 2021-05-15 ENCOUNTER — Ambulatory Visit: Payer: Medicare HMO | Admitting: Internal Medicine

## 2021-05-31 ENCOUNTER — Other Ambulatory Visit: Payer: Self-pay | Admitting: Cardiovascular Disease

## 2021-05-31 NOTE — Telephone Encounter (Signed)
Refill Request.  

## 2021-05-31 NOTE — Telephone Encounter (Signed)
Prescription refill request for Xarelto received.  Indication: Atrial fib Last office visit: 02/28/21  Terrilee Files MD Weight: 63.2kg Age: 87 Scr: 0.91 on 04/26/21 CrCl: 42.63  Based on above findings Xarelto 15mg  daily (Renal) would be the appropriate dose.  Will forward to Dr for advise.

## 2021-06-02 MED ORDER — RIVAROXABAN 15 MG PO TABS: 15.0000 mg | ORAL_TABLET | Freq: Every day | ORAL | 5 refills | Status: DC

## 2021-06-02 NOTE — Addendum Note (Signed)
Addended by: Cory Roughen on: 06/02/2021 05:38 PM   Modules accepted: Orders

## 2021-06-02 NOTE — Telephone Encounter (Addendum)
Attempted to call pt and make her aware of dose change. Called number on DPR [phone] no answer.   Pt's son on Merlinda Frederick, Brett Canales, called and spoke to him making him aware that pt's Xarelto dose was being decreased to Xarelto 15mg  tabs- pt is to take 1 tablet by mouth daily with supper. Pt's son verbalized understanding and stated he would update the patient.

## 2021-06-02 NOTE — Telephone Encounter (Signed)
°  Indication: afib  Last office visit: arida, 02/28/2021 Weight: 63.7 kg  Age: 87 yo  Scr:  0.91, 04/26/2021 CrCl: 43 ml/min   Refill sent for Xarelto 15mg  daily.

## 2021-06-05 ENCOUNTER — Other Ambulatory Visit: Payer: Self-pay | Admitting: Cardiovascular Disease

## 2021-06-06 ENCOUNTER — Other Ambulatory Visit (INDEPENDENT_AMBULATORY_CARE_PROVIDER_SITE_OTHER): Payer: Medicare HMO

## 2021-06-06 ENCOUNTER — Other Ambulatory Visit: Payer: Self-pay

## 2021-06-06 ENCOUNTER — Telehealth: Payer: Self-pay | Admitting: Cardiovascular Disease

## 2021-06-06 DIAGNOSIS — R300 Dysuria: Secondary | ICD-10-CM | POA: Diagnosis not present

## 2021-06-06 NOTE — Telephone Encounter (Signed)
Refill request

## 2021-06-06 NOTE — Telephone Encounter (Signed)
*  STAT* If patient is at the pharmacy, call can be transferred to refill team.   1. Which medications need to be refilled? (please list name of each medication and dose if known) Xarelto  2. Which pharmacy/location (including street and city if local pharmacy) is medication to be sent to? [facility]  3. Do they need a 30 day or 90 day supply? 90   

## 2021-06-06 NOTE — Telephone Encounter (Signed)
Xarelto 15  mg refill request received. Pt is 88 years old, weight- 63.7 kg, Crea- 0.91 on 04/26/21, last seen by Dr. Kirke Corin on 02/28/21, Diagnosis-afob, CrCl- 50.56; Dose is appropriate based on dosing criteria. Will send in refill to requested pharmacy.

## 2021-06-06 NOTE — Telephone Encounter (Signed)
Refill already sent in this morning.

## 2021-06-06 NOTE — Telephone Encounter (Signed)
Labs have been ordered for lab appt this afternoon  

## 2021-06-07 ENCOUNTER — Telehealth: Payer: Medicare HMO | Admitting: Adult Health

## 2021-06-07 LAB — URINALYSIS, ROUTINE W REFLEX MICROSCOPIC
Bilirubin Urine: NEGATIVE
Ketones, ur: NEGATIVE
Leukocytes,Ua: NEGATIVE
Nitrite: NEGATIVE
Specific Gravity, Urine: 1.02 (ref 1.000–1.030)
Total Protein, Urine: NEGATIVE
Urine Glucose: NEGATIVE
Urobilinogen, UA: 0.2 (ref 0.0–1.0)
pH: 6 (ref 5.0–8.0)

## 2021-06-08 ENCOUNTER — Other Ambulatory Visit: Payer: Self-pay | Admitting: Internal Medicine

## 2021-06-08 ENCOUNTER — Ambulatory Visit: Payer: Medicare HMO | Admitting: Adult Health

## 2021-06-08 DIAGNOSIS — N390 Urinary tract infection, site not specified: Secondary | ICD-10-CM | POA: Insufficient documentation

## 2021-06-08 DIAGNOSIS — N3000 Acute cystitis without hematuria: Secondary | ICD-10-CM

## 2021-06-08 LAB — URINE CULTURE
MICRO NUMBER:: 12820662
SPECIMEN QUALITY:: ADEQUATE

## 2021-06-08 MED ORDER — CIPROFLOXACIN HCL 250 MG PO TABS: 250.0000 mg | ORAL_TABLET | Freq: Two times a day (BID) | ORAL | 0 refills | Status: AC

## 2021-06-08 NOTE — Assessment & Plan Note (Signed)
E Coli sensitive to cipro  

## 2021-06-09 ENCOUNTER — Ambulatory Visit (INDEPENDENT_AMBULATORY_CARE_PROVIDER_SITE_OTHER): Payer: Medicare HMO | Admitting: Internal Medicine

## 2021-06-09 ENCOUNTER — Encounter: Payer: Self-pay | Admitting: Internal Medicine

## 2021-06-09 ENCOUNTER — Telehealth: Payer: Medicare HMO | Admitting: Internal Medicine

## 2021-06-09 DIAGNOSIS — N3000 Acute cystitis without hematuria: Secondary | ICD-10-CM

## 2021-06-09 NOTE — Assessment & Plan Note (Signed)
E Coli UTI sensitive to cipro,  Treating for 5 days

## 2021-06-09 NOTE — Progress Notes (Addendum)
Telephone  Note  This visit type was conducted due to national recommendations for restrictions regarding the COVID-19 pandemic (e.g. social distancing).  This format is felt to be most appropriate for this patient at this time.  All issues noted in this document were discussed and addressed.  No physical exam was performed (except for noted visual exam findings with Video Visits).   I connected withNAME@ on 06/09/21 at  2:30 PM EST by telephone and verified that I am speaking with the correct person using two identifiers. Location patient: home Location provider: work or home office Persons participating in the virtual visit: patient, provider  I discussed the limitations, risks, security and privacy concerns of performing an evaluation and management service by telephone and the availability of in person appointments. I also discussed with the patient that there may be a patient responsible charge related to this service. The patient expressed understanding and agreed to proceed.  Reason for visit: dysuria,  frequency and urgency since Dec Laurie   HPI:  88 yr old [name] presents for treatment of UTI>    Symptoms of dysuria and frequency started on Dec Laurie th.  She tried increasing her water intake but symptoms persistent and she contacted the office on January 2,  when the office was closed.  She was eventually  called back by a  "triage nurse" at 2:15  and advised to go to either call back when the office was open or to seek treatment at an Urgent Care if she could not wait. She went to Urgent care and waited for over an hour  before leaving without being seen.  Called office on jan 3 and was advised to submit a urine sample.  Urine sample has finalized and confirmed UTI with E Coli.  She has not started the ciprofloxacin called in last night and continues to have symptoms of dysuria and frequency.  She denies CVA, fever, nausea and hematuria    ROS: See pertinent positives and negatives per  HPI.  Past Medical History:  Diagnosis Date   Arrhythmia    Hypertension     Past Surgical History:  Procedure Laterality Date   TONSILLECTOMY      Family History  Problem Relation Age of Onset   Kidney disease Mother    Anemia Mother    Heart disease Father 4    SOCIAL HX: reports that she has never smoked. She has never used smokeless tobacco. She reports that she does not drink alcohol and does not use drugs.    Current Outpatient Medications:    chlorpheniramine (CHLOR-TRIMETON) 4 MG tablet, Take 4 mg by mouth 2 (two) times daily as needed., Disp: , Rfl:    Cholecalciferol (D3-1000 PO), Take 1 tablet by mouth daily., Disp: , Rfl:    ciprofloxacin (CIPRO) 250 MG tablet, Take 1 tablet (250 mg total) by mouth 2 (two) times daily for 5 days., Disp: 10 tablet, Rfl: 0   diclofenac Sodium (VOLTAREN) 1 % GEL, diclofenac 1 % topical gel  APPLY 2 GRAMS TO THE AFFECTED AREA(S) BY TOPICAL ROUTE 3 TIMES PER DAY, Disp: , Rfl:    diltiazem (DILT-XR) 180 MG 24 hr capsule, TAKE 1 CAPSULE BY MOUTH EVERY DAY, Disp: 90 capsule, Rfl: 1   fish oil-omega-3 fatty acids 1000 MG capsule, Take 1 g by mouth daily. , Disp: , Rfl:    lisinopril (ZESTRIL) 40 MG tablet, Take 1 tablet (40 mg total) by mouth 2 (two) times daily., Disp: 180 tablet, Rfl: 0  Rivaroxaban (XARELTO) 15 MG TABS tablet, Take 1 tablet (15 mg total) by mouth daily with supper., Disp: 30 tablet, Rfl: 5   rosuvastatin (CRESTOR) 20 MG tablet, Take 1 tablet (20 mg total) by mouth 2 (two) times a week., Disp: 30 tablet, Rfl: 5   traMADol (ULTRAM) 50 MG tablet, Take 50 mg by mouth every 6 (six) hours as needed., Disp: , Rfl:    trospium (SANCTURA) 20 MG tablet, TAKE 1 TABLET BY MOUTH TWICE A DAY, Disp: 180 tablet, Rfl: 1   XARELTO 20 MG TABS tablet, TAKE 1 TABLET BY MOUTH EVERY DAY, Disp: 90 tablet, Rfl: 1  EXAM:  General impression: alert, cooperative and articulate.  No signs of being in distress  Lungs: speech is fluent sentence  length suggests that patient is not short of breath and not punctuated by cough, sneezing or sniffing. Marland Kitchen   Psych: affect normal.  speech is articulate and non pressured .  Denies suicidal thoughts   ASSESSMENT AND PLAN:  Discussed the following assessment and plan:  Acute cystitis without hematuria  UTI (urinary tract infection) E Coli UTI sensitive to cipro,  Treating for 5 days       I provided  20  minutes of non-face-to-face time during this encounter reviewing patient's current problems and recent labs ,  Providing counseling on the above mentioned problem , and coordination  of care .   I discussed the assessment and treatment plan with the patient. The patient was provided an opportunity to ask questions and all were answered. The patient agreed with the plan and demonstrated an understanding of the instructions.   The patient was advised to call back or seek an in-person evaluation if the symptoms worsen or if the condition fails to improve as anticipated.    Crecencio Mc, MD

## 2021-09-01 DIAGNOSIS — M13862 Other specified arthritis, left knee: Secondary | ICD-10-CM | POA: Diagnosis not present

## 2021-09-02 ENCOUNTER — Other Ambulatory Visit: Payer: Self-pay | Admitting: Internal Medicine

## 2021-09-04 NOTE — Progress Notes (Signed)
? ? ?09/05/2021 ?2:26 PM  ? ?Laurie [name] ?[birth year] ?[id number] ? ?Referring provider: Crecencio Mc, MD ?[address] ?[address] ?[place],  [place] [address] ? ?Chief Complaint  ?Patient presents with  ? Hematuria  ? Over Active Bladder  ? ?Urological history: ?1. High risk hematuria ?-non-smoker ?-RUS 2022 - simple right renal cyst ?-cysto 2022 - NED ?-no reports of gross heme ?-UA negative for micro heme 06/2021  ? ?2. OAB ?-Contributing factors of age, vaginal atrophy and HTN ?-PVR 205 mL ?-Managed with trospium 20 mg qhs  ? ? ?HPI: ?Laurie [name] is a 88 y.o. female who presents today for a 6 month follow up. ? ?She is having 1-7 daytime urinations, 3 or more nighttime urinations with a mild urge to urinate.  She experiences stress urinary incontinence.  She is leaking 1-2 times weekly.  She wears a panty liner daily.  She does engage in toilet mapping and fluid restrictions. ? ?Patient denies any modifying or aggravating factors.  Patient denies any gross hematuria, dysuria or suprapubic/flank pain.  Patient denies any fevers, chills, nausea or vomiting.   ? ?UA benign ? ?PVR 205 mL   ? ?PMH: ?Past Medical History:  ?Diagnosis Date  ? Arrhythmia   ? Hypertension   ? ? ?Surgical History: ?Past Surgical History:  ?Procedure Laterality Date  ? TONSILLECTOMY    ? ? ?Home Medications:  ?Allergies as of 09/05/2021   ? ?   Reactions  ? Hydrochlorothiazide   ? Losartan   ? angioedema  ? Metoprolol   ? Triamterene   ? ?  ? ?  ?Medication List  ?  ? ?  ? Accurate as of September 05, 2021 11:59 PM. If you have any questions, ask your nurse or doctor.  ?  ?  ? ?  ? ?chlorpheniramine 4 MG tablet ?Commonly known as: CHLOR-TRIMETON ?Take 4 mg by mouth 2 (two) times daily as needed. ?  ?D3-1000 PO ?Take 1 tablet by mouth daily. ?  ?diclofenac Sodium 1 % Gel ?Commonly known as: VOLTAREN ?diclofenac 1 % topical gel ? APPLY 2 GRAMS TO THE AFFECTED AREA(S) BY TOPICAL ROUTE 3 TIMES PER DAY ?  ?diltiazem 180 MG 24 hr  capsule ?Commonly known as: Dilt-XR ?TAKE 1 CAPSULE BY MOUTH EVERY DAY ?  ?fish oil-omega-3 fatty acids 1000 MG capsule ?Take 1 g by mouth daily. ?  ?lisinopril 40 MG tablet ?Commonly known as: ZESTRIL ?TAKE 1 TABLET BY MOUTH TWICE A DAY ?  ?Rivaroxaban 15 MG Tabs tablet ?Commonly known as: XARELTO ?Take 1 tablet (15 mg total) by mouth daily with supper. ?  ?Xarelto 20 MG Tabs tablet ?Generic drug: rivaroxaban ?TAKE 1 TABLET BY MOUTH EVERY DAY ?  ?rosuvastatin 20 MG tablet ?Commonly known as: CRESTOR ?Take 1 tablet (20 mg total) by mouth 2 (two) times a week. ?  ?traMADol 50 MG tablet ?Commonly known as: ULTRAM ?Take 50 mg by mouth every 6 (six) hours as needed. ?  ?trospium 20 MG tablet ?Commonly known as: SANCTURA ?TAKE 1 TABLET BY MOUTH TWICE A DAY ?  ? ?  ? ? ?Allergies:  ?Allergies  ?Allergen Reactions  ? Hydrochlorothiazide   ? Losartan   ?  angioedema  ? Metoprolol   ? Triamterene   ? ? ?Family History: ?Family History  ?Problem Relation Age of Onset  ? Kidney disease Mother   ? Anemia Mother   ? Heart disease Father 76  ? ? ?Social History:  reports that she  has never smoked. She has never used smokeless tobacco. She reports that she does not drink alcohol and does not use drugs. ? ?ROS: ?Pertinent ROS in HPI ? ?Physical Exam: ?BP (!) 179/80   Pulse 80   Ht 5\' 4"  (1.626 m)   Wt 141 lb (64 kg)   BMI 24.20 kg/m?   ?Constitutional:  Well nourished. Alert and oriented, No acute distress. ?HEENT: [place] AT, moist mucus membranes.  Trachea midline, no masses. ?Cardiovascular: No clubbing, cyanosis, or edema. ?Respiratory: Normal respiratory effort, no increased work of breathing. ?Neurologic: Grossly intact, no focal deficits, moving all 4 extremities. ?Psychiatric: Normal mood and affect.   ? ?Laboratory Data: ? ?  Latest Ref Rng & Units 04/26/2021  ?  4:40 PM 11/10/2020  ? 10:00 AM 06/27/2020  ?  6:56 PM  ?CMP  ?Glucose 65 - 99 mg/dL 91   103   110    ?BUN 7 - 25 mg/dL 22   16   12     ?Creatinine 0.60 - 0.95 mg/dL  0.91   0.86   0.63    ?Sodium 135 - 146 mmol/L 140   140   139    ?Potassium 3.5 - 5.3 mmol/L 4.3   4.6   3.8    ?Chloride 98 - 110 mmol/L 102   104   101    ?CO2 20 - 32 mmol/L 22   27   25     ?Calcium 8.6 - 10.4 mg/dL 9.7   10.3   9.3    ?Total Protein 6.1 - 8.1 g/dL 6.4   7.1     ?Total Bilirubin 0.2 - 1.2 mg/dL 0.7   0.9     ?Alkaline Phos 39 - 117 U/L  61     ?AST 10 - 35 U/L 19   18     ?ALT 6 - 29 U/L 16   13     ?  ? ?  Latest Ref Rng & Units 11/10/2020  ? 10:00 AM 06/27/2020  ?  6:56 PM 05/12/2020  ?  9:46 AM  ?CBC  ?WBC 4.0 - 10.5 K/uL 5.5   7.0   5.1    ?Hemoglobin 12.0 - 15.0 g/dL 14.5   12.9   13.6    ?Hematocrit 36.0 - 46.0 % 43.7   39.1   41.6    ?Platelets 150.0 - 400.0 K/uL 203.0   440   184.0    ?  ?Urinalysis ?Component ?    Latest Ref Rng 09/05/2021  ?Color, UA ?    Yellow  Yellow   ?Bilirubin, UA ?    Negative  Negative   ?Ketones, UA ?    Negative  Negative   ?Specific Gravity, UA ?    1.005 - 1.030  1.010   ?RBC, UA ?    Negative  Trace !   ?pH, UA ?    5.0 - 7.5  7.0   ?Protein,UA ?    Negative/Trace  Negative   ?Nitrite, UA ?    Negative  Negative   ?Leukocytes,UA ?    Negative  Negative   ?Appearance Ur ?    Clear  Clear   ?Glucose, UA ?    Negative  Negative   ?Urobilinogen, Ur ?    0.2 - 1.0 mg/dL 0.2   ?Microscopic Examination See below:   ? ?Component ?    Latest Ref Rng 09/05/2021  ?RBC ?    0 - 2 /hpf None seen   ?  WBC, UA ?    0 - 5 /hpf 0-5   ?Epithelial Cells (non renal) ?    0 - 10 /hpf 0-10   ?Bacteria, UA ?    None seen/Few  None seen   ?I have reviewed the labs. ? ? ?Pertinent Imaging: ? 09/05/21 11:22  ?Scan Result 284mL  ? ? ?Assessment & Plan:   ? ?1. High risk hematuria ?-no reports of gross heme ?-no micro heme on recent UA's ?-continue to follow ?-pursue CTU if hematuria worsens ? ?2. OAB ?-adequate bladder emptying ?-at goal on trospium 20 mg qhs ?-continue trospium  ? ? ?Return in about 6 months (around 03/07/2022) for UA, PVR and OAB questionnaire. ? ?These notes generated with  voice recognition software. I apologize for typographical errors. ? ?Valla Pacey, PA-C ? ?[place] ?[place], [place] [address] ?[phone]  ?

## 2021-09-05 ENCOUNTER — Ambulatory Visit: Payer: Medicare HMO | Admitting: Urology

## 2021-09-05 VITALS — BP 179/80 | HR 80 | Ht 64.0 in | Wt 141.0 lb

## 2021-09-05 DIAGNOSIS — R319 Hematuria, unspecified: Secondary | ICD-10-CM

## 2021-09-05 DIAGNOSIS — N3281 Overactive bladder: Secondary | ICD-10-CM

## 2021-09-05 LAB — URINALYSIS, COMPLETE
Bilirubin, UA: NEGATIVE
Glucose, UA: NEGATIVE
Ketones, UA: NEGATIVE
Leukocytes,UA: NEGATIVE
Nitrite, UA: NEGATIVE
Protein,UA: NEGATIVE
Specific Gravity, UA: 1.01 (ref 1.005–1.030)
Urobilinogen, Ur: 0.2 mg/dL (ref 0.2–1.0)
pH, UA: 7 (ref 5.0–7.5)

## 2021-09-05 LAB — MICROSCOPIC EXAMINATION
Bacteria, UA: NONE SEEN
RBC, Urine: NONE SEEN /hpf (ref 0–2)

## 2021-09-05 LAB — BLADDER SCAN AMB NON-IMAGING

## 2021-09-06 DIAGNOSIS — M25562 Pain in left knee: Secondary | ICD-10-CM | POA: Diagnosis not present

## 2021-09-06 DIAGNOSIS — M1712 Unilateral primary osteoarthritis, left knee: Secondary | ICD-10-CM | POA: Diagnosis not present

## 2021-09-12 ENCOUNTER — Encounter: Payer: Self-pay | Admitting: Urology

## 2021-09-12 DIAGNOSIS — M25562 Pain in left knee: Secondary | ICD-10-CM | POA: Diagnosis not present

## 2021-09-12 DIAGNOSIS — M1712 Unilateral primary osteoarthritis, left knee: Secondary | ICD-10-CM | POA: Diagnosis not present

## 2021-09-25 ENCOUNTER — Other Ambulatory Visit: Payer: Self-pay | Admitting: Cardiovascular Disease

## 2021-09-25 ENCOUNTER — Other Ambulatory Visit: Payer: Self-pay | Admitting: Internal Medicine

## 2021-09-25 NOTE — Telephone Encounter (Signed)
Refill Request.  

## 2021-09-26 MED ORDER — RIVAROXABAN 15 MG PO TABS: 15.0000 mg | ORAL_TABLET | Freq: Every day | ORAL | 5 refills | Status: DC

## 2021-09-26 NOTE — Telephone Encounter (Signed)
Refilled: 06/08/2021 ?Last OV: 06/09/2021 ?Next OV: not scheduled ?

## 2021-09-26 NOTE — Telephone Encounter (Addendum)
Prescription refill request for Xarelto received.  ?Indication: Atrial Fib ?Last office visit: 02/28/22  Terrilee Files MD ?Weight: 63.2kg ?Age: 88 ?Scr: 0.91 on 04/26/21 ?CrCl: 42.63 ? ?Based on above findings Xarelto 15mg  daily would be the appropriate dose.  Will send to Dr for his decision on dose reduction. ? ?Message returned from Dr Kirke Corin.  OK to decrease dose to 15mg  daily.  New Rx sent to pharmacy and pt notified. ? ?(Dose should have been reduced 12/22 per chart but pharmacy still requesting Xarelto 20mg .  Called pt and verified she has 20mg  tablet.  She will finish pills in hand and then start 15mg  daily) ?

## 2021-10-11 DIAGNOSIS — M25562 Pain in left knee: Secondary | ICD-10-CM | POA: Diagnosis not present

## 2021-10-11 DIAGNOSIS — M1712 Unilateral primary osteoarthritis, left knee: Secondary | ICD-10-CM | POA: Diagnosis not present

## 2021-10-18 DIAGNOSIS — M1712 Unilateral primary osteoarthritis, left knee: Secondary | ICD-10-CM | POA: Diagnosis not present

## 2021-10-18 DIAGNOSIS — M25562 Pain in left knee: Secondary | ICD-10-CM | POA: Diagnosis not present

## 2021-11-02 DIAGNOSIS — M1712 Unilateral primary osteoarthritis, left knee: Secondary | ICD-10-CM | POA: Diagnosis not present

## 2021-11-02 DIAGNOSIS — M25562 Pain in left knee: Secondary | ICD-10-CM | POA: Diagnosis not present

## 2021-11-09 DIAGNOSIS — M25562 Pain in left knee: Secondary | ICD-10-CM | POA: Diagnosis not present

## 2021-11-09 DIAGNOSIS — M1712 Unilateral primary osteoarthritis, left knee: Secondary | ICD-10-CM | POA: Diagnosis not present

## 2021-11-16 DIAGNOSIS — M25562 Pain in left knee: Secondary | ICD-10-CM | POA: Diagnosis not present

## 2021-11-16 DIAGNOSIS — M1712 Unilateral primary osteoarthritis, left knee: Secondary | ICD-10-CM | POA: Diagnosis not present

## 2021-11-20 ENCOUNTER — Encounter: Payer: Self-pay | Admitting: Internal Medicine

## 2021-11-20 ENCOUNTER — Ambulatory Visit (INDEPENDENT_AMBULATORY_CARE_PROVIDER_SITE_OTHER): Payer: Medicare HMO | Admitting: Internal Medicine

## 2021-11-20 VITALS — BP 144/90 | HR 70 | Temp 97.3°F | Ht 64.0 in | Wt 137.4 lb

## 2021-11-20 DIAGNOSIS — R5383 Other fatigue: Secondary | ICD-10-CM | POA: Diagnosis not present

## 2021-11-20 DIAGNOSIS — N3281 Overactive bladder: Secondary | ICD-10-CM | POA: Diagnosis not present

## 2021-11-20 DIAGNOSIS — I10 Essential (primary) hypertension: Secondary | ICD-10-CM | POA: Diagnosis not present

## 2021-11-20 DIAGNOSIS — I482 Chronic atrial fibrillation, unspecified: Secondary | ICD-10-CM

## 2021-11-20 DIAGNOSIS — R7301 Impaired fasting glucose: Secondary | ICD-10-CM | POA: Diagnosis not present

## 2021-11-20 DIAGNOSIS — E782 Mixed hyperlipidemia: Secondary | ICD-10-CM

## 2021-11-20 DIAGNOSIS — M25562 Pain in left knee: Secondary | ICD-10-CM

## 2021-11-20 DIAGNOSIS — I700 Atherosclerosis of aorta: Secondary | ICD-10-CM | POA: Diagnosis not present

## 2021-11-20 DIAGNOSIS — D6869 Other thrombophilia: Secondary | ICD-10-CM

## 2021-11-20 DIAGNOSIS — G8929 Other chronic pain: Secondary | ICD-10-CM | POA: Diagnosis not present

## 2021-11-20 MED ORDER — ZOSTER VAC RECOMB ADJUVANTED 50 MCG/0.5ML IM SUSR: 0.5000 mL | Freq: Once | INTRAMUSCULAR | 1 refills | Status: AC

## 2021-11-20 NOTE — Patient Instructions (Addendum)
TO MANAGE YOUR KNEE PAIN  start taking up to 2000 mg of acetominophen (tylenol) EVERY  DAY   In divided doses (500 mg every 6 hours ,  650 mg every 8 hours,   Or 1000 mg every 12 hours.)    If your pain is not controlled on this tylenol regimen  ADD ONE TRAMADOL DAILY  For your overactive bladder:   TAKE THE TROSPIUM  DOSE AT BEDTIME INSTEAD OF AT DINNERTIME   LIMIT YOUR INTAKE OF LIQUIDS AFTER 7 PM TO A FEW SIPS FOR YOUR MEDICATIONS   ASK THE PHYSICAL THERAPIST IF THEY WORK WITH MANY PEOPLE YOUR AGE WHO HAVE HAD KNEE REPLACEMENT

## 2021-11-20 NOTE — Progress Notes (Unsigned)
Subjective:  Patient ID: Laurie [name], female    DOB: Nov 07, 1930  Age: 88 y.o. MRN: [id number]  CC: The primary encounter diagnosis was Primary hypertension. Diagnoses of Mixed hyperlipidemia, Impaired fasting glucose, and Other fatigue were also pertinent to this visit.   HPI Laurie [name] presents for  Chief Complaint  Patient presents with   Follow-up    6 month follow up    1) left knee pain.  Getting PT several times per week.  She has had "one"injection."   By Dr Harlow Mares at [facility] , With no appreciable change in her pain level.   Her pain prevents her from doing her usual activities for more than a few minutes  at a time,  to rest.   Not taking anything,,  including tylenol and tramado.  No pain at night,  just with activity  2) oab:  taking one trospium daily at dinner.  Still having nocutira 3 times per nigh.  Dry mouth and constipation noted.     Outpatient Medications Prior to Visit  Medication Sig Dispense Refill   chlorpheniramine (CHLOR-TRIMETON) 4 MG tablet Take 4 mg by mouth 2 (two) times daily as needed.     Cholecalciferol (D3-1000 PO) Take 1 tablet by mouth daily.     diclofenac Sodium (VOLTAREN) 1 % GEL      diltiazem (DILT-XR) 180 MG 24 hr capsule TAKE 1 CAPSULE BY MOUTH EVERY DAY 90 capsule 1   fish oil-omega-3 fatty acids 1000 MG capsule Take 1 g by mouth daily.      lisinopril (ZESTRIL) 40 MG tablet TAKE 1 TABLET BY MOUTH TWICE A DAY 180 tablet 0   Rivaroxaban (XARELTO) 15 MG TABS tablet Take 1 tablet (15 mg total) by mouth daily with supper. 30 tablet 5   rosuvastatin (CRESTOR) 20 MG tablet Take 1 tablet (20 mg total) by mouth 2 (two) times a week. 30 tablet 5   trospium (SANCTURA) 20 MG tablet TAKE 1 TABLET BY MOUTH TWICE A DAY 180 tablet 1   traMADol (ULTRAM) 50 MG tablet Take 50 mg by mouth every 6 (six) hours as needed. (Patient not taking: Reported on 09/05/2021)     No facility-administered medications prior to visit.    Review of  Systems;  Patient denies headache, fevers, malaise, unintentional weight loss, skin rash, eye pain, sinus congestion and sinus pain, sore throat, dysphagia,  hemoptysis , cough, dyspnea, wheezing, chest pain, palpitations, orthopnea, edema, abdominal pain, nausea, melena, diarrhea, constipation, flank pain, dysuria, hematuria, urinary  Frequency, nocturia, numbness, tingling, seizures,  Focal weakness, Loss of consciousness,  Tremor, insomnia, depression, anxiety, and suicidal ideation.      Objective:  BP (!) 144/90 (BP Location: Left Arm, Patient Position: Sitting, Cuff Size: Normal)   Pulse 70   Temp (!) 97.3 F (36.3 C) (Oral)   Ht 5' 4"  (1.626 m)   Wt 137 lb 6.4 oz (62.3 kg)   SpO2 98%   BMI 23.58 kg/m   BP Readings from Last 3 Encounters:  11/20/21 (!) 144/90  09/05/21 (!) 179/80  04/26/21 138/82    Wt Readings from Last 3 Encounters:  11/20/21 137 lb 6.4 oz (62.3 kg)  09/05/21 141 lb (64 kg)  06/09/21 140 lb (63.5 kg)    General appearance: alert, cooperative and appears stated age Ears: normal TM's and external ear canals both ears Throat: lips, mucosa, and tongue normal; teeth and gums normal Neck: no adenopathy, no carotid bruit, supple, symmetrical, trachea midline and  thyroid not enlarged, symmetric, no tenderness/mass/nodules Back: symmetric, no curvature. ROM normal. No CVA tenderness. Lungs: clear to auscultation bilaterally Heart: regular rate and rhythm, S1, S2 normal, no murmur, click, rub or gallop Abdomen: soft, non-tender; bowel sounds normal; no masses,  no organomegaly Pulses: 2+ and symmetric Skin: Skin color, texture, turgor normal. No rashes or lesions Lymph nodes: Cervical, supraclavicular, and axillary nodes normal.  Lab Results  Component Value Date   HGBA1C 6.0 05/10/2020   HGBA1C 6.2 11/11/2019   HGBA1C 6.0 06/09/2019    Lab Results  Component Value Date   CREATININE 0.91 04/26/2021   CREATININE 0.86 11/10/2020   CREATININE 0.63  06/27/2020    Lab Results  Component Value Date   WBC 5.5 11/10/2020   HGB 14.5 11/10/2020   HCT 43.7 11/10/2020   PLT 203.0 11/10/2020   GLUCOSE 91 04/26/2021   CHOL 251 (H) 11/10/2020   TRIG 150.0 (H) 11/10/2020   HDL 58.80 11/10/2020   LDLDIRECT 148.0 01/29/2018   LDLCALC 162 (H) 11/10/2020   ALT 16 04/26/2021   AST 19 04/26/2021   NA 140 04/26/2021   K 4.3 04/26/2021   CL 102 04/26/2021   CREATININE 0.91 04/26/2021   BUN 22 04/26/2021   CO2 22 04/26/2021   TSH 2.77 01/29/2018   INR 1.0 11/16/2011   HGBA1C 6.0 05/10/2020   MICROALBUR 0.8 04/16/2017    Ultrasound renal complete  Result Date: 01/30/2021 CLINICAL DATA:  Microhematuria. EXAM: RENAL / URINARY TRACT ULTRASOUND COMPLETE COMPARISON:  None. FINDINGS: Right Kidney: Renal measurements: 10.5 cm x 4.7 cm x 4.5 cm = volume: 115.7 mL. Echogenicity within normal limits. A 3.3 cm x 3.0 cm x 2.8 cm anechoic structure is seen along the lower pole of the right kidney. No abnormal flow is seen within this region on color Doppler evaluation. No hydronephrosis is visualized. Left Kidney: Renal measurements: 10.2 cm x 4.7 cm x 4.6 cm = volume: 131.0 mL. Echogenicity within normal limits. No mass or hydronephrosis visualized. Bladder: Appears normal for degree of bladder distention. Other: None. IMPRESSION: Simple right renal cyst, likely benign. Electronically Signed   By: Virgina Norfolk M.D.   On: 01/30/2021 21:12    Assessment & Plan:   Problem List Items Addressed This Visit     Hypertension - Primary   Relevant Orders   Comp Met (CMET)   Urine Microalbumin w/creat. ratio   Hyperlipidemia   Relevant Orders   Lipid Profile   Direct LDL   Other Visit Diagnoses     Impaired fasting glucose       Relevant Orders   HgB A1c   Other fatigue       Relevant Orders   CBC with Differential/Platelet   TSH       I spent a total of   minutes with this patient in a face to face visit on the date of this encounter  reviewing the last office visit with me on        ,  most recent with patient's cardiologist in    ,  patient'ss diet and eating habits, home blood pressure readings ,  most recent imaging study ,   and post visit ordering of testing and therapeutics.    Follow-up: No follow-ups on file.   Crecencio Mc, MD

## 2021-11-21 DIAGNOSIS — G8929 Other chronic pain: Secondary | ICD-10-CM | POA: Insufficient documentation

## 2021-11-21 LAB — LIPID PANEL
Cholesterol: 141 mg/dL (ref 0–200)
HDL: 61.2 mg/dL (ref >39.00)
LDL Cholesterol: 57 mg/dL (ref 0–99)
NonHDL: 79.45
Total CHOL/HDL Ratio: 2
Triglycerides: 112 mg/dL (ref 0.0–149.0)
VLDL: 22.4 mg/dL (ref 0.0–40.0)

## 2021-11-21 LAB — COMPREHENSIVE METABOLIC PANEL
ALT: 19 U/L (ref 0–35)
AST: 19 U/L (ref 0–37)
Albumin: 4.3 g/dL (ref 3.5–5.2)
Alkaline Phosphatase: 54 U/L (ref 39–117)
BUN: 16 mg/dL (ref 6–23)
CO2: 28 mEq/L (ref 19–32)
Calcium: 10.1 mg/dL (ref 8.4–10.5)
Chloride: 102 mEq/L (ref 96–112)
Creatinine, Ser: 0.82 mg/dL (ref 0.40–1.20)
GFR: 63.73 mL/min (ref >60.00)
Glucose, Bld: 79 mg/dL (ref 70–99)
Potassium: 3.7 mEq/L (ref 3.5–5.1)
Sodium: 138 mEq/L (ref 135–145)
Total Bilirubin: 1 mg/dL (ref 0.2–1.2)
Total Protein: 6.5 g/dL (ref 6.0–8.3)

## 2021-11-21 LAB — CBC WITH DIFFERENTIAL/PLATELET
Basophils Absolute: 0.1 10*3/uL (ref 0.0–0.1)
Basophils Relative: 1.5 % (ref 0.0–3.0)
Eosinophils Absolute: 0.1 10*3/uL (ref 0.0–0.7)
Eosinophils Relative: 1.7 % (ref 0.0–5.0)
HCT: 39.4 % (ref 36.0–46.0)
Hemoglobin: 13.3 g/dL (ref 12.0–15.0)
Lymphocytes Relative: 19 % (ref 12.0–46.0)
Lymphs Abs: 1.4 10*3/uL (ref 0.7–4.0)
MCHC: 33.7 g/dL (ref 30.0–36.0)
MCV: 94.1 fl (ref 78.0–100.0)
Monocytes Absolute: 0.4 10*3/uL (ref 0.1–1.0)
Monocytes Relative: 5.4 % (ref 3.0–12.0)
Neutro Abs: 5.3 10*3/uL (ref 1.4–7.7)
Neutrophils Relative %: 72.4 % (ref 43.0–77.0)
Platelets: 189 10*3/uL (ref 150.0–400.0)
RBC: 4.18 Mil/uL (ref 3.87–5.11)
RDW: 15.1 % (ref 11.5–15.5)
WBC: 7.3 10*3/uL (ref 4.0–10.5)

## 2021-11-21 LAB — MICROALBUMIN / CREATININE URINE RATIO
Creatinine,U: 68.8 mg/dL
Microalb Creat Ratio: 20.9 mg/g (ref 0.0–30.0)
Microalb, Ur: 14.3 mg/dL — ABNORMAL HIGH (ref 0.0–1.9)

## 2021-11-21 LAB — HEMOGLOBIN A1C: Hgb A1c MFr Bld: 6.1 % (ref 4.6–6.5)

## 2021-11-21 LAB — LDL CHOLESTEROL, DIRECT: Direct LDL: 57 mg/dL

## 2021-11-21 LAB — TSH: TSH: 1.43 u[IU]/mL (ref 0.35–5.50)

## 2021-11-21 NOTE — Assessment & Plan Note (Signed)
Records requested.  Scheduled Tylenol dosing outlined and prn use of tramadol  Has been authorized

## 2021-11-21 NOTE — Assessment & Plan Note (Signed)
Well controlled on current regimen of diltiazem and lisinopril.  She prefers ACE Inhibitor over ARB   . Renal function assessment is due , no changes today.  Lab Results  Component Value Date   CREATININE 0.91 04/26/2021   Lab Results  Component Value Date   NA 140 04/26/2021   K 4.3 04/26/2021   CL 102 04/26/2021   CO2 22 04/26/2021

## 2021-11-21 NOTE — Assessment & Plan Note (Signed)
She remains rate controlled on Diltiazem CR and anticoagulated with Xarelto.  She is asymptomatic with activities

## 2021-11-21 NOTE — Assessment & Plan Note (Signed)
Now treated with twice weekly rosuvstatin .  LFTS are normal.  Lab Results  Component Value Date   ALT 16 04/26/2021   AST 19 04/26/2021   ALKPHOS 61 11/10/2020   BILITOT 0.7 04/26/2021

## 2021-11-21 NOTE — Assessment & Plan Note (Signed)
Would likely not tolerate higher doses of Sanctura due to s/e of dry mouth and constipation.   Myrbetriq not covered.  advsied to delay dosing of Sanctura until bedtime and reduce evening liquids

## 2021-11-21 NOTE — Assessment & Plan Note (Signed)
Secondary to chronic atrial fibrillation.  She is taking xarelto without bleeding or bruising .  Cbc is normal   Lab Results  Component Value Date   WBC 5.5 11/10/2020   HGB 14.5 11/10/2020   HCT 43.7 11/10/2020   MCV 90.7 11/10/2020   PLT 203.0 11/10/2020

## 2021-11-23 DIAGNOSIS — M1712 Unilateral primary osteoarthritis, left knee: Secondary | ICD-10-CM | POA: Diagnosis not present

## 2021-11-23 DIAGNOSIS — M25562 Pain in left knee: Secondary | ICD-10-CM | POA: Diagnosis not present

## 2021-11-27 ENCOUNTER — Other Ambulatory Visit: Payer: Self-pay | Admitting: Internal Medicine

## 2021-11-27 DIAGNOSIS — M1712 Unilateral primary osteoarthritis, left knee: Secondary | ICD-10-CM | POA: Diagnosis not present

## 2021-11-27 DIAGNOSIS — M25562 Pain in left knee: Secondary | ICD-10-CM | POA: Diagnosis not present

## 2021-12-01 DIAGNOSIS — M1712 Unilateral primary osteoarthritis, left knee: Secondary | ICD-10-CM | POA: Diagnosis not present

## 2021-12-07 DIAGNOSIS — M25562 Pain in left knee: Secondary | ICD-10-CM | POA: Diagnosis not present

## 2021-12-07 DIAGNOSIS — M1712 Unilateral primary osteoarthritis, left knee: Secondary | ICD-10-CM | POA: Diagnosis not present

## 2021-12-15 DIAGNOSIS — M1712 Unilateral primary osteoarthritis, left knee: Secondary | ICD-10-CM | POA: Diagnosis not present

## 2021-12-22 DIAGNOSIS — M13862 Other specified arthritis, left knee: Secondary | ICD-10-CM | POA: Diagnosis not present

## 2021-12-22 DIAGNOSIS — M25562 Pain in left knee: Secondary | ICD-10-CM | POA: Diagnosis not present

## 2021-12-22 DIAGNOSIS — M1712 Unilateral primary osteoarthritis, left knee: Secondary | ICD-10-CM | POA: Diagnosis not present

## 2022-01-01 DIAGNOSIS — M25562 Pain in left knee: Secondary | ICD-10-CM | POA: Diagnosis not present

## 2022-01-01 DIAGNOSIS — M13862 Other specified arthritis, left knee: Secondary | ICD-10-CM | POA: Diagnosis not present

## 2022-01-01 DIAGNOSIS — M1712 Unilateral primary osteoarthritis, left knee: Secondary | ICD-10-CM | POA: Diagnosis not present

## 2022-02-19 DIAGNOSIS — M25562 Pain in left knee: Secondary | ICD-10-CM | POA: Diagnosis not present

## 2022-02-19 DIAGNOSIS — M13862 Other specified arthritis, left knee: Secondary | ICD-10-CM | POA: Diagnosis not present

## 2022-02-21 ENCOUNTER — Telehealth: Payer: Self-pay | Admitting: Cardiovascular Disease

## 2022-02-21 NOTE — Telephone Encounter (Signed)
Spoke with patient to inquire if I could assist her with anything and she persisted that she speak with Dr. Fletcher Anon. She again stated that she wants to speak with Dr. Fletcher Anon. She wants reassurance regarding her upcoming surgery. Advised I would send this message over to Dr. Fletcher Anon and his nurse for review and they may not respond today. She verbalized understanding and had no further questions at this time.

## 2022-02-21 NOTE — Telephone Encounter (Signed)
Pt states they would like a call back in regards to upcoming knee surgery. She would not give further details and explained that she would rather talk to Dr. Fletcher Anon and no one else. She said it would give her great comfort to talk to the doctor.

## 2022-02-22 NOTE — Telephone Encounter (Signed)
I called the patient today.  She wanted reassurance regarding upcoming left knee surgery and instructions about when to hold Xarelto.  Her surgery will be scheduled in the next few months.  Please schedule her for an office follow-up visit in the next few weeks to ensure stability of cardiac condition given that she has not been seen in a year.

## 2022-02-26 ENCOUNTER — Telehealth: Payer: Self-pay | Admitting: Cardiovascular Disease

## 2022-02-26 NOTE — Telephone Encounter (Signed)
   Pre-operative Risk Assessment    Patient Name: Laurie [name]  DOB: [birth year] MRN: [id number]      Request for Surgical Clearance    Procedure:   Lt TKA traditional knee  Date of Surgery:  Clearance 06/14/22                                 Surgeon:  Dr Kurtis Bushman Surgeon's Group or Practice Name:  [facility] Phone number:  [phone] Fax number:  [phone]   Type of Clearance Requested:   - Pharmacy:  Hold Rivaroxaban (Xarelto) instructions   Type of Anesthesia:  Local /Spinal/Block   Additional requests/questions:    Manfred Arch   02/26/2022, 4:25 PM

## 2022-02-27 ENCOUNTER — Other Ambulatory Visit: Payer: Self-pay | Admitting: Internal Medicine

## 2022-02-27 NOTE — Telephone Encounter (Signed)
Preop request received.  Routed to pharmacy team for input.   Separate telephone encounter with Dr. Fletcher Anon - pt has requested OV prior to surgery and [facility] scheduling team is working to get her scheduled.   Loel Dubonnet, NP

## 2022-03-01 ENCOUNTER — Encounter: Payer: Self-pay | Admitting: Physician Assistant

## 2022-03-01 ENCOUNTER — Ambulatory Visit: Payer: Medicare HMO | Attending: Physician Assistant | Admitting: Physician Assistant

## 2022-03-01 VITALS — BP 150/70 | HR 66 | Ht 64.0 in | Wt 138.8 lb

## 2022-03-01 DIAGNOSIS — Z01810 Encounter for preprocedural cardiovascular examination: Secondary | ICD-10-CM | POA: Diagnosis not present

## 2022-03-01 DIAGNOSIS — I482 Chronic atrial fibrillation, unspecified: Secondary | ICD-10-CM | POA: Diagnosis not present

## 2022-03-01 DIAGNOSIS — I10 Essential (primary) hypertension: Secondary | ICD-10-CM | POA: Diagnosis not present

## 2022-03-01 DIAGNOSIS — E785 Hyperlipidemia, unspecified: Secondary | ICD-10-CM | POA: Diagnosis not present

## 2022-03-01 NOTE — Patient Instructions (Signed)
Medication Instructions:  - Your physician recommends that you continue on your current medications as directed. Please refer to the Current Medication list given to you today.  *If you need a refill on your cardiac medications before your next appointment, please call your pharmacy*   Lab Work: - none ordered  If you have labs (blood work) drawn today and your tests are completely normal, you will receive your results only by: [place] (if you have MyChart) OR A paper copy in the mail If you have any lab test that is abnormal or we need to change your treatment, we will call you to review the results.   Testing/Procedures: - none ordered   Follow-Up: At [facility], you and your health needs are our priority.  As part of our continuing mission to provide you with exceptional heart care, we have created designated Provider Care Teams.  These Care Teams include your primary Cardiologist (physician) and Advanced Practice Providers (APPs -  Physician Assistants and Nurse Practitioners) who all work together to provide you with the care you need, when you need it.  We recommend signing up for the patient portal called "MyChart".  Sign up information is provided on this After Visit Summary.  MyChart is used to connect with patients for Virtual Visits (Telemedicine).  Patients are able to view lab/test results, encounter notes, upcoming appointments, etc.  Non-urgent messages can be sent to your provider as well.   To learn more about what you can do with MyChart, go to [email or web address].    Your next appointment:   1 year(s)  The format for your next appointment:   In Person  Provider:   Kathlyn Sacramento, MD    Other Instructions N/a  Important Information About Sugar

## 2022-03-01 NOTE — Progress Notes (Signed)
Cardiology Office Note    Date:  03/01/2022   ID:  Laurie [name], Laurie [name] [birth year], MRN [id number]  PCP:  Sherlene Shams, MD  Cardiologist:  Lorine Bears, MD  Electrophysiologist:  None   Chief Complaint: Preoperative cardiac risk stratification  History of Present Illness:   Laurie [name] is a 88 y.o. female with history of coronary artery calcification, aortic atherosclerosis, permanent A-fib, HTN, and HLD who presents for preoperative cardiac risk stratification for planned left TKA on 06/14/2022.  Prior stress echo from 09/2010, at [facility], demonstrated a hypertensive response to exercise without EKG changes suggestive of ischemia or symptoms concerning for angina.  There was no echocardiographic evidence of ischemia at rest or with stress.  A-fib was diagnosed in 2013, though the exact onset was not very clear as prior documentation indicates there was mention in her record in 09/2010 at [facility].  She has been managed with Xarelto.  Given she was minimally symptomatic from A-fib, and with the possible duration of A-fib, rate control strategy was pursued.  Echo at that time demonstrated an EF of 55 to 60%, mild concentric hypertrophy, no regional wall motion abnormalities, mild aortic insufficiency, mild mitral regurgitation, mildly dilated left atrium, and a PASP of 36 mmHg.  CTA of the chest in 06/2020 showed coronary artery calcification as well as aortic atherosclerosis.  She was last seen in the office in 02/2021 and was without symptoms of angina, decompensation, or palpitations.  She was tolerating anticoagulation without adverse effect.  She is scheduled to undergo a left TKA on 06/14/2022.  She comes in doing well from a cardiac perspective, and is without symptoms of angina or decompensation.  No palpitations, dizziness, presyncope, or syncope.  No falls since she was last seen.  No symptoms of hematochezia or melena.  She is tolerating cardiac medications without issues.  She remains  very active at baseline, volunteering at the food pantry each Friday.  She does her own housework and some of her yard work without cardiac limitation.  Overall, she feels like she is doing well and does not have any active cardiac issues or concerns at this time.  Revised Cardiac Risk Index: low risk for noncardiac surgery, with an estimated rate of 0.6% for adverse cardiac event in the perioperative timeframe  [facility] Activity Status Index: > 4 METs without cardiac limitation    Labs independently reviewed: 11/2021 - TSH normal, Hgb 13.3, PLT 189, A1c 6.1, potassium 3.7, BUN 16, serum creatinine 0.82, albumin 4.3, AST/ALT normal, direct LDL 57, TC 141, TG 112, HDL 61  Past Medical History:  Diagnosis Date   Arrhythmia    Hypertension     Past Surgical History:  Procedure Laterality Date   TONSILLECTOMY      Current Medications: Current Meds  Medication Sig   Calcium Carbonate-Vit D-Min (CALCIUM 1200 PO) Take 1,200 mg by mouth 2 (two) times daily.   chlorpheniramine (CHLOR-TRIMETON) 4 MG tablet Take 4 mg by mouth 2 (two) times daily as needed.   Cholecalciferol (D3-1000 PO) Take 1 tablet by mouth daily.   diclofenac Sodium (VOLTAREN) 1 % GEL    diltiazem (DILT-XR) 180 MG 24 hr capsule TAKE 1 CAPSULE BY MOUTH EVERY DAY   fish oil-omega-3 fatty acids 1000 MG capsule Take 1 g by mouth daily.    lisinopril (ZESTRIL) 40 MG tablet TAKE 1 TABLET BY MOUTH TWICE A DAY   Rivaroxaban (XARELTO) 15 MG TABS tablet Take 1 tablet (15 mg total) by mouth daily  with supper.   rosuvastatin (CRESTOR) 20 MG tablet Take 1 tablet (20 mg total) by mouth 2 (two) times a week.   trospium (SANCTURA) 20 MG tablet TAKE 1 TABLET BY MOUTH TWICE A DAY (Patient taking differently: Take 20 mg by mouth at bedtime.)    Allergies:   Hydrochlorothiazide, Losartan, Metoprolol, and Triamterene   Social History   Socioeconomic History   Marital status: Widowed    Spouse name: Not on file   Number of children: Not on  file   Years of education: Not on file   Highest education level: Not on file  Occupational History   Not on file  Tobacco Use   Smoking status: Never   Smokeless tobacco: Never  Substance and Sexual Activity   Alcohol use: No   Drug use: No   Sexual activity: Never  Other Topics Concern   Not on file  Social History Narrative   Not on file   Social Determinants of Health   Financial Resource Strain: Low Risk  (04/12/2021)   Overall Financial Resource Strain (CARDIA)    Difficulty of Paying Living Expenses: Not hard at all  Food Insecurity: No Food Insecurity (04/12/2021)   Hunger Vital Sign    Worried About Running Out of Food in the Last Year: Never true    Ran Out of Food in the Last Year: Never true  Transportation Needs: No Transportation Needs (04/12/2021)   PRAPARE - Administrator, Civil Service (Medical): No    Lack of Transportation (Non-Medical): No  Physical Activity: Unknown (01/23/2018)   Exercise Vital Sign    Days of Exercise per Week: 0 days    Minutes of Exercise per Session: Not on file  Stress: No Stress Concern Present (04/12/2021)   [employer] of Occupational Health - Occupational Stress Questionnaire    Feeling of Stress : Not at all  Social Connections: Moderately Integrated (04/12/2021)   Social Connection and Isolation Panel [NHANES]    Frequency of Communication with Friends and Family: More than three times a week    Frequency of Social Gatherings with Friends and Family: More than three times a week    Attends Religious Services: More than 4 times per year    Active Member of [employer] or Organizations: Yes    Attends Banker Meetings: More than 4 times per year    Marital Status: Widowed     Family History:  The patient's family history includes Anemia in her mother; Heart disease (age of onset: 67) in her father; Kidney disease in her mother.  ROS:   12-point review of systems is negative unless otherwise noted in  the HPI.   EKGs/Labs/Other Studies Reviewed:    Studies reviewed were summarized above. The additional studies were reviewed today:  2D echo 11/28/2011: - Left ventricle: The cavity size was normal. There was mild    concentric hypertrophy. Systolic function was normal. The    estimated ejection fraction was in the range of 55% to    60%. Wall motion was normal; there were no regional wall    motion abnormalities. The study is not technically    sufficient to allow evaluation of LV diastolic function.  - Aortic valve: Mild regurgitation.  - Mitral valve: Mild regurgitation.  - Left atrium: The atrium was mildly dilated.  - Pulmonary arteries: Systolic pressure was mildly elevated.    PA peak pressure: 35mm Hg (S).  Impressions:   - Rhythm noted to be atrial  fibrillation.   EKG:  EKG is ordered today.  The EKG ordered today demonstrates A-fib, 66 bpm, prior septal infarct versus lead placement, poor R wave progression along the precordial leads, nonspecific ST-T changes, consistent with prior tracing  Recent Labs: 11/20/2021: ALT 19; BUN 16; Creatinine, Ser 0.82; Hemoglobin 13.3; Platelets 189.0; Potassium 3.7; Sodium 138; TSH 1.43  Recent Lipid Panel    Component Value Date/Time   CHOL 141 11/20/2021 1514   TRIG 112.0 11/20/2021 1514   HDL 61.20 11/20/2021 1514   CHOLHDL 2 11/20/2021 1514   VLDL 22.4 11/20/2021 1514   LDLCALC 57 11/20/2021 1514   LDLCALC 154 (H) 11/07/2018 1502   LDLDIRECT 57.0 11/20/2021 1514    PHYSICAL EXAM:    VS:  BP (!) 150/70 (BP Location: Right Arm)   Pulse 66   Ht 5\' 4"  (1.626 m)   Wt 138 lb 12.8 oz (63 kg)   SpO2 98%   BMI 23.82 kg/m   BMI: Body mass index is 23.82 kg/m.  Physical Exam Vitals reviewed.  Constitutional:      Appearance: She is well-developed.  HENT:     Head: Normocephalic and atraumatic.  Eyes:     General:        Right eye: No discharge.        Left eye: No discharge.  Neck:     Vascular: No JVD.   Cardiovascular:     Rate and Rhythm: Normal rate. Rhythm irregularly irregular.     Pulses:          Posterior tibial pulses are 2+ on the right side and 2+ on the left side.     Heart sounds: Normal heart sounds, S1 normal and S2 normal. Heart sounds not distant. No midsystolic click and no opening snap. No murmur heard.    No friction rub.  Pulmonary:     Effort: Pulmonary effort is normal. No respiratory distress.     Breath sounds: Normal breath sounds. No decreased breath sounds, wheezing or rales.  Chest:     Chest wall: No tenderness.  Abdominal:     General: There is no distension.  Musculoskeletal:     Cervical back: Normal range of motion.     Right lower leg: No edema.     Left lower leg: No edema.  Skin:    General: Skin is warm and dry.     Nails: There is no clubbing.  Neurological:     Mental Status: She is alert and oriented to person, place, and time.  Psychiatric:        Speech: Speech normal.        Behavior: Behavior normal.        Thought Content: Thought content normal.        Judgment: Judgment normal.     Wt Readings from Last 3 Encounters:  03/01/22 138 lb 12.8 oz (63 kg)  11/20/21 137 lb 6.4 oz (62.3 kg)  09/05/21 141 lb (64 kg)     ASSESSMENT & PLAN:   Preoperative cardiac risk stratification: Scheduled to undergo left TKA on 06/14/2022.  Per RCRI, she is low risk for noncardiac surgery with an estimated rate of 0.6% for adverse cardiac event in the perioperative timeframe.  Per [facility] Activity Status Index, she can achieve > 4 METs without cardiac limitation.  We are awaiting input from our pharmacy team regarding the timing of interruption of the patient's rivaroxaban, though suspect this will be able to be held for 3 days  prior (spinal anesthesia) with recommendation to be resumed as soon as safely possible in the postoperative period, as dictated by the surgical team.    Permanent A-fib: Rate well controlled on diltiazem 180 mg daily, which will  be continued.  CHA2DS2-VASc at least 5 (HTN, age x2, vascular disease, sex category).  She remains on Xarelto 15 mg daily, and is without symptoms concerning for bleeding.  CrCl 46.2.  Recent labs showed stable renal function, electrolytes, and Hgb.  HTN: Blood pressure mildly elevated in the office, possibly exacerbated by knee pain.  She remains on diltiazem and lisinopril.  Continue to monitor as knee pain improves postoperatively.  Coronary artery calcification/aortic atherosclerosis/HLD: LDL 57 in 11/2021 with normal AST/ALT at that time.  No symptoms concerning for angina or decompensation.  She remains on rosuvastatin 20 mg.  Followed by PCP.   Disposition: F/u with Dr. Fletcher Anon or an APP in 12 months.   Medication Adjustments/Labs and Tests Ordered: Current medicines are reviewed at length with the patient today.  Concerns regarding medicines are outlined above. Medication changes, Labs and Tests ordered today are summarized above and listed in the Patient Instructions accessible in Encounters.   Signed, Christell Faith, PA-C 03/01/2022 4:45 PM     [facility] [address] [place] [address] [place], [place] [address] [phone]

## 2022-03-05 ENCOUNTER — Telehealth: Payer: Self-pay | Admitting: Cardiovascular Disease

## 2022-03-05 MED ORDER — RIVAROXABAN 15 MG PO TABS: 15.0000 mg | ORAL_TABLET | Freq: Every day | ORAL | 3 refills | Status: DC

## 2022-03-05 NOTE — Telephone Encounter (Signed)
Spoke with patient and she requested medication prescription be sent to the [facility] in [place]. She requested samples to allow time for her prescription to arrive. Samples placed up front and prescription has been sent.    Medication Samples have been provided to the patient.  Drug name: Xarelto        Strength: 15 mg         Qty: 4 bottles   LOT: [id number]   Exp.Date: 08-24

## 2022-03-05 NOTE — Telephone Encounter (Signed)
Pt c/o medication issue:   1. Name of Medication: Rivaroxaban (XARELTO) 15 MG TABS tablet   2. How are you currently taking this medication (dosage and times per day)? Take 1 tablet (15 mg total) by mouth daily with supper.   3. Are you having a reaction (difficulty breathing--STAT)? No    4. What is your medication issue? Patient needs a 3 refill, 90 day supply of medication sent to  [facility], [place],  [address] [phone]

## 2022-03-05 NOTE — Telephone Encounter (Signed)
Pt c/o medication issue:  1. Name of Medication: Rivaroxaban (XARELTO) 15 MG TABS tablet  2. How are you currently taking this medication (dosage and times per day)? Take 1 tablet (15 mg total) by mouth daily with supper.  3. Are you having a reaction (difficulty breathing--STAT)? No   4. What is your medication issue? Patient needs a 3 refill, 90 day supply of medication sent to

## 2022-03-06 NOTE — Telephone Encounter (Signed)
   Primary Cardiologist: Kathlyn Sacramento, MD  Chart reviewed as part of pre-operative protocol coverage. Given past medical history and time since last visit, based on ACC/AHA guidelines, Laurie [name] was seen in the office on 03/01/22 by Christell Faith, PA for clearance. No further cardiac testing is required.   Per office protocol, she may hold Xarelto for 3 days prior to procedure.   I will route this recommendation to the requesting party via Epic fax function and remove from pre-op pool.  Please call with questions.  Emmaline Life, NP-C  03/06/2022, 2:33 PM [address], [address] Office [phone] Fax [phone]

## 2022-03-06 NOTE — Telephone Encounter (Signed)
Patient with diagnosis of A Fib on Xarelto for anticoagulation.    Procedure: Lt TKA traditional knee Date of procedure: 06/14/22   CHA2DS2-VASc Score = 5  This indicates a 7.2% annual risk of stroke. The patient's score is based upon: CHF History: 0 HTN History: 1 Diabetes History: 0 Stroke History: 0 Vascular Disease History: 1 Age Score: 2 Gender Score: 1  CrCl 46 mL/min Platelet count 189K   Per office protocol, patient can hold Xarelto for 3 days prior to procedure.     **This guidance is not considered finalized until pre-operative APP has relayed final recommendations.**

## 2022-03-06 NOTE — Progress Notes (Deleted)
03/07/2022 7:05 PM   Laurie [name] April 25, 1931 [id number]  Referring provider: Crecencio Mc, MD [place],  [place] [id number]  Urological history: 1. High risk hematuria -non-smoker -RUS 2022 - simple right renal cyst -cysto 2022 - NED -no reports of gross heme -UA (09/2021) negative for micro heme  2. OAB -Contributing factors of age, vaginal atrophy and HTN -PVR *** -Managed with trospium 20 mg qhs   No chief complaint on file.    HPI: Laurie [name] is a 88 y.o. female who presents today for a 6 month follow up.  UA ***  PVR ***  PMH: Past Medical History:  Diagnosis Date   Arrhythmia    Hypertension     Surgical History: Past Surgical History:  Procedure Laterality Date   TONSILLECTOMY      Home Medications:  Allergies as of 03/07/2022       Reactions   Hydrochlorothiazide    Losartan    angioedema   Metoprolol    Triamterene         Medication List        Accurate as of March 06, 2022  7:05 PM. If you have any questions, ask your nurse or doctor.          CALCIUM 1200 PO Take 1,200 mg by mouth 2 (two) times daily.   chlorpheniramine 4 MG tablet Commonly known as: CHLOR-TRIMETON Take 4 mg by mouth 2 (two) times daily as needed.   D3-1000 PO Take 1 tablet by mouth daily.   diclofenac Sodium 1 % Gel Commonly known as: VOLTAREN   diltiazem 180 MG 24 hr capsule Commonly known as: Dilt-XR TAKE 1 CAPSULE BY MOUTH EVERY DAY   fish oil-omega-3 fatty acids 1000 MG capsule Take 1 g by mouth daily.   lisinopril 40 MG tablet Commonly known as: ZESTRIL TAKE 1 TABLET BY MOUTH TWICE A DAY   Rivaroxaban 15 MG Tabs tablet Commonly known as: XARELTO Take 1 tablet (15 mg total) by mouth daily with supper.   rosuvastatin 20 MG tablet Commonly known as: CRESTOR Take 1 tablet (20 mg total) by mouth 2 (two) times a week.   traMADol 50 MG tablet Commonly known as: ULTRAM Take 50 mg by mouth every 6  (six) hours as needed.   trospium 20 MG tablet Commonly known as: SANCTURA TAKE 1 TABLET BY MOUTH TWICE A DAY What changed: when to take this        Allergies:  Allergies  Allergen Reactions   Hydrochlorothiazide    Losartan     angioedema   Metoprolol    Triamterene     Family History: Family History  Problem Relation Age of Onset   Kidney disease Mother    Anemia Mother    Heart disease Father 39    Social History:  reports that she has never smoked. She has never used smokeless tobacco. She reports that she does not drink alcohol and does not use drugs.  ROS: Pertinent ROS in HPI  Physical Exam: There were no vitals taken for this visit.  Constitutional:  Well nourished. Alert and oriented, No acute distress. HEENT: [place] AT, moist mucus membranes.  Trachea midline, no masses. Cardiovascular: No clubbing, cyanosis, or edema. Respiratory: Normal respiratory effort, no increased work of breathing. GI: Abdomen is soft, non tender, non distended, no abdominal masses. Liver and spleen not palpable.  No hernias appreciated.  Stool sample for occult testing is not indicated.  GU: No CVA tenderness.  No bladder fullness or masses.  *** external genitalia, *** pubic hair distribution, no lesions.  Normal urethral meatus, no lesions, no prolapse, no discharge.   No urethral masses, tenderness and/or tenderness. No bladder fullness, tenderness or masses. *** vagina mucosa, *** estrogen effect, no discharge, no lesions, *** pelvic support, *** cystocele and *** rectocele noted.  No cervical motion tenderness.  Uterus is freely mobile and non-fixed.  No adnexal/parametria masses or tenderness noted.  Anus and perineum are without rashes or lesions.   ***  Skin: No rashes, bruises or suspicious lesions. Lymph: No cervical or inguinal adenopathy. Neurologic: Grossly intact, no focal deficits, moving all 4 extremities. Psychiatric: Normal mood and affect.    Laboratory Data:     Latest Ref Rng & Units 11/20/2021    3:14 PM 04/26/2021    4:40 PM 11/10/2020   10:00 AM  CMP  Glucose 70 - 99 mg/dL 79  91  103   BUN 6 - 23 mg/dL 16  22  16    Creatinine 0.40 - 1.20 mg/dL 0.82  0.91  0.86   Sodium 135 - 145 mEq/L 138  140  140   Potassium 3.5 - 5.1 mEq/L 3.7  4.3  4.6   Chloride 96 - 112 mEq/L 102  102  104   CO2 19 - 32 mEq/L 28  22  27    Calcium 8.4 - 10.5 mg/dL 10.1  9.7  10.3   Total Protein 6.0 - 8.3 g/dL 6.5  6.4  7.1   Total Bilirubin 0.2 - 1.2 mg/dL 1.0  0.7  0.9   Alkaline Phos 39 - 117 U/L 54   61   AST 0 - 37 U/L 19  19  18    ALT 0 - 35 U/L 19  16  13         Latest Ref Rng & Units 11/20/2021    3:14 PM 11/10/2020   10:00 AM 06/27/2020    6:56 PM  CBC  WBC 4.0 - 10.5 K/uL 7.3  5.5  7.0   Hemoglobin 12.0 - 15.0 g/dL 13.3  14.5  12.9   Hematocrit 36.0 - 46.0 % 39.4  43.7  39.1   Platelets 150.0 - 400.0 K/uL 189.0  203.0  440     Component     Latest Ref Rng 11/20/2021  Hemoglobin A1C     4.6 - 6.5 % 6.1     Urinalysis *** I have reviewed the labs.   Pertinent Imaging: ***   Assessment & Plan:    1. High risk hematuria -no reports of gross heme -no micro heme on recent UA's -continue to follow -pursue CTU if hematuria worsens  2. OAB -adequate bladder emptying -at goal on trospium 20 mg qhs -continue trospium    No follow-ups on file.  These notes generated with voice recognition software. I apologize for typographical errors.  [place], [place] [address]  [place], [place] [address] [phone]

## 2022-03-07 ENCOUNTER — Ambulatory Visit: Payer: Medicare HMO | Admitting: Urology

## 2022-03-07 DIAGNOSIS — N3281 Overactive bladder: Secondary | ICD-10-CM

## 2022-03-07 DIAGNOSIS — R319 Hematuria, unspecified: Secondary | ICD-10-CM

## 2022-03-22 DIAGNOSIS — Z23 Encounter for immunization: Secondary | ICD-10-CM | POA: Diagnosis not present

## 2022-03-22 DIAGNOSIS — M1712 Unilateral primary osteoarthritis, left knee: Secondary | ICD-10-CM | POA: Diagnosis not present

## 2022-03-22 DIAGNOSIS — G8918 Other acute postprocedural pain: Secondary | ICD-10-CM | POA: Diagnosis not present

## 2022-03-22 DIAGNOSIS — Z7901 Long term (current) use of anticoagulants: Secondary | ICD-10-CM | POA: Diagnosis not present

## 2022-03-22 DIAGNOSIS — I10 Essential (primary) hypertension: Secondary | ICD-10-CM | POA: Diagnosis not present

## 2022-03-22 DIAGNOSIS — M21162 Varus deformity, not elsewhere classified, left knee: Secondary | ICD-10-CM | POA: Diagnosis not present

## 2022-03-22 DIAGNOSIS — I4891 Unspecified atrial fibrillation: Secondary | ICD-10-CM | POA: Diagnosis not present

## 2022-03-22 DIAGNOSIS — E785 Hyperlipidemia, unspecified: Secondary | ICD-10-CM | POA: Diagnosis not present

## 2022-03-22 DIAGNOSIS — N3281 Overactive bladder: Secondary | ICD-10-CM | POA: Diagnosis not present

## 2022-03-22 HISTORY — PX: KNEE SURGERY: SHX244

## 2022-03-23 DIAGNOSIS — N3281 Overactive bladder: Secondary | ICD-10-CM | POA: Diagnosis not present

## 2022-03-23 DIAGNOSIS — Z7901 Long term (current) use of anticoagulants: Secondary | ICD-10-CM | POA: Diagnosis not present

## 2022-03-23 DIAGNOSIS — M21162 Varus deformity, not elsewhere classified, left knee: Secondary | ICD-10-CM | POA: Diagnosis not present

## 2022-03-23 DIAGNOSIS — M1712 Unilateral primary osteoarthritis, left knee: Secondary | ICD-10-CM | POA: Diagnosis not present

## 2022-03-23 DIAGNOSIS — Z96652 Presence of left artificial knee joint: Secondary | ICD-10-CM | POA: Diagnosis not present

## 2022-03-23 DIAGNOSIS — I4891 Unspecified atrial fibrillation: Secondary | ICD-10-CM | POA: Diagnosis not present

## 2022-03-23 DIAGNOSIS — Z23 Encounter for immunization: Secondary | ICD-10-CM | POA: Diagnosis not present

## 2022-03-23 DIAGNOSIS — I10 Essential (primary) hypertension: Secondary | ICD-10-CM | POA: Diagnosis not present

## 2022-03-23 DIAGNOSIS — E785 Hyperlipidemia, unspecified: Secondary | ICD-10-CM | POA: Diagnosis not present

## 2022-03-26 ENCOUNTER — Other Ambulatory Visit: Payer: Self-pay | Admitting: Orthopedic Surgery

## 2022-03-26 ENCOUNTER — Ambulatory Visit
Admission: RE | Admit: 2022-03-26 | Discharge: 2022-03-26 | Disposition: A | Discharge status: Home / Self Care | Payer: Medicare HMO | Source: Ambulatory Visit | Attending: Orthopedic Surgery | Admitting: Orthopedic Surgery

## 2022-03-26 DIAGNOSIS — R2242 Localized swelling, mass and lump, left lower limb: Secondary | ICD-10-CM | POA: Insufficient documentation

## 2022-03-26 DIAGNOSIS — M79662 Pain in left lower leg: Secondary | ICD-10-CM | POA: Diagnosis not present

## 2022-03-26 DIAGNOSIS — M7989 Other specified soft tissue disorders: Secondary | ICD-10-CM | POA: Diagnosis not present

## 2022-03-28 DIAGNOSIS — Z96652 Presence of left artificial knee joint: Secondary | ICD-10-CM | POA: Diagnosis not present

## 2022-03-28 DIAGNOSIS — Z471 Aftercare following joint replacement surgery: Secondary | ICD-10-CM | POA: Diagnosis not present

## 2022-03-28 DIAGNOSIS — Z7901 Long term (current) use of anticoagulants: Secondary | ICD-10-CM | POA: Diagnosis not present

## 2022-03-28 DIAGNOSIS — I10 Essential (primary) hypertension: Secondary | ICD-10-CM | POA: Diagnosis not present

## 2022-03-28 DIAGNOSIS — E785 Hyperlipidemia, unspecified: Secondary | ICD-10-CM | POA: Diagnosis not present

## 2022-03-29 DIAGNOSIS — Z7901 Long term (current) use of anticoagulants: Secondary | ICD-10-CM | POA: Diagnosis not present

## 2022-03-29 DIAGNOSIS — N3281 Overactive bladder: Secondary | ICD-10-CM | POA: Diagnosis not present

## 2022-03-29 DIAGNOSIS — I10 Essential (primary) hypertension: Secondary | ICD-10-CM | POA: Diagnosis not present

## 2022-03-29 DIAGNOSIS — Z9181 History of falling: Secondary | ICD-10-CM | POA: Diagnosis not present

## 2022-03-29 DIAGNOSIS — I4891 Unspecified atrial fibrillation: Secondary | ICD-10-CM | POA: Diagnosis not present

## 2022-03-29 DIAGNOSIS — Z471 Aftercare following joint replacement surgery: Secondary | ICD-10-CM | POA: Diagnosis not present

## 2022-03-29 DIAGNOSIS — E785 Hyperlipidemia, unspecified: Secondary | ICD-10-CM | POA: Diagnosis not present

## 2022-03-30 DIAGNOSIS — Z96659 Presence of unspecified artificial knee joint: Secondary | ICD-10-CM | POA: Diagnosis not present

## 2022-03-31 DIAGNOSIS — Z471 Aftercare following joint replacement surgery: Secondary | ICD-10-CM | POA: Diagnosis not present

## 2022-03-31 DIAGNOSIS — I4891 Unspecified atrial fibrillation: Secondary | ICD-10-CM | POA: Diagnosis not present

## 2022-03-31 DIAGNOSIS — N3281 Overactive bladder: Secondary | ICD-10-CM | POA: Diagnosis not present

## 2022-03-31 DIAGNOSIS — Z7901 Long term (current) use of anticoagulants: Secondary | ICD-10-CM | POA: Diagnosis not present

## 2022-03-31 DIAGNOSIS — I10 Essential (primary) hypertension: Secondary | ICD-10-CM | POA: Diagnosis not present

## 2022-03-31 DIAGNOSIS — E785 Hyperlipidemia, unspecified: Secondary | ICD-10-CM | POA: Diagnosis not present

## 2022-03-31 DIAGNOSIS — Z9181 History of falling: Secondary | ICD-10-CM | POA: Diagnosis not present

## 2022-04-01 DIAGNOSIS — I10 Essential (primary) hypertension: Secondary | ICD-10-CM | POA: Diagnosis not present

## 2022-04-01 DIAGNOSIS — Z471 Aftercare following joint replacement surgery: Secondary | ICD-10-CM | POA: Diagnosis not present

## 2022-04-01 DIAGNOSIS — I4891 Unspecified atrial fibrillation: Secondary | ICD-10-CM | POA: Diagnosis not present

## 2022-04-01 DIAGNOSIS — Z7901 Long term (current) use of anticoagulants: Secondary | ICD-10-CM | POA: Diagnosis not present

## 2022-04-01 DIAGNOSIS — Z9181 History of falling: Secondary | ICD-10-CM | POA: Diagnosis not present

## 2022-04-01 DIAGNOSIS — N3281 Overactive bladder: Secondary | ICD-10-CM | POA: Diagnosis not present

## 2022-04-01 DIAGNOSIS — E785 Hyperlipidemia, unspecified: Secondary | ICD-10-CM | POA: Diagnosis not present

## 2022-04-02 DIAGNOSIS — N3281 Overactive bladder: Secondary | ICD-10-CM | POA: Diagnosis not present

## 2022-04-02 DIAGNOSIS — Z9181 History of falling: Secondary | ICD-10-CM | POA: Diagnosis not present

## 2022-04-02 DIAGNOSIS — E785 Hyperlipidemia, unspecified: Secondary | ICD-10-CM | POA: Diagnosis not present

## 2022-04-02 DIAGNOSIS — I4891 Unspecified atrial fibrillation: Secondary | ICD-10-CM | POA: Diagnosis not present

## 2022-04-02 DIAGNOSIS — I10 Essential (primary) hypertension: Secondary | ICD-10-CM | POA: Diagnosis not present

## 2022-04-02 DIAGNOSIS — Z471 Aftercare following joint replacement surgery: Secondary | ICD-10-CM | POA: Diagnosis not present

## 2022-04-02 DIAGNOSIS — Z7901 Long term (current) use of anticoagulants: Secondary | ICD-10-CM | POA: Diagnosis not present

## 2022-04-04 DIAGNOSIS — I10 Essential (primary) hypertension: Secondary | ICD-10-CM | POA: Diagnosis not present

## 2022-04-04 DIAGNOSIS — Z9181 History of falling: Secondary | ICD-10-CM | POA: Diagnosis not present

## 2022-04-04 DIAGNOSIS — E785 Hyperlipidemia, unspecified: Secondary | ICD-10-CM | POA: Diagnosis not present

## 2022-04-04 DIAGNOSIS — Z471 Aftercare following joint replacement surgery: Secondary | ICD-10-CM | POA: Diagnosis not present

## 2022-04-04 DIAGNOSIS — N3281 Overactive bladder: Secondary | ICD-10-CM | POA: Diagnosis not present

## 2022-04-04 DIAGNOSIS — I4891 Unspecified atrial fibrillation: Secondary | ICD-10-CM | POA: Diagnosis not present

## 2022-04-04 DIAGNOSIS — Z7901 Long term (current) use of anticoagulants: Secondary | ICD-10-CM | POA: Diagnosis not present

## 2022-04-06 DIAGNOSIS — I10 Essential (primary) hypertension: Secondary | ICD-10-CM | POA: Diagnosis not present

## 2022-04-06 DIAGNOSIS — E785 Hyperlipidemia, unspecified: Secondary | ICD-10-CM | POA: Diagnosis not present

## 2022-04-06 DIAGNOSIS — Z9181 History of falling: Secondary | ICD-10-CM | POA: Diagnosis not present

## 2022-04-06 DIAGNOSIS — Z471 Aftercare following joint replacement surgery: Secondary | ICD-10-CM | POA: Diagnosis not present

## 2022-04-06 DIAGNOSIS — I4891 Unspecified atrial fibrillation: Secondary | ICD-10-CM | POA: Diagnosis not present

## 2022-04-06 DIAGNOSIS — Z7901 Long term (current) use of anticoagulants: Secondary | ICD-10-CM | POA: Diagnosis not present

## 2022-04-06 DIAGNOSIS — N3281 Overactive bladder: Secondary | ICD-10-CM | POA: Diagnosis not present

## 2022-04-09 DIAGNOSIS — I10 Essential (primary) hypertension: Secondary | ICD-10-CM | POA: Diagnosis not present

## 2022-04-09 DIAGNOSIS — N3281 Overactive bladder: Secondary | ICD-10-CM | POA: Diagnosis not present

## 2022-04-09 DIAGNOSIS — E785 Hyperlipidemia, unspecified: Secondary | ICD-10-CM | POA: Diagnosis not present

## 2022-04-09 DIAGNOSIS — Z7901 Long term (current) use of anticoagulants: Secondary | ICD-10-CM | POA: Diagnosis not present

## 2022-04-09 DIAGNOSIS — Z471 Aftercare following joint replacement surgery: Secondary | ICD-10-CM | POA: Diagnosis not present

## 2022-04-09 DIAGNOSIS — I4891 Unspecified atrial fibrillation: Secondary | ICD-10-CM | POA: Diagnosis not present

## 2022-04-09 DIAGNOSIS — Z9181 History of falling: Secondary | ICD-10-CM | POA: Diagnosis not present

## 2022-04-11 DIAGNOSIS — I10 Essential (primary) hypertension: Secondary | ICD-10-CM | POA: Diagnosis not present

## 2022-04-11 DIAGNOSIS — Z7901 Long term (current) use of anticoagulants: Secondary | ICD-10-CM | POA: Diagnosis not present

## 2022-04-11 DIAGNOSIS — E785 Hyperlipidemia, unspecified: Secondary | ICD-10-CM | POA: Diagnosis not present

## 2022-04-11 DIAGNOSIS — I4891 Unspecified atrial fibrillation: Secondary | ICD-10-CM | POA: Diagnosis not present

## 2022-04-11 DIAGNOSIS — Z9181 History of falling: Secondary | ICD-10-CM | POA: Diagnosis not present

## 2022-04-11 DIAGNOSIS — N3281 Overactive bladder: Secondary | ICD-10-CM | POA: Diagnosis not present

## 2022-04-11 DIAGNOSIS — Z471 Aftercare following joint replacement surgery: Secondary | ICD-10-CM | POA: Diagnosis not present

## 2022-04-13 DIAGNOSIS — Z9181 History of falling: Secondary | ICD-10-CM | POA: Diagnosis not present

## 2022-04-13 DIAGNOSIS — I10 Essential (primary) hypertension: Secondary | ICD-10-CM | POA: Diagnosis not present

## 2022-04-13 DIAGNOSIS — I4891 Unspecified atrial fibrillation: Secondary | ICD-10-CM | POA: Diagnosis not present

## 2022-04-13 DIAGNOSIS — Z471 Aftercare following joint replacement surgery: Secondary | ICD-10-CM | POA: Diagnosis not present

## 2022-04-13 DIAGNOSIS — E785 Hyperlipidemia, unspecified: Secondary | ICD-10-CM | POA: Diagnosis not present

## 2022-04-13 DIAGNOSIS — N3281 Overactive bladder: Secondary | ICD-10-CM | POA: Diagnosis not present

## 2022-04-13 DIAGNOSIS — Z7901 Long term (current) use of anticoagulants: Secondary | ICD-10-CM | POA: Diagnosis not present

## 2022-04-16 ENCOUNTER — Ambulatory Visit (INDEPENDENT_AMBULATORY_CARE_PROVIDER_SITE_OTHER): Payer: Medicare HMO

## 2022-04-16 VITALS — Ht 64.0 in | Wt 138.0 lb

## 2022-04-16 DIAGNOSIS — Z0000 Encounter for general adult medical examination without abnormal findings: Secondary | ICD-10-CM

## 2022-04-16 DIAGNOSIS — I4891 Unspecified atrial fibrillation: Secondary | ICD-10-CM | POA: Diagnosis not present

## 2022-04-16 DIAGNOSIS — Z9181 History of falling: Secondary | ICD-10-CM | POA: Diagnosis not present

## 2022-04-16 DIAGNOSIS — N3281 Overactive bladder: Secondary | ICD-10-CM | POA: Diagnosis not present

## 2022-04-16 DIAGNOSIS — E785 Hyperlipidemia, unspecified: Secondary | ICD-10-CM | POA: Diagnosis not present

## 2022-04-16 DIAGNOSIS — Z7901 Long term (current) use of anticoagulants: Secondary | ICD-10-CM | POA: Diagnosis not present

## 2022-04-16 DIAGNOSIS — Z471 Aftercare following joint replacement surgery: Secondary | ICD-10-CM | POA: Diagnosis not present

## 2022-04-16 DIAGNOSIS — I10 Essential (primary) hypertension: Secondary | ICD-10-CM | POA: Diagnosis not present

## 2022-04-16 NOTE — Patient Instructions (Addendum)
[name] , Thank you for taking time to come for your Medicare Wellness Visit. I appreciate your ongoing commitment to your health goals. Please review the following plan we discussed and let me know if I can assist you in the future.   These are the goals we discussed:  Goals      Maintain Healthy Lifestyle     Healthy diet. Stay active.        This is a list of the screening recommended for you and due dates:  Health Maintenance  Topic Date Due   Zoster (Shingles) Vaccine (1 of 2) 07/17/2022*   Flu Shot  09/02/2022*   Tetanus Vaccine  04/17/2023*   Medicare Annual Wellness Visit  04/17/2023   Pneumonia Vaccine  Completed   DEXA scan (bone density measurement)  Completed   HPV Vaccine  Aged Out   Mammogram  Discontinued   COVID-19 Vaccine  Discontinued  *Topic was postponed. The date shown is not the original due date.    Advanced directives: End of life planning; Advance aging; Advanced directives discussed.  Copy of current HCPOA/Living Will requested.    Conditions/risks identified: none new  Next appointment: Follow up in one year for your annual wellness visit    Preventive Care 65 Years and Older, Female Preventive care refers to lifestyle choices and visits with your health care provider that can promote health and wellness. What does preventive care include? A yearly physical exam. This is also called an annual well check. Dental exams once or twice a year. Routine eye exams. Ask your health care provider how often you should have your eyes checked. Personal lifestyle choices, including: Daily care of your teeth and gums. Regular physical activity. Eating a healthy diet. Avoiding tobacco and drug use. Limiting alcohol use. Practicing safe sex. Taking low-dose aspirin every day. Taking vitamin and mineral supplements as recommended by your health care provider. What happens during an annual well check? The services and screenings done by your health care  provider during your annual well check will depend on your age, overall health, lifestyle risk factors, and family history of disease. Counseling  Your health care provider may ask you questions about your: Alcohol use. Tobacco use. Drug use. Emotional well-being. Home and relationship well-being. Sexual activity. Eating habits. History of falls. Memory and ability to understand (cognition). Work and work Astronomer. Reproductive health. Screening  You may have the following tests or measurements: Height, weight, and BMI. Blood pressure. Lipid and cholesterol levels. These may be checked every 5 years, or more frequently if you are over 64 years old. Skin check. Lung cancer screening. You may have this screening every year starting at age 9 if you have a 30-pack-year history of smoking and currently smoke or have quit within the past 15 years. Fecal occult blood test (FOBT) of the stool. You may have this test every year starting at age 70. Flexible sigmoidoscopy or colonoscopy. You may have a sigmoidoscopy every 5 years or a colonoscopy every 10 years starting at age 30. Hepatitis C blood test. Hepatitis B blood test. Sexually transmitted disease (STD) testing. Diabetes screening. This is done by checking your blood sugar (glucose) after you have not eaten for a while (fasting). You may have this done every 1-3 years. Bone density scan. This is done to screen for osteoporosis. You may have this done starting at age 7. Mammogram. This may be done every 1-2 years. Talk to your health care provider about how often you should  have regular mammograms. Talk with your health care provider about your test results, treatment options, and if necessary, the need for more tests. Vaccines  Your health care provider may recommend certain vaccines, such as: Influenza vaccine. This is recommended every year. Tetanus, diphtheria, and acellular pertussis (Tdap, Td) vaccine. You may need a Td  booster every 10 years. Zoster vaccine. You may need this after age 72. Pneumococcal 13-valent conjugate (PCV13) vaccine. One dose is recommended after age 52. Pneumococcal polysaccharide (PPSV23) vaccine. One dose is recommended after age 56. Talk to your health care provider about which screenings and vaccines you need and how often you need them. This information is not intended to replace advice given to you by your health care provider. Make sure you discuss any questions you have with your health care provider. Document Released: 06/17/2015 Document Revised: 02/08/2016 Document Reviewed: 03/22/2015 Elsevier Interactive Patient Education  2017 [place] Prevention in the Home Falls can cause injuries. They can happen to people of all ages. There are many things you can do to make your home safe and to help prevent falls. What can I do on the outside of my home? Regularly fix the edges of walkways and driveways and fix any cracks. Remove anything that might make you trip as you walk through a door, such as a raised step or threshold. Trim any bushes or trees on the path to your home. Use bright outdoor lighting. Clear any walking paths of anything that might make someone trip, such as rocks or tools. Regularly check to see if handrails are loose or broken. Make sure that both sides of any steps have handrails. Any raised decks and porches should have guardrails on the edges. Have any leaves, snow, or ice cleared regularly. Use sand or salt on walking paths during winter. Clean up any spills in your garage right away. This includes oil or grease spills. What can I do in the bathroom? Use night lights. Install grab bars by the toilet and in the tub and shower. Do not use towel bars as grab bars. Use non-skid mats or decals in the tub or shower. If you need to sit down in the shower, use a plastic, non-slip stool. Keep the floor dry. Clean up any water that spills on the floor  as soon as it happens. Remove soap buildup in the tub or shower regularly. Attach bath mats securely with double-sided non-slip rug tape. Do not have throw rugs and other things on the floor that can make you trip. What can I do in the bedroom? Use night lights. Make sure that you have a light by your bed that is easy to reach. Do not use any sheets or blankets that are too big for your bed. They should not hang down onto the floor. Have a firm chair that has side arms. You can use this for support while you get dressed. Do not have throw rugs and other things on the floor that can make you trip. What can I do in the kitchen? Clean up any spills right away. Avoid walking on wet floors. Keep items that you use a lot in easy-to-reach places. If you need to reach something above you, use a strong step stool that has a grab bar. Keep electrical cords out of the way. Do not use floor polish or wax that makes floors slippery. If you must use wax, use non-skid floor wax. Do not have throw rugs and other things on the floor  that can make you trip. What can I do with my stairs? Do not leave any items on the stairs. Make sure that there are handrails on both sides of the stairs and use them. Fix handrails that are broken or loose. Make sure that handrails are as long as the stairways. Check any carpeting to make sure that it is firmly attached to the stairs. Fix any carpet that is loose or worn. Avoid having throw rugs at the top or bottom of the stairs. If you do have throw rugs, attach them to the floor with carpet tape. Make sure that you have a light switch at the top of the stairs and the bottom of the stairs. If you do not have them, ask someone to add them for you. What else can I do to help prevent falls? Wear shoes that: Do not have high heels. Have rubber bottoms. Are comfortable and fit you well. Are closed at the toe. Do not wear sandals. If you use a stepladder: Make sure that it is  fully opened. Do not climb a closed stepladder. Make sure that both sides of the stepladder are locked into place. Ask someone to hold it for you, if possible. Clearly mark and make sure that you can see: Any grab bars or handrails. First and last steps. Where the edge of each step is. Use tools that help you move around (mobility aids) if they are needed. These include: Canes. Walkers. Scooters. Crutches. Turn on the lights when you go into a dark area. Replace any light bulbs as soon as they burn out. Set up your furniture so you have a clear path. Avoid moving your furniture around. If any of your floors are uneven, fix them. If there are any pets around you, be aware of where they are. Review your medicines with your doctor. Some medicines can make you feel dizzy. This can increase your chance of falling. Ask your doctor what other things that you can do to help prevent falls. This information is not intended to replace advice given to you by your health care provider. Make sure you discuss any questions you have with your health care provider. Document Released: 03/17/2009 Document Revised: 10/27/2015 Document Reviewed: 06/25/2014 Elsevier Interactive Patient Education  2017 [employer].

## 2022-04-16 NOTE — Progress Notes (Addendum)
Subjective:   Laurie [name] is a 88 y.o. female who presents for Medicare Annual (Subsequent) preventive examination.  Review of Systems    No ROS.  Medicare Wellness Virtual Visit.  Visual/audio telehealth visit, UTA vital signs.   See social history for additional risk factors.   Cardiac Risk Factors include: advanced age (>61men, >28 women)     Objective:    Today's Vitals   04/16/22 1550  Weight: 138 lb (62.6 kg)  Height: 5\' 4"  (1.626 m)   Body mass index is 23.69 kg/m.     04/16/2022    4:06 PM 04/12/2021    2:22 PM 06/27/2020    6:10 PM 04/11/2020    2:10 PM 01/28/2019    9:11 AM 01/23/2018   10:41 AM 08/29/2016    8:20 AM  Advanced Directives  Does Patient Have a Medical Advance Directive? Yes No No Yes Yes Yes Yes  Type of 08/31/2016 of [place];Living will   Healthcare Power of [place] Power of Attorney Living will Healthcare Power of [place];Living will  Does patient want to make changes to medical advance directive? No - Patient declined   No - Patient declined No - Patient declined No - Patient declined No - Patient declined  Copy of Healthcare Power of Attorney in Chart? No - copy requested   No - copy requested No - copy requested  No - copy requested  Would patient like information on creating a medical advance directive?  No - Patient declined No - Patient declined        Current Medications (verified) Outpatient Encounter Medications as of 04/16/2022  Medication Sig   Calcium Carbonate-Vit D-Min (CALCIUM 1200 PO) Take 1,200 mg by mouth 2 (two) times daily.   chlorpheniramine (CHLOR-TRIMETON) 4 MG tablet Take 4 mg by mouth 2 (two) times daily as needed.   Cholecalciferol (D3-1000 PO) Take 1 tablet by mouth daily.   diclofenac Sodium (VOLTAREN) 1 % GEL    diltiazem (DILT-XR) 180 MG 24 hr capsule TAKE 1 CAPSULE BY MOUTH EVERY DAY   fish oil-omega-3 fatty acids 1000 MG capsule Take 1 g by mouth daily.    lisinopril  (ZESTRIL) 40 MG tablet TAKE 1 TABLET BY MOUTH TWICE A DAY   Rivaroxaban (XARELTO) 15 MG TABS tablet Take 1 tablet (15 mg total) by mouth daily with supper.   rosuvastatin (CRESTOR) 20 MG tablet Take 1 tablet (20 mg total) by mouth 2 (two) times a week.   traMADol (ULTRAM) 50 MG tablet Take 50 mg by mouth every 6 (six) hours as needed. (Patient not taking: Reported on 09/05/2021)   trospium (SANCTURA) 20 MG tablet TAKE 1 TABLET BY MOUTH TWICE A DAY (Patient taking differently: Take 20 mg by mouth at bedtime.)   No facility-administered encounter medications on file as of 04/16/2022.    Allergies (verified) Hydrochlorothiazide, Losartan, Metoprolol, and Triamterene   History: Past Medical History:  Diagnosis Date   Arrhythmia    Hypertension    Past Surgical History:  Procedure Laterality Date   TONSILLECTOMY     Family History  Problem Relation Age of Onset   Kidney disease Mother    Anemia Mother    Heart disease Father 26   Social History   Socioeconomic History   Marital status: Widowed    Spouse name: Not on file   Number of children: Not on file   Years of education: Not on file   Highest education level: Not on file  Occupational History   Not on file  Tobacco Use   Smoking status: Never   Smokeless tobacco: Never  Substance and Sexual Activity   Alcohol use: No   Drug use: No   Sexual activity: Never  Other Topics Concern   Not on file  Social History Narrative   Not on file   Social Determinants of Health   Financial Resource Strain: Low Risk  (04/16/2022)   Overall Financial Resource Strain (CARDIA)    Difficulty of Paying Living Expenses: Not hard at all  Food Insecurity: No Food Insecurity (04/16/2022)   Hunger Vital Sign    Worried About Running Out of Food in the Last Year: Never true    Ran Out of Food in the Last Year: Never true  Transportation Needs: No Transportation Needs (04/16/2022)   PRAPARE - Administrator, Civil Service  (Medical): No    Lack of Transportation (Non-Medical): No  Physical Activity: Sufficiently Active (04/16/2022)   Exercise Vital Sign    Days of Exercise per Week: 5 days    Minutes of Exercise per Session: 30 min  Stress: No Stress Concern Present (04/16/2022)   [employer] of Occupational Health - Occupational Stress Questionnaire    Feeling of Stress : Not at all  Social Connections: Moderately Integrated (04/16/2022)   Social Connection and Isolation Panel [NHANES]    Frequency of Communication with Friends and Family: More than three times a week    Frequency of Social Gatherings with Friends and Family: More than three times a week    Attends Religious Services: More than 4 times per year    Active Member of [employer] or Organizations: Yes    Attends Banker Meetings: More than 4 times per year    Marital Status: Widowed    Tobacco Counseling Counseling given: Not Answered   Clinical Intake:  Pre-visit preparation completed: Yes        Diabetes: No  How often do you need to have someone help you when you read instructions, pamphlets, or other written materials from your doctor or pharmacy?: 1 - Never    Interpreter Needed?: No      Activities of Daily Living    04/16/2022    3:51 PM  In your present state of health, do you have any difficulty performing the following activities:  Hearing? 0  Vision? 0  Difficulty concentrating or making decisions? 0  Walking or climbing stairs? 1  Comment Cane in use when ambulating as needed  Dressing or bathing? 0  Doing errands, shopping? 1  Preparing Food and eating ? N  Using the Toilet? N  In the past six months, have you accidently leaked urine? N  Do you have problems with loss of bowel control? N  Managing your Medications? N  Managing your Finances? N  Housekeeping or managing your Housekeeping? N    Patient Care Team: Sherlene Shams, MD as PCP - General (Internal Medicine) Iran Ouch, MD as PCP - Cardiology (Cardiology)  Indicate any recent Medical Services you may have received from other than [facility] providers in the past year (date may be approximate).     Assessment:   This is a routine wellness examination for Laurie [name].  I connected with  Laurie [name] on 04/16/22 by a audio enabled telemedicine application and verified that I am speaking with the correct person using two identifiers.  Patient Location: Home  Provider Location: Office/Clinic  I discussed the limitations  of evaluation and management by telemedicine. The patient expressed understanding and agreed to proceed.   Hearing/Vision screen Hearing Screening - Comments:: Patient is able to hear conversational tones without difficulty. No issues reported. Vision Screening - Comments:: Followed by Dr. Alvester MorinBell Wears corrective lenses when reading Cataract extraction, bilateral They have regular follow up with the ophthalmologist    Dietary issues and exercise activities discussed: Current Exercise Habits: Home exercise routine, Time (Minutes): 30, Frequency (Times/Week): 5, Weekly Exercise (Minutes/Week): 150, Intensity: Mild   Goals Addressed             This Visit's Progress    Maintain Healthy Lifestyle       Healthy diet. Stay active.       Depression Screen    04/16/2022    3:56 PM 11/20/2021    2:34 PM 04/26/2021    4:05 PM 04/12/2021    2:09 PM 11/10/2020   10:01 AM 07/07/2020   10:23 AM 05/12/2020    9:14 AM  PHQ 2/9 Scores  PHQ - 2 Score 0 0 0 0 0 0 0  PHQ- 9 Score     1      Fall Risk    04/16/2022    3:58 PM 11/20/2021    2:33 PM 06/09/2021    2:25 PM 04/26/2021    4:05 PM 04/12/2021    2:28 PM  Fall Risk   Falls in the past year? 0 0 0 0 0  Number falls in past yr: 0   0 0  Injury with Fall? 0   0 0  Risk for fall due to :  No Fall Risks No Fall Risks    Follow up Falls evaluation completed;Falls prevention discussed Falls evaluation completed Falls evaluation  completed Falls evaluation completed Falls evaluation completed    FALL RISK PREVENTION PERTAINING TO THE HOME: Home free of loose throw rugs in walkways, pet beds, electrical cords, etc? Yes  Adequate lighting in your home to reduce risk of falls? Yes   ASSISTIVE DEVICES UTILIZED TO PREVENT FALLS: Life alert? No  Use of a cane, walker or w/c? Yes  Grab bars in the bathroom? Yes  Shower chair or bench in shower? No  Elevated toilet seat or a handicapped toilet? No   TIMED UP AND GO: Was the test performed? No .   Cognitive Function:    01/23/2018   10:47 AM 08/29/2016    8:40 AM  MMSE - Mini Mental State Exam  Orientation to time 5 5  Orientation to Place 5 5  Registration 3 3  Attention/ Calculation 5 5  Recall 3 3  Language- name 2 objects 2 2  Language- repeat 1 1  Language- follow 3 step command 3 3  Language- read & follow direction 1 1  Write a sentence 1 1  Copy design 1 1  Total score 30 30        04/16/2022    4:12 PM 04/12/2021    2:37 PM 01/28/2019    9:16 AM  6CIT Screen  What Year? 0 points 0 points 0 points  What month? 0 points 0 points 0 points  What time? 0 points 0 points 0 points  Count back from 20 0 points 0 points 0 points  Months in reverse 0 points 0 points 0 points  Repeat phrase 0 points 0 points 0 points  Total Score 0 points 0 points 0 points    Immunizations Immunization History  Administered Date(s) Administered  Pneumococcal Conjugate-13 07/07/2014   Pneumococcal Polysaccharide-23 03/29/2010, 03/26/2016   Tdap 03/29/2010   Zoster, Live 03/10/2010   TDAP status: Due, Education has been provided regarding the importance of this vaccine. Advised may receive this vaccine at local pharmacy or Health Dept. Aware to provide a copy of the vaccination record if obtained from local pharmacy or Health Dept. Verbalized acceptance and understanding.  Flu Vaccine status: Due, Education has been provided regarding the importance of this  vaccine. Advised may receive this vaccine at local pharmacy or Health Dept. Aware to provide a copy of the vaccination record if obtained from local pharmacy or Health Dept. Verbalized acceptance and understanding.  Shingrix Completed?: No.    Education has been provided regarding the importance of this vaccine. Patient has been advised to call insurance company to determine out of pocket expense if they have not yet received this vaccine. Advised may also receive vaccine at local pharmacy or Health Dept. Verbalized acceptance and understanding.  Screening Tests Health Maintenance  Topic Date Due   Zoster Vaccines- Shingrix (1 of 2) 07/17/2022 (Originally 02/28/1981)   INFLUENZA VACCINE  09/02/2022 (Originally 01/02/2022)   TETANUS/TDAP  04/17/2023 (Originally 03/29/2020)   Medicare Annual Wellness (AWV)  04/17/2023   Pneumonia Vaccine 53+ Years old  Completed   DEXA SCAN  Completed   HPV VACCINES  Aged Out   MAMMOGRAM  Discontinued   COVID-19 Vaccine  Discontinued   Health Maintenance There are no preventive care reminders to display for this patient.  Lung Cancer Screening: (Low Dose CT Chest recommended if Age 37-80 years, 30 pack-year currently smoking OR have quit w/in 15years.) does not qualify.   Hepatitis C Screening: does not qualify.  Vision Screening: Recommended annual ophthalmology exams for early detection of glaucoma and other disorders of the eye.  Dental Screening: Recommended annual dental exams for proper oral hygiene.  Community Resource Referral / Chronic Care Management: CRR required this visit?  No   CCM required this visit?  No      Plan:     I have personally reviewed and noted the following in the patient's chart:   Medical and social history Use of alcohol, tobacco or illicit drugs  Current medications and supplements including opioid prescriptions. Patient is not currently taking opioid prescriptions. Functional ability and status Nutritional  status Physical activity Advanced directives List of other physicians Hospitalizations, surgeries, and ER visits in previous 12 months Vitals Screenings to include cognitive, depression, and falls Referrals and appointments  In addition, I have reviewed and discussed with patient certain preventive protocols, quality metrics, and best practice recommendations. A written personalized care plan for preventive services as well as general preventive health recommendations were provided to patient.     Kishia Shackett L Motley, LPN   [id number]     I have reviewed the above information and agree with above.   Duncan Dull, MD

## 2022-04-18 DIAGNOSIS — Z9181 History of falling: Secondary | ICD-10-CM | POA: Diagnosis not present

## 2022-04-18 DIAGNOSIS — E785 Hyperlipidemia, unspecified: Secondary | ICD-10-CM | POA: Diagnosis not present

## 2022-04-18 DIAGNOSIS — I4891 Unspecified atrial fibrillation: Secondary | ICD-10-CM | POA: Diagnosis not present

## 2022-04-18 DIAGNOSIS — Z7901 Long term (current) use of anticoagulants: Secondary | ICD-10-CM | POA: Diagnosis not present

## 2022-04-18 DIAGNOSIS — I10 Essential (primary) hypertension: Secondary | ICD-10-CM | POA: Diagnosis not present

## 2022-04-18 DIAGNOSIS — N3281 Overactive bladder: Secondary | ICD-10-CM | POA: Diagnosis not present

## 2022-04-18 DIAGNOSIS — Z471 Aftercare following joint replacement surgery: Secondary | ICD-10-CM | POA: Diagnosis not present

## 2022-04-20 DIAGNOSIS — Z9181 History of falling: Secondary | ICD-10-CM | POA: Diagnosis not present

## 2022-04-20 DIAGNOSIS — E785 Hyperlipidemia, unspecified: Secondary | ICD-10-CM | POA: Diagnosis not present

## 2022-04-20 DIAGNOSIS — I10 Essential (primary) hypertension: Secondary | ICD-10-CM | POA: Diagnosis not present

## 2022-04-20 DIAGNOSIS — Z471 Aftercare following joint replacement surgery: Secondary | ICD-10-CM | POA: Diagnosis not present

## 2022-04-20 DIAGNOSIS — N3281 Overactive bladder: Secondary | ICD-10-CM | POA: Diagnosis not present

## 2022-04-20 DIAGNOSIS — Z7901 Long term (current) use of anticoagulants: Secondary | ICD-10-CM | POA: Diagnosis not present

## 2022-04-20 DIAGNOSIS — I4891 Unspecified atrial fibrillation: Secondary | ICD-10-CM | POA: Diagnosis not present

## 2022-04-23 DIAGNOSIS — I4891 Unspecified atrial fibrillation: Secondary | ICD-10-CM | POA: Diagnosis not present

## 2022-04-23 DIAGNOSIS — N3281 Overactive bladder: Secondary | ICD-10-CM | POA: Diagnosis not present

## 2022-04-23 DIAGNOSIS — Z9181 History of falling: Secondary | ICD-10-CM | POA: Diagnosis not present

## 2022-04-23 DIAGNOSIS — I10 Essential (primary) hypertension: Secondary | ICD-10-CM | POA: Diagnosis not present

## 2022-04-23 DIAGNOSIS — Z7901 Long term (current) use of anticoagulants: Secondary | ICD-10-CM | POA: Diagnosis not present

## 2022-04-23 DIAGNOSIS — E785 Hyperlipidemia, unspecified: Secondary | ICD-10-CM | POA: Diagnosis not present

## 2022-04-23 DIAGNOSIS — Z471 Aftercare following joint replacement surgery: Secondary | ICD-10-CM | POA: Diagnosis not present

## 2022-04-24 DIAGNOSIS — E785 Hyperlipidemia, unspecified: Secondary | ICD-10-CM | POA: Diagnosis not present

## 2022-04-24 DIAGNOSIS — I4891 Unspecified atrial fibrillation: Secondary | ICD-10-CM | POA: Diagnosis not present

## 2022-04-24 DIAGNOSIS — Z7901 Long term (current) use of anticoagulants: Secondary | ICD-10-CM | POA: Diagnosis not present

## 2022-04-24 DIAGNOSIS — I10 Essential (primary) hypertension: Secondary | ICD-10-CM | POA: Diagnosis not present

## 2022-04-24 DIAGNOSIS — N3281 Overactive bladder: Secondary | ICD-10-CM | POA: Diagnosis not present

## 2022-04-24 DIAGNOSIS — Z471 Aftercare following joint replacement surgery: Secondary | ICD-10-CM | POA: Diagnosis not present

## 2022-04-24 DIAGNOSIS — Z9181 History of falling: Secondary | ICD-10-CM | POA: Diagnosis not present

## 2022-04-27 DIAGNOSIS — I4891 Unspecified atrial fibrillation: Secondary | ICD-10-CM | POA: Diagnosis not present

## 2022-04-27 DIAGNOSIS — Z471 Aftercare following joint replacement surgery: Secondary | ICD-10-CM | POA: Diagnosis not present

## 2022-04-27 DIAGNOSIS — E785 Hyperlipidemia, unspecified: Secondary | ICD-10-CM | POA: Diagnosis not present

## 2022-04-27 DIAGNOSIS — Z7901 Long term (current) use of anticoagulants: Secondary | ICD-10-CM | POA: Diagnosis not present

## 2022-04-27 DIAGNOSIS — N3281 Overactive bladder: Secondary | ICD-10-CM | POA: Diagnosis not present

## 2022-04-27 DIAGNOSIS — I10 Essential (primary) hypertension: Secondary | ICD-10-CM | POA: Diagnosis not present

## 2022-04-27 DIAGNOSIS — Z9181 History of falling: Secondary | ICD-10-CM | POA: Diagnosis not present

## 2022-04-30 DIAGNOSIS — Z471 Aftercare following joint replacement surgery: Secondary | ICD-10-CM | POA: Diagnosis not present

## 2022-04-30 DIAGNOSIS — Z7901 Long term (current) use of anticoagulants: Secondary | ICD-10-CM | POA: Diagnosis not present

## 2022-04-30 DIAGNOSIS — N3281 Overactive bladder: Secondary | ICD-10-CM | POA: Diagnosis not present

## 2022-04-30 DIAGNOSIS — Z9181 History of falling: Secondary | ICD-10-CM | POA: Diagnosis not present

## 2022-04-30 DIAGNOSIS — I4891 Unspecified atrial fibrillation: Secondary | ICD-10-CM | POA: Diagnosis not present

## 2022-04-30 DIAGNOSIS — I10 Essential (primary) hypertension: Secondary | ICD-10-CM | POA: Diagnosis not present

## 2022-04-30 DIAGNOSIS — E785 Hyperlipidemia, unspecified: Secondary | ICD-10-CM | POA: Diagnosis not present

## 2022-05-02 DIAGNOSIS — R2242 Localized swelling, mass and lump, left lower limb: Secondary | ICD-10-CM | POA: Diagnosis not present

## 2022-05-02 DIAGNOSIS — I4891 Unspecified atrial fibrillation: Secondary | ICD-10-CM | POA: Diagnosis not present

## 2022-05-02 DIAGNOSIS — Z471 Aftercare following joint replacement surgery: Secondary | ICD-10-CM | POA: Diagnosis not present

## 2022-05-02 DIAGNOSIS — E785 Hyperlipidemia, unspecified: Secondary | ICD-10-CM | POA: Diagnosis not present

## 2022-05-02 DIAGNOSIS — Z96652 Presence of left artificial knee joint: Secondary | ICD-10-CM | POA: Diagnosis not present

## 2022-05-02 DIAGNOSIS — Z7901 Long term (current) use of anticoagulants: Secondary | ICD-10-CM | POA: Diagnosis not present

## 2022-05-02 DIAGNOSIS — Z9181 History of falling: Secondary | ICD-10-CM | POA: Diagnosis not present

## 2022-05-02 DIAGNOSIS — I10 Essential (primary) hypertension: Secondary | ICD-10-CM | POA: Diagnosis not present

## 2022-05-02 DIAGNOSIS — N3281 Overactive bladder: Secondary | ICD-10-CM | POA: Diagnosis not present

## 2022-05-04 DIAGNOSIS — Z7901 Long term (current) use of anticoagulants: Secondary | ICD-10-CM | POA: Diagnosis not present

## 2022-05-04 DIAGNOSIS — Z471 Aftercare following joint replacement surgery: Secondary | ICD-10-CM | POA: Diagnosis not present

## 2022-05-04 DIAGNOSIS — N3281 Overactive bladder: Secondary | ICD-10-CM | POA: Diagnosis not present

## 2022-05-04 DIAGNOSIS — Z9181 History of falling: Secondary | ICD-10-CM | POA: Diagnosis not present

## 2022-05-04 DIAGNOSIS — I10 Essential (primary) hypertension: Secondary | ICD-10-CM | POA: Diagnosis not present

## 2022-05-04 DIAGNOSIS — E785 Hyperlipidemia, unspecified: Secondary | ICD-10-CM | POA: Diagnosis not present

## 2022-05-04 DIAGNOSIS — I4891 Unspecified atrial fibrillation: Secondary | ICD-10-CM | POA: Diagnosis not present

## 2022-05-08 DIAGNOSIS — I4891 Unspecified atrial fibrillation: Secondary | ICD-10-CM | POA: Diagnosis not present

## 2022-05-08 DIAGNOSIS — I10 Essential (primary) hypertension: Secondary | ICD-10-CM | POA: Diagnosis not present

## 2022-05-08 DIAGNOSIS — E785 Hyperlipidemia, unspecified: Secondary | ICD-10-CM | POA: Diagnosis not present

## 2022-05-08 DIAGNOSIS — Z471 Aftercare following joint replacement surgery: Secondary | ICD-10-CM | POA: Diagnosis not present

## 2022-05-08 DIAGNOSIS — Z9181 History of falling: Secondary | ICD-10-CM | POA: Diagnosis not present

## 2022-05-08 DIAGNOSIS — N3281 Overactive bladder: Secondary | ICD-10-CM | POA: Diagnosis not present

## 2022-05-08 DIAGNOSIS — Z7901 Long term (current) use of anticoagulants: Secondary | ICD-10-CM | POA: Diagnosis not present

## 2022-05-10 DIAGNOSIS — E785 Hyperlipidemia, unspecified: Secondary | ICD-10-CM | POA: Diagnosis not present

## 2022-05-10 DIAGNOSIS — I4891 Unspecified atrial fibrillation: Secondary | ICD-10-CM | POA: Diagnosis not present

## 2022-05-10 DIAGNOSIS — Z9181 History of falling: Secondary | ICD-10-CM | POA: Diagnosis not present

## 2022-05-10 DIAGNOSIS — I10 Essential (primary) hypertension: Secondary | ICD-10-CM | POA: Diagnosis not present

## 2022-05-10 DIAGNOSIS — Z471 Aftercare following joint replacement surgery: Secondary | ICD-10-CM | POA: Diagnosis not present

## 2022-05-10 DIAGNOSIS — N3281 Overactive bladder: Secondary | ICD-10-CM | POA: Diagnosis not present

## 2022-05-10 DIAGNOSIS — Z7901 Long term (current) use of anticoagulants: Secondary | ICD-10-CM | POA: Diagnosis not present

## 2022-05-14 ENCOUNTER — Ambulatory Visit (INDEPENDENT_AMBULATORY_CARE_PROVIDER_SITE_OTHER): Payer: Medicare HMO | Admitting: Internal Medicine

## 2022-05-14 ENCOUNTER — Encounter: Payer: Self-pay | Admitting: Internal Medicine

## 2022-05-14 VITALS — BP 146/88 | HR 98 | Temp 97.5°F | Ht 64.0 in | Wt 133.0 lb

## 2022-05-14 DIAGNOSIS — I700 Atherosclerosis of aorta: Secondary | ICD-10-CM

## 2022-05-14 DIAGNOSIS — Z789 Other specified health status: Secondary | ICD-10-CM | POA: Diagnosis not present

## 2022-05-14 DIAGNOSIS — D6869 Other thrombophilia: Secondary | ICD-10-CM | POA: Diagnosis not present

## 2022-05-14 DIAGNOSIS — I10 Essential (primary) hypertension: Secondary | ICD-10-CM

## 2022-05-14 DIAGNOSIS — I482 Chronic atrial fibrillation, unspecified: Secondary | ICD-10-CM

## 2022-05-14 DIAGNOSIS — E782 Mixed hyperlipidemia: Secondary | ICD-10-CM

## 2022-05-14 DIAGNOSIS — E559 Vitamin D deficiency, unspecified: Secondary | ICD-10-CM | POA: Diagnosis not present

## 2022-05-14 DIAGNOSIS — Z0000 Encounter for general adult medical examination without abnormal findings: Secondary | ICD-10-CM

## 2022-05-14 LAB — CBC WITH DIFFERENTIAL/PLATELET
Basophils Absolute: 0.1 10*3/uL (ref 0.0–0.1)
Basophils Relative: 0.9 % (ref 0.0–3.0)
Eosinophils Absolute: 0.1 10*3/uL (ref 0.0–0.7)
Eosinophils Relative: 2.3 % (ref 0.0–5.0)
HCT: 37.5 % (ref 36.0–46.0)
Hemoglobin: 12.3 g/dL (ref 12.0–15.0)
Lymphocytes Relative: 19 % (ref 12.0–46.0)
Lymphs Abs: 1.2 10*3/uL (ref 0.7–4.0)
MCHC: 32.8 g/dL (ref 30.0–36.0)
MCV: 89.1 fl (ref 78.0–100.0)
Monocytes Absolute: 0.3 10*3/uL (ref 0.1–1.0)
Monocytes Relative: 5.5 % (ref 3.0–12.0)
Neutro Abs: 4.4 10*3/uL (ref 1.4–7.7)
Neutrophils Relative %: 72.3 % (ref 43.0–77.0)
Platelets: 258 10*3/uL (ref 150.0–400.0)
RBC: 4.21 Mil/uL (ref 3.87–5.11)
RDW: 15.7 % — ABNORMAL HIGH (ref 11.5–15.5)
WBC: 6.1 10*3/uL (ref 4.0–10.5)

## 2022-05-14 LAB — LIPID PANEL
Cholesterol: 139 mg/dL (ref 0–200)
HDL: 64.7 mg/dL (ref >39.00)
LDL Cholesterol: 48 mg/dL (ref 0–99)
NonHDL: 74.75
Total CHOL/HDL Ratio: 2
Triglycerides: 133 mg/dL (ref 0.0–149.0)
VLDL: 26.6 mg/dL (ref 0.0–40.0)

## 2022-05-14 LAB — COMPREHENSIVE METABOLIC PANEL
ALT: 10 U/L (ref 0–35)
AST: 16 U/L (ref 0–37)
Albumin: 4.4 g/dL (ref 3.5–5.2)
Alkaline Phosphatase: 75 U/L (ref 39–117)
BUN: 15 mg/dL (ref 6–23)
CO2: 29 mEq/L (ref 19–32)
Calcium: 10 mg/dL (ref 8.4–10.5)
Chloride: 100 mEq/L (ref 96–112)
Creatinine, Ser: 0.72 mg/dL (ref 0.40–1.20)
GFR: 74.24 mL/min (ref >60.00)
Glucose, Bld: 96 mg/dL (ref 70–99)
Potassium: 3.8 mEq/L (ref 3.5–5.1)
Sodium: 137 mEq/L (ref 135–145)
Total Bilirubin: 0.8 mg/dL (ref 0.2–1.2)
Total Protein: 6.8 g/dL (ref 6.0–8.3)

## 2022-05-14 LAB — VITAMIN D 25 HYDROXY (VIT D DEFICIENCY, FRACTURES): VITD: 58.61 ng/mL (ref 30.00–100.00)

## 2022-05-14 LAB — LDL CHOLESTEROL, DIRECT: Direct LDL: 52 mg/dL

## 2022-05-14 NOTE — Assessment & Plan Note (Signed)
Now treated with twice weekly rosuvstatin .  LFTS are normal.  Lab Results  Component Value Date   ALT 19 11/20/2021   AST 19 11/20/2021   ALKPHOS 54 11/20/2021   BILITOT 1.0 11/20/2021

## 2022-05-14 NOTE — Assessment & Plan Note (Signed)
Untreated secondary to age and statin intolerance, but she is now tolerating crestor 2 /weekly since mid June given the finding of AA   Lab Results  Component Value Date   CHOL 139 05/14/2022   HDL 64.70 05/14/2022   LDLCALC 48 05/14/2022   LDLDIRECT 52.0 05/14/2022   TRIG 133.0 05/14/2022   CHOLHDL 2 05/14/2022

## 2022-05-14 NOTE — Assessment & Plan Note (Signed)

## 2022-05-14 NOTE — Assessment & Plan Note (Signed)
She remains rate controlled on Diltiazem CR and has resumed anticoagulation  with Xarelto.  She is asymptomatic with activities

## 2022-05-14 NOTE — Progress Notes (Signed)
Patient ID: Laurie [name], female    DOB: January 29, 1931  Age: 88 y.o. MRN: [id number]  The patient is here for annual preventive examination and management of other chronic and acute problems.   The risk factors are reflected in the social history.  The roster of all physicians providing medical care to patient - is listed in the Snapshot section of the chart.  Activities of daily living:  The patient is 100% independent in all ADLs: dressing, toileting, feeding as well as independent mobility  Home safety : The patient has smoke detectors in the home. They wear seatbelts.  There are no firearms at home. There is no violence in the home.   There is no risks for hepatitis, STDs or HIV. There is no   history of blood transfusion. They have no travel history to infectious disease endemic areas of the world.  The patient has seen their dentist in the last six month. They have seen their eye doctor in the last year. They admit to slight hearing difficulty with regard to whispered voices and some television programs.  They have deferred audiologic testing in the last year.  They do not  have excessive sun exposure. Discussed the need for sun protection: hats, long sleeves and use of sunscreen if there is significant sun exposure.   Diet: the importance of a healthy diet is discussed. They do have a healthy diet.  The benefits of regular aerobic exercise were discussed. She is working with PT after undergoing total knee replacement of left knee In October. .   Depression screen: there are no signs or vegative symptoms of depression- irritability, change in appetite, anhedonia, sadness/tearfullness.  Cognitive assessment: the patient manages all their financial and personal affairs and is actively engaged. They could relate day,date,year and events; recalled 2/3 objects at 3 minutes; performed clock-face test normally.  The following portions of the patient's history were reviewed and updated as  appropriate: allergies, current medications, past family history, past medical history,  past surgical history, past social history  and problem list.  Visual acuity was not assessed per patient preference since she has regular follow up with her ophthalmologist. Hearing and body mass index were assessed and reviewed.   During the course of the visit the patient was educated and counseled about appropriate screening and preventive services including : fall prevention , diabetes screening, nutrition counseling, colorectal cancer screening, and recommended immunizations.    CC: The primary encounter diagnosis was Acquired thrombophilia ([place]). Diagnoses of Mixed hyperlipidemia, Primary hypertension, Aortic atherosclerosis ([place]), Statin intolerance, Vitamin D deficiency, Chronic atrial fibrillation ([place]), and Encounter for preventive health examination were also pertinent to this visit.  1) s/p LKR  Oct by Harlow Mares:   still getting home PT>. Using tylenol prn  . Appetite and energy.    2) HTN:  does not check BP at home,   But PT is checking   3) atrial fib:  xarelto was suspended for TKR by Dr Fletcher Anon 48 hours prior and resume 48 hours after   History Laurie [name] has a past medical history of Arrhythmia and Hypertension.   She has a past surgical history that includes Tonsillectomy and Knee surgery (Left, 03/22/2022).   Her family history includes Anemia in her mother; Heart disease (age of onset: 4) in her father; Kidney disease in her mother.She reports that she has never smoked. She has never used smokeless tobacco. She reports that she does not drink alcohol and does not use drugs.  Outpatient Medications  Prior to Visit  Medication Sig Dispense Refill   Calcium Carbonate-Vit D-Min (CALCIUM 1200 PO) Take 1,200 mg by mouth 2 (two) times daily.     chlorpheniramine (CHLOR-TRIMETON) 4 MG tablet Take 4 mg by mouth 2 (two) times daily as needed.     Cholecalciferol (D3-1000 PO) Take 1 tablet by mouth  daily.     diltiazem (DILT-XR) 180 MG 24 hr capsule TAKE 1 CAPSULE BY MOUTH EVERY DAY 90 capsule 1   fish oil-omega-3 fatty acids 1000 MG capsule Take 1 g by mouth daily.      lisinopril (ZESTRIL) 40 MG tablet TAKE 1 TABLET BY MOUTH TWICE A DAY 180 tablet 0   Rivaroxaban (XARELTO) 15 MG TABS tablet Take 1 tablet (15 mg total) by mouth daily with supper. 90 tablet 3   rosuvastatin (CRESTOR) 20 MG tablet Take 1 tablet (20 mg total) by mouth 2 (two) times a week. 30 tablet 5   trospium (SANCTURA) 20 MG tablet TAKE 1 TABLET BY MOUTH TWICE A DAY (Patient taking differently: Take 20 mg by mouth at bedtime.) 180 tablet 1   diclofenac Sodium (VOLTAREN) 1 % GEL  (Patient not taking: Reported on 05/14/2022)     traMADol (ULTRAM) 50 MG tablet Take 50 mg by mouth every 6 (six) hours as needed. (Patient not taking: Reported on 09/05/2021)     No facility-administered medications prior to visit.    Review of Systems  Patient denies headache, fevers, malaise, unintentional weight loss, skin rash, eye pain, sinus congestion and sinus pain, sore throat, dysphagia,  hemoptysis , cough, dyspnea, wheezing, chest pain, palpitations, orthopnea, edema, abdominal pain, nausea, melena, diarrhea, constipation, flank pain, dysuria, hematuria, urinary  Frequency, nocturia, numbness, tingling, seizures,  Focal weakness, Loss of consciousness,  Tremor, insomnia, depression, anxiety, and suicidal ideation.    Objective:  BP (!) 146/88   Pulse 98   Temp (!) 97.5 F (36.4 C) (Oral)   Ht _0  (1.626 m)   Wt 133 lb (60.3 kg)   SpO2 99%   BMI 22.83 kg/m   Physical Exam  General appearance: alert, cooperative and appears stated age Ears: normal TM's and external ear canals both ears Throat: lips, mucosa, and tongue normal; teeth and gums normal Neck: no adenopathy, no carotid bruit, supple, symmetrical, trachea midline and thyroid not enlarged, symmetric, no tenderness/mass/nodules Back: symmetric, no curvature. ROM  normal. No CVA tenderness. Lungs: clear to auscultation bilaterally Heart: regular rate and rhythm, S1, S2 normal, no murmur, click, rub or gallop Abdomen: soft, non-tender; bowel sounds normal; no masses,  no organomegaly Pulses: 2+ and symmetric Skin: Skin color, texture, turgor normal. No rashes or lesions MSK:  left knee with healing surgical incision  , mild swelling and warmth Lymph nodes: Cervical, supraclavicular, and axillary nodes normal.   Assessment & Plan:   Problem List Items Addressed This Visit     Vitamin D deficiency   Relevant Orders   VITAMIN D 25 Hydroxy (Vit-D Deficiency, Fractures) (Completed)   Statin intolerance    She is tolerating rosuvastatin twice weekly      Hypertension    Previously Well controlled on current regimen of diltiazem and lisinopril.  She prefers to continue ACE Inhibitor over ARB   . Renal function assessment is due , no changes today.  Home readings requested.   Lab Results  Component Value Date   CREATININE 0.72 05/14/2022   Lab Results  Component Value Date   NA 137 05/14/2022   K 3.8 05/14/2022  CL 100 05/14/2022   CO2 29 05/14/2022        Relevant Orders   Comp Met (CMET) (Completed)   Hyperlipidemia    Untreated secondary to age and statin intolerance, but she is now tolerating crestor 2 /weekly since mid June given the finding of AA   Lab Results  Component Value Date   CHOL 139 05/14/2022   HDL 64.70 05/14/2022   LDLCALC 48 05/14/2022   LDLDIRECT 52.0 05/14/2022   TRIG 133.0 05/14/2022   CHOLHDL 2 05/14/2022        Relevant Orders   Lipid Profile (Completed)   Direct LDL (Completed)   Encounter for preventive health examination    age appropriate education and counseling updated, referrals for preventative services and immunizations addressed, dietary and smoking counseling addressed, most recent labs reviewed.  I have personally reviewed and have noted:   1) the patient's medical and social history 2)  The pt's use of alcohol, tobacco, and illicit drugs 3) The patient's current medications and supplements 4) Functional ability including ADL's, fall risk, home safety risk, hearing and visual impairment 5) Diet and physical activities 6) Evidence for depression or mood disorder 7) The patient's height, weight, and BMI have been recorded in the chartI have made referrals, and provided counseling and education based on review of the above       Chronic atrial fibrillation ([place])    She remains rate controlled on Diltiazem CR and has resumed anticoagulation  with Xarelto.  She is asymptomatic with activities       Aortic atherosclerosis ([place])    Now treated with twice weekly rosuvstatin .  LFTS are normal.  Lab Results  Component Value Date   ALT 19 11/20/2021   AST 19 11/20/2021   ALKPHOS 54 11/20/2021   BILITOT 1.0 11/20/2021        Acquired thrombophilia ([place]) - Primary    Secondary to atrial fibrillationMedication was suspended pre knee replacement and resumed 4 hours later. . checking CBC today      Relevant Orders   CBC with Differential/Platelet (Completed)     Follow-up: Return in about 6 months (around 11/13/2022).   Crecencio Mc, MD

## 2022-05-14 NOTE — Assessment & Plan Note (Signed)
Previously Well controlled on current regimen of diltiazem and lisinopril.  She prefers to continue ACE Inhibitor over ARB   . Renal function assessment is due , no changes today.  Home readings requested.   Lab Results  Component Value Date   CREATININE 0.72 05/14/2022   Lab Results  Component Value Date   NA 137 05/14/2022   K 3.8 05/14/2022   CL 100 05/14/2022   CO2 29 05/14/2022

## 2022-05-14 NOTE — Assessment & Plan Note (Signed)
She is tolerating rosuvastatin twice weekly

## 2022-05-14 NOTE — Assessment & Plan Note (Signed)
Secondary to atrial fibrillationMedication was suspended pre knee replacement and resumed 4 hours later. . checking CBC today

## 2022-05-14 NOTE — Patient Instructions (Addendum)
Your blood pressure is too high when you come to the office,  Please Ask the Physical therapist  to send me your home BP readings so I can review  I recommend that you take tylenol BEFORE THE PHYSICAL THERAPY, and you apply ice to your knee AFTER PHYSICAL THERAPY  The swelling in your ankle is due to the changes in your blood vessels from the surgery.  Elevating your foot when you are reclining  will help resolve it for a while, but it will recur daily

## 2022-05-15 ENCOUNTER — Encounter: Payer: Self-pay | Admitting: Internal Medicine

## 2022-05-15 DIAGNOSIS — Z7901 Long term (current) use of anticoagulants: Secondary | ICD-10-CM | POA: Diagnosis not present

## 2022-05-15 DIAGNOSIS — Z471 Aftercare following joint replacement surgery: Secondary | ICD-10-CM | POA: Diagnosis not present

## 2022-05-15 DIAGNOSIS — I4891 Unspecified atrial fibrillation: Secondary | ICD-10-CM | POA: Diagnosis not present

## 2022-05-15 DIAGNOSIS — E785 Hyperlipidemia, unspecified: Secondary | ICD-10-CM | POA: Diagnosis not present

## 2022-05-15 DIAGNOSIS — N3281 Overactive bladder: Secondary | ICD-10-CM | POA: Diagnosis not present

## 2022-05-15 DIAGNOSIS — Z9181 History of falling: Secondary | ICD-10-CM | POA: Diagnosis not present

## 2022-05-15 DIAGNOSIS — I10 Essential (primary) hypertension: Secondary | ICD-10-CM | POA: Diagnosis not present

## 2022-05-17 DIAGNOSIS — Z9181 History of falling: Secondary | ICD-10-CM | POA: Diagnosis not present

## 2022-05-17 DIAGNOSIS — Z471 Aftercare following joint replacement surgery: Secondary | ICD-10-CM | POA: Diagnosis not present

## 2022-05-17 DIAGNOSIS — I10 Essential (primary) hypertension: Secondary | ICD-10-CM | POA: Diagnosis not present

## 2022-05-17 DIAGNOSIS — Z7901 Long term (current) use of anticoagulants: Secondary | ICD-10-CM | POA: Diagnosis not present

## 2022-05-17 DIAGNOSIS — E785 Hyperlipidemia, unspecified: Secondary | ICD-10-CM | POA: Diagnosis not present

## 2022-05-17 DIAGNOSIS — I4891 Unspecified atrial fibrillation: Secondary | ICD-10-CM | POA: Diagnosis not present

## 2022-05-17 DIAGNOSIS — N3281 Overactive bladder: Secondary | ICD-10-CM | POA: Diagnosis not present

## 2022-05-21 DIAGNOSIS — E785 Hyperlipidemia, unspecified: Secondary | ICD-10-CM | POA: Diagnosis not present

## 2022-05-21 DIAGNOSIS — Z9181 History of falling: Secondary | ICD-10-CM | POA: Diagnosis not present

## 2022-05-21 DIAGNOSIS — Z7901 Long term (current) use of anticoagulants: Secondary | ICD-10-CM | POA: Diagnosis not present

## 2022-05-21 DIAGNOSIS — I4891 Unspecified atrial fibrillation: Secondary | ICD-10-CM | POA: Diagnosis not present

## 2022-05-21 DIAGNOSIS — N3281 Overactive bladder: Secondary | ICD-10-CM | POA: Diagnosis not present

## 2022-05-21 DIAGNOSIS — Z471 Aftercare following joint replacement surgery: Secondary | ICD-10-CM | POA: Diagnosis not present

## 2022-05-21 DIAGNOSIS — I10 Essential (primary) hypertension: Secondary | ICD-10-CM | POA: Diagnosis not present

## 2022-05-24 DIAGNOSIS — Z9181 History of falling: Secondary | ICD-10-CM | POA: Diagnosis not present

## 2022-05-24 DIAGNOSIS — E785 Hyperlipidemia, unspecified: Secondary | ICD-10-CM | POA: Diagnosis not present

## 2022-05-24 DIAGNOSIS — Z7901 Long term (current) use of anticoagulants: Secondary | ICD-10-CM | POA: Diagnosis not present

## 2022-05-24 DIAGNOSIS — I10 Essential (primary) hypertension: Secondary | ICD-10-CM | POA: Diagnosis not present

## 2022-05-24 DIAGNOSIS — I4891 Unspecified atrial fibrillation: Secondary | ICD-10-CM | POA: Diagnosis not present

## 2022-05-24 DIAGNOSIS — Z471 Aftercare following joint replacement surgery: Secondary | ICD-10-CM | POA: Diagnosis not present

## 2022-05-24 DIAGNOSIS — N3281 Overactive bladder: Secondary | ICD-10-CM | POA: Diagnosis not present

## 2022-06-11 ENCOUNTER — Other Ambulatory Visit (INDEPENDENT_AMBULATORY_CARE_PROVIDER_SITE_OTHER): Payer: Medicare HMO

## 2022-06-11 ENCOUNTER — Telehealth: Payer: Self-pay | Admitting: Internal Medicine

## 2022-06-11 DIAGNOSIS — R350 Frequency of micturition: Secondary | ICD-10-CM

## 2022-06-11 DIAGNOSIS — N3090 Cystitis, unspecified without hematuria: Secondary | ICD-10-CM

## 2022-06-11 NOTE — Telephone Encounter (Signed)
Spoke with pt and scheduled her for a lab appt this afternoon and video visit on Thursday.

## 2022-06-11 NOTE — Telephone Encounter (Signed)
Pt called stating she thinks she has a UTI because she has been urinating all night. I tried to make an appointment for pt but she wanted the provider to call her something in

## 2022-06-11 NOTE — Telephone Encounter (Signed)
Is it okay for pt to drop off a urine sample?

## 2022-06-12 LAB — URINE CULTURE
MICRO NUMBER:: 14401867
SPECIMEN QUALITY:: ADEQUATE

## 2022-06-12 LAB — URINALYSIS, ROUTINE W REFLEX MICROSCOPIC
Bilirubin Urine: NEGATIVE
Ketones, ur: NEGATIVE
Nitrite: NEGATIVE
Specific Gravity, Urine: 1.01 (ref 1.000–1.030)
Urine Glucose: NEGATIVE
Urobilinogen, UA: 0.2 (ref 0.0–1.0)
pH: 6 (ref 5.0–8.0)

## 2022-06-12 MED ORDER — SULFAMETHOXAZOLE-TRIMETHOPRIM 800-160 MG PO TABS: 1.0000 | ORAL_TABLET | Freq: Two times a day (BID) | ORAL | 0 refills | Status: DC

## 2022-06-12 NOTE — Assessment & Plan Note (Signed)
Empiric treatment with septra given patient's discomfrort

## 2022-06-12 NOTE — Telephone Encounter (Signed)
Spoke with pt to let her know that an abx has been sent in for her to start on today. Pt gave a verbal understanding.

## 2022-06-12 NOTE — Telephone Encounter (Signed)
Pt called wanting an update on her lab results

## 2022-06-12 NOTE — Addendum Note (Signed)
Addended by: Crecencio Mc on: 06/12/2022 04:33 PM   Modules accepted: Orders

## 2022-06-12 NOTE — Telephone Encounter (Signed)
Pt called requesting lab results. Pt is having frequent urination and burning.

## 2022-06-14 ENCOUNTER — Ambulatory Visit (INDEPENDENT_AMBULATORY_CARE_PROVIDER_SITE_OTHER): Payer: Medicare HMO | Admitting: Internal Medicine

## 2022-06-14 ENCOUNTER — Other Ambulatory Visit (HOSPITAL_COMMUNITY)
Admission: RE | Admit: 2022-06-14 | Discharge: 2022-06-14 | Disposition: A | Discharge status: Home / Self Care | Payer: Medicare HMO | Source: Ambulatory Visit | Attending: Internal Medicine | Admitting: Internal Medicine

## 2022-06-14 ENCOUNTER — Encounter: Payer: Self-pay | Admitting: Internal Medicine

## 2022-06-14 VITALS — BP 138/62 | HR 83 | Temp 98.0°F | Resp 15 | Ht 64.0 in | Wt 133.0 lb

## 2022-06-14 DIAGNOSIS — Z4789 Encounter for other orthopedic aftercare: Secondary | ICD-10-CM | POA: Diagnosis not present

## 2022-06-14 DIAGNOSIS — R300 Dysuria: Secondary | ICD-10-CM | POA: Diagnosis not present

## 2022-06-14 DIAGNOSIS — M1712 Unilateral primary osteoarthritis, left knee: Secondary | ICD-10-CM | POA: Diagnosis not present

## 2022-06-14 DIAGNOSIS — Z96652 Presence of left artificial knee joint: Secondary | ICD-10-CM

## 2022-06-14 DIAGNOSIS — N3281 Overactive bladder: Secondary | ICD-10-CM

## 2022-06-14 MED ORDER — ESTRADIOL 0.1 MG/GM VA CREA: TOPICAL_CREAM | VAGINAL | 12 refills | Status: DC

## 2022-06-14 NOTE — Assessment & Plan Note (Signed)
She is recovering well and walking without a walker

## 2022-06-14 NOTE — Patient Instructions (Signed)
Use a small dab of cream (pea sized)  on the end of your finger and apply carefully to your urethra (where your urine comes out).  Do this  at bedtime daily for 2 weeks,  Then reduce use to twice weekly thereafter   If your burning returns you can submit another urine sample any time

## 2022-06-14 NOTE — Progress Notes (Signed)
Subjective:  Patient ID: Laurie [name], female    DOB: December 16, 1930  Age: 89 y.o. MRN: [id number]  CC: The primary encounter diagnosis was Dysuria. Diagnoses of Overactive bladder and S/P total knee arthroplasty, left were also pertinent to this visit.   HPI Laurie [name] presents for evaluation of dysuria  Chief Complaint  Patient presents with   Acute Visit   Urinary Tract Infection   Recent UA and culture negative for infection but positive for WBC's and RBCs' in the setting of dysuria .  Hx:  she reports burning with urination  and frequent urination that started Sunday evening .  She requested an antibotic which she started ,  however the urine culture was negative   She is not sexually active,  does not douche ,  soak in hot tubs or swim.  She has OAB managed with trospium 20 mg bid   Had left knee replacement on Oct 19 by Odis Luster.  Doing well.  Told by PT she that her progress was "exceptional",  not using a walker.      Outpatient Medications Prior to Visit  Medication Sig Dispense Refill   Calcium Carbonate-Vit D-Min (CALCIUM 1200 PO) Take 1,200 mg by mouth 2 (two) times daily.     chlorpheniramine (CHLOR-TRIMETON) 4 MG tablet Take 4 mg by mouth 2 (two) times daily as needed.     Cholecalciferol (D3-1000 PO) Take 1 tablet by mouth daily.     diltiazem (DILT-XR) 180 MG 24 hr capsule TAKE 1 CAPSULE BY MOUTH EVERY DAY 90 capsule 1   fish oil-omega-3 fatty acids 1000 MG capsule Take 1 g by mouth daily.      lisinopril (ZESTRIL) 40 MG tablet TAKE 1 TABLET BY MOUTH TWICE A DAY 180 tablet 0   Rivaroxaban (XARELTO) 15 MG TABS tablet Take 1 tablet (15 mg total) by mouth daily with supper. 90 tablet 3   rosuvastatin (CRESTOR) 20 MG tablet Take 1 tablet (20 mg total) by mouth 2 (two) times a week. 30 tablet 5   sulfamethoxazole-trimethoprim (BACTRIM DS) 800-160 MG tablet Take 1 tablet by mouth 2 (two) times daily. 6 tablet 0   trospium (SANCTURA) 20 MG tablet TAKE 1 TABLET BY MOUTH  TWICE A DAY (Patient taking differently: Take 20 mg by mouth at bedtime.) 180 tablet 1   No facility-administered medications prior to visit.    Review of Systems;  Patient denies headache, fevers, malaise, unintentional weight loss, skin rash, eye pain, sinus congestion and sinus pain, sore throat, dysphagia,  hemoptysis , cough, dyspnea, wheezing, chest pain, palpitations, orthopnea, edema, abdominal pain, nausea, melena, diarrhea, constipation, flank pain, dysuria, hematuria, urinary  Frequency, nocturia, numbness, tingling, seizures,  Focal weakness, Loss of consciousness,  Tremor, insomnia, depression, anxiety, and suicidal ideation.      Objective:  BP 138/62 (BP Location: Left Arm, Patient Position: Sitting, Cuff Size: Small)   Pulse 83   Temp 98 F (36.7 C) (Temporal)   Resp 15   Ht 5\' 4"  (1.626 m)   Wt 133 lb (60.3 kg)   SpO2 97%   BMI 22.83 kg/m   BP Readings from Last 3 Encounters:  06/14/22 138/62  05/14/22 (!) 146/88  03/01/22 (!) 150/70    Wt Readings from Last 3 Encounters:  06/14/22 133 lb (60.3 kg)  05/14/22 133 lb (60.3 kg)  04/16/22 138 lb (62.6 kg)    Physical Exam Vitals reviewed.  Constitutional:      General: She is not in acute  distress.    Appearance: Normal appearance. She is normal weight. She is not ill-appearing, toxic-appearing or diaphoretic.  HENT:     Head: Normocephalic.  Eyes:     General: No scleral icterus.       Right eye: No discharge.        Left eye: No discharge.     Conjunctiva/sclera: Conjunctivae normal.  Cardiovascular:     Rate and Rhythm: Normal rate and regular rhythm.     Heart sounds: Normal heart sounds.  Pulmonary:     Effort: Pulmonary effort is normal. No respiratory distress.     Breath sounds: Normal breath sounds.  Chest:  Breasts:    Breasts are symmetrical.  Abdominal:     Hernia: There is no hernia in the left inguinal area or right inguinal area.  Genitourinary:    General: Normal vulva.     Exam  position: Lithotomy position.     Pubic Area: No rash or pubic lice.      Labia:        Right: No rash, tenderness, lesion or injury.        Left: No rash, tenderness, lesion or injury.      Vagina: Normal. No vaginal discharge.     Cervix: Normal.     Uterus: Normal.      Adnexa: Right adnexa normal and left adnexa normal.     Comments: Atrophic changes to labia minor and vaginal vault.  No discharge  Musculoskeletal:        General: Swelling present. Normal range of motion.     Left lower leg: Edema present.     Comments: Well healed midline surgical excision over left knee.  Mild diffuse swelling without warm or effusion   Lymphadenopathy:     Upper Body:     Right upper body: No supraclavicular, axillary or pectoral adenopathy.     Left upper body: No supraclavicular, axillary or pectoral adenopathy.     Lower Body: No right inguinal adenopathy. No left inguinal adenopathy.  Skin:    General: Skin is warm and dry.  Neurological:     General: No focal deficit present.     Mental Status: She is alert and oriented to person, place, and time. Mental status is at baseline.  Psychiatric:        Mood and Affect: Mood normal.        Behavior: Behavior normal.        Thought Content: Thought content normal.        Judgment: Judgment normal.     Lab Results  Component Value Date   HGBA1C 6.1 11/20/2021   HGBA1C 6.0 05/10/2020   HGBA1C 6.2 11/11/2019    Lab Results  Component Value Date   CREATININE 0.72 05/14/2022   CREATININE 0.82 11/20/2021   CREATININE 0.91 04/26/2021    Lab Results  Component Value Date   WBC 6.1 05/14/2022   HGB 12.3 05/14/2022   HCT 37.5 05/14/2022   PLT 258.0 05/14/2022   GLUCOSE 96 05/14/2022   CHOL 139 05/14/2022   TRIG 133.0 05/14/2022   HDL 64.70 05/14/2022   LDLDIRECT 52.0 05/14/2022   LDLCALC 48 05/14/2022   ALT 10 05/14/2022   AST 16 05/14/2022   NA 137 05/14/2022   K 3.8 05/14/2022   CL 100 05/14/2022   CREATININE 0.72  05/14/2022   BUN 15 05/14/2022   CO2 29 05/14/2022   TSH 1.43 11/20/2021   INR 1.0 11/16/2011   HGBA1C 6.1  11/20/2021   MICROALBUR 14.3 (H) 11/20/2021    US Venous Img Lower Unilateral Left (DVT)  Result Date: 03/26/2022 CLINICAL DATA:  Swelling and pain of the left lower extremity. EXAM: Left LOWER EXTREMITY VENOUS DOPPLER ULTRASOUND TECHNIQUE: Gray-scale sonography with compression, as well as color and duplex ultrasound, were performed to evaluate the deep venous system(s) from the level of the common femoral vein through the popliteal and proximal calf veins. COMPARISON:  September 20, 2016 FINDINGS: VENOUS Normal compressibility of the common femoral, superficial femoral, and popliteal veins, as well as the visualized calf veins. Visualized portions of profunda femoral vein and great saphenous vein unremarkable. No filling defects to suggest DVT on grayscale or color Doppler imaging. Doppler waveforms show normal direction of venous flow, normal respiratory plasticity and response to augmentation. Limited views of the contralateral common femoral vein are unremarkable. OTHER A complex Baker's cyst is identified measuring 5.2 x 1.9 x 2.5 cm stable compared to prior ultrasound. Limitations: none IMPRESSION: Negative for deep venous thrombosis. Stable Baker's cyst. Electronically Signed   By: Abelardo Diesel M.D.   On: 03/26/2022 13:30    Assessment & Plan:  .Dysuria Assessment & Plan: UTI ruled out with culture.  Empiric antibiotics stopped.pelvic exam suggestive of and will treat for atrophic vaginitis .  Ancaillary testing done,   trial of estrogen cream pending  Orders: -     Cervicovaginal ancillary only  Overactive bladder Assessment & Plan: Tolerating higher doses of [place] without dry mouth and constipation.  Continue twice daily dosing    S/P total knee arthroplasty, left Assessment & Plan: She is recovering well and walking without a walker    Other orders -     Estradiol;  Apply a small amount to urethra with fingertip nightly for 2 weeks,  then twice weekly thereafter  Dispense: 42.5 g; Refill: 12     I provided 30 minutes of face-to-face time during this encounter reviewing patient's last visit with me, patient's  most recent A and culture ,  her last visit visit with cardiology,  orthopedics ,  recent surgical and non surgical procedures, previous  labs and imaging studies, counseling on currently addressed issues,  and post visit ordering to diagnostics and therapeutics .   Follow-up: No follow-ups on file.   Crecencio Mc, MD

## 2022-06-14 NOTE — Assessment & Plan Note (Signed)
UTI ruled out with culture.  Empiric antibiotics stopped.pelvic exam suggestive of and will treat for atrophic vaginitis .  Ancaillary testing done,   trial of estrogen cream pending

## 2022-06-14 NOTE — Assessment & Plan Note (Signed)
Tolerating higher doses of Sanct[place] without dry mouth and constipation.  Continue twice daily dosing

## 2022-06-15 LAB — CERVICOVAGINAL ANCILLARY ONLY
Bacterial Vaginitis (gardnerella): NEGATIVE
Candida Glabrata: NEGATIVE
Candida Vaginitis: NEGATIVE
Comment: NEGATIVE
Comment: NEGATIVE
Comment: NEGATIVE
Comment: NEGATIVE
Trichomonas: NEGATIVE

## 2022-07-02 DIAGNOSIS — H353131 Nonexudative age-related macular degeneration, bilateral, early dry stage: Secondary | ICD-10-CM | POA: Diagnosis not present

## 2022-07-02 DIAGNOSIS — R2242 Localized swelling, mass and lump, left lower limb: Secondary | ICD-10-CM | POA: Diagnosis not present

## 2022-07-02 DIAGNOSIS — Z96652 Presence of left artificial knee joint: Secondary | ICD-10-CM | POA: Diagnosis not present

## 2022-07-02 DIAGNOSIS — H524 Presbyopia: Secondary | ICD-10-CM | POA: Diagnosis not present

## 2022-07-24 ENCOUNTER — Other Ambulatory Visit: Payer: Self-pay | Admitting: Internal Medicine

## 2022-08-12 ENCOUNTER — Other Ambulatory Visit: Payer: Self-pay | Admitting: Internal Medicine

## 2022-08-13 DIAGNOSIS — H1132 Conjunctival hemorrhage, left eye: Secondary | ICD-10-CM | POA: Diagnosis not present

## 2022-11-05 ENCOUNTER — Other Ambulatory Visit: Payer: Self-pay | Admitting: Internal Medicine

## 2022-11-14 ENCOUNTER — Ambulatory Visit: Payer: Medicare HMO | Admitting: Internal Medicine

## 2022-11-26 ENCOUNTER — Encounter: Payer: Self-pay | Admitting: Internal Medicine

## 2022-11-26 ENCOUNTER — Ambulatory Visit (INDEPENDENT_AMBULATORY_CARE_PROVIDER_SITE_OTHER): Payer: Medicare HMO | Admitting: Internal Medicine

## 2022-11-26 VITALS — BP 142/74 | HR 77 | Temp 97.8°F | Ht 64.0 in | Wt 130.8 lb

## 2022-11-26 DIAGNOSIS — R7303 Prediabetes: Secondary | ICD-10-CM | POA: Diagnosis not present

## 2022-11-26 DIAGNOSIS — E782 Mixed hyperlipidemia: Secondary | ICD-10-CM

## 2022-11-26 DIAGNOSIS — R5383 Other fatigue: Secondary | ICD-10-CM

## 2022-11-26 DIAGNOSIS — D6869 Other thrombophilia: Secondary | ICD-10-CM

## 2022-11-26 DIAGNOSIS — I482 Chronic atrial fibrillation, unspecified: Secondary | ICD-10-CM

## 2022-11-26 DIAGNOSIS — I10 Essential (primary) hypertension: Secondary | ICD-10-CM | POA: Diagnosis not present

## 2022-11-26 DIAGNOSIS — Z7901 Long term (current) use of anticoagulants: Secondary | ICD-10-CM | POA: Diagnosis not present

## 2022-11-26 DIAGNOSIS — I700 Atherosclerosis of aorta: Secondary | ICD-10-CM | POA: Diagnosis not present

## 2022-11-26 DIAGNOSIS — N3281 Overactive bladder: Secondary | ICD-10-CM | POA: Diagnosis not present

## 2022-11-26 LAB — COMPREHENSIVE METABOLIC PANEL
ALT: 14 U/L (ref 0–35)
AST: 21 U/L (ref 0–37)
Albumin: 4.2 g/dL (ref 3.5–5.2)
Alkaline Phosphatase: 58 U/L (ref 39–117)
BUN: 15 mg/dL (ref 6–23)
CO2: 28 mEq/L (ref 19–32)
Calcium: 10.3 mg/dL (ref 8.4–10.5)
Chloride: 104 mEq/L (ref 96–112)
Creatinine, Ser: 0.87 mg/dL (ref 0.40–1.20)
GFR: 58.94 mL/min — ABNORMAL LOW (ref >60.00)
Glucose, Bld: 103 mg/dL — ABNORMAL HIGH (ref 70–99)
Potassium: 4.4 mEq/L (ref 3.5–5.1)
Sodium: 139 mEq/L (ref 135–145)
Total Bilirubin: 1 mg/dL (ref 0.2–1.2)
Total Protein: 6.8 g/dL (ref 6.0–8.3)

## 2022-11-26 LAB — MICROALBUMIN / CREATININE URINE RATIO
Creatinine,U: 92.1 mg/dL
Microalb Creat Ratio: 2.2 mg/g (ref 0.0–30.0)
Microalb, Ur: 2.1 mg/dL — ABNORMAL HIGH (ref 0.0–1.9)

## 2022-11-26 LAB — CBC WITH DIFFERENTIAL/PLATELET
Basophils Absolute: 0 10*3/uL (ref 0.0–0.1)
Basophils Relative: 0.8 % (ref 0.0–3.0)
Eosinophils Absolute: 0.1 10*3/uL (ref 0.0–0.7)
Eosinophils Relative: 2.6 % (ref 0.0–5.0)
HCT: 41.2 % (ref 36.0–46.0)
Hemoglobin: 13.5 g/dL (ref 12.0–15.0)
Lymphocytes Relative: 26.9 % (ref 12.0–46.0)
Lymphs Abs: 1.3 10*3/uL (ref 0.7–4.0)
MCHC: 32.8 g/dL (ref 30.0–36.0)
MCV: 93.4 fl (ref 78.0–100.0)
Monocytes Absolute: 0.3 10*3/uL (ref 0.1–1.0)
Monocytes Relative: 6.3 % (ref 3.0–12.0)
Neutro Abs: 3.1 10*3/uL (ref 1.4–7.7)
Neutrophils Relative %: 63.4 % (ref 43.0–77.0)
Platelets: 174 10*3/uL (ref 150.0–400.0)
RBC: 4.41 Mil/uL (ref 3.87–5.11)
RDW: 14.7 % (ref 11.5–15.5)
WBC: 4.9 10*3/uL (ref 4.0–10.5)

## 2022-11-26 LAB — LIPID PANEL
Cholesterol: 133 mg/dL (ref 0–200)
HDL: 63.6 mg/dL (ref >39.00)
LDL Cholesterol: 56 mg/dL (ref 0–99)
NonHDL: 69.71
Total CHOL/HDL Ratio: 2
Triglycerides: 71 mg/dL (ref 0.0–149.0)
VLDL: 14.2 mg/dL (ref 0.0–40.0)

## 2022-11-26 LAB — LDL CHOLESTEROL, DIRECT: Direct LDL: 54 mg/dL

## 2022-11-26 LAB — TSH: TSH: 1.89 u[IU]/mL (ref 0.35–5.50)

## 2022-11-26 LAB — HEMOGLOBIN A1C: Hgb A1c MFr Bld: 5.9 % (ref 4.6–6.5)

## 2022-11-26 NOTE — Progress Notes (Signed)
Subjective:  Patient ID: Laurie [name], female    DOB: September 15, 1930  Age: 89 y.o. MRN: [id number]  CC: The primary encounter diagnosis was Primary hypertension. Diagnoses of Mixed hyperlipidemia, Other fatigue, Prediabetes, Long term (current) use of anticoagulants, Acquired thrombophilia (HCC), Aortic atherosclerosis (HCC), Chronic atrial fibrillation (HCC), and Overactive bladder were also pertinent to this visit.   HPI Laurie [name] presents for  Chief Complaint  Patient presents with   Medical Management of Chronic Issues    6 month follow up    Doing well post left TKR in october.  Not Walking regularly, yet,   is careful to walk on paved areas,  no falls.     She is hosting her granddaughter's wedding shower this weekend.  She is busy in preparation   OAB:  taking trospium   Constipation,  new onset since starting trospium   Insomnia:  Tossing and turning at night with the heat   defers medication   rises at 8  on volunteer days   later when not    Outpatient Medications Prior to Visit  Medication Sig Dispense Refill   Calcium Carbonate-Vit D-Min (CALCIUM 1200 PO) Take 1,200 mg by mouth 2 (two) times daily.     chlorpheniramine (CHLOR-TRIMETON) 4 MG tablet Take 4 mg by mouth 2 (two) times daily as needed.     Cholecalciferol (D3-1000 PO) Take 1 tablet by mouth daily.     diltiazem (DILT-XR) 180 MG 24 hr capsule TAKE 1 CAPSULE BY MOUTH EVERY DAY 90 capsule 1   estradiol (ESTRACE) 0.1 MG/GM vaginal cream Apply a small amount to urethra with fingertip nightly for 2 weeks,  then twice weekly thereafter 42.5 g 12   fish oil-omega-3 fatty acids 1000 MG capsule Take 1 g by mouth daily.      lisinopril (ZESTRIL) 40 MG tablet TAKE 1 TABLET BY MOUTH TWICE A DAY 180 tablet 1   Rivaroxaban (XARELTO) 15 MG TABS tablet Take 1 tablet (15 mg total) by mouth daily with supper. 90 tablet 3   rosuvastatin (CRESTOR) 20 MG tablet TAKE 1 TABLET (20 MG TOTAL) BY MOUTH 2 TIMES A WEEK 26 tablet 6    trospium (SANCTURA) 20 MG tablet TAKE 1 TABLET BY MOUTH TWICE A DAY 180 tablet 1   sulfamethoxazole-trimethoprim (BACTRIM DS) 800-160 MG tablet Take 1 tablet by mouth 2 (two) times daily. (Patient not taking: Reported on 11/26/2022) 6 tablet 0   No facility-administered medications prior to visit.    Review of Systems;  Patient denies headache, fevers, malaise, unintentional weight loss, skin rash, eye pain, sinus congestion and sinus pain, sore throat, dysphagia,  hemoptysis , cough, dyspnea, wheezing, chest pain, palpitations, orthopnea, edema, abdominal pain, nausea, melena, diarrhea, constipation, flank pain, dysuria, hematuria, urinary  Frequency, nocturia, numbness, tingling, seizures,  Focal weakness, Loss of consciousness,  Tremor, insomnia, depression, anxiety, and suicidal ideation.      Objective:  BP (!) 142/74   Pulse 77   Temp 97.8 F (36.6 C) (Oral)   Ht 5\' 4"  (1.626 m)   Wt 130 lb 12.8 oz (59.3 kg)   SpO2 99%   BMI 22.45 kg/m   BP Readings from Last 3 Encounters:  11/26/22 (!) 142/74  06/14/22 138/62  05/14/22 (!) 146/88    Wt Readings from Last 3 Encounters:  11/26/22 130 lb 12.8 oz (59.3 kg)  06/14/22 133 lb (60.3 kg)  05/14/22 133 lb (60.3 kg)    Physical Exam Vitals reviewed.  Constitutional:  General: She is not in acute distress.    Appearance: Normal appearance. She is normal weight. She is not ill-appearing, toxic-appearing or diaphoretic.  HENT:     Head: Normocephalic.  Eyes:     General: No scleral icterus.       Right eye: No discharge.        Left eye: No discharge.     Conjunctiva/sclera: Conjunctivae normal.  Cardiovascular:     Rate and Rhythm: Normal rate and regular rhythm.     Heart sounds: Normal heart sounds.  Pulmonary:     Effort: Pulmonary effort is normal. No respiratory distress.     Breath sounds: Normal breath sounds.  Musculoskeletal:        General: Normal range of motion.  Skin:    General: Skin is warm and  dry.  Neurological:     General: No focal deficit present.     Mental Status: She is alert and oriented to person, place, and time. Mental status is at baseline.  Psychiatric:        Mood and Affect: Mood normal.        Behavior: Behavior normal.        Thought Content: Thought content normal.        Judgment: Judgment normal.    Lab Results  Component Value Date   HGBA1C 5.9 11/26/2022   HGBA1C 6.1 11/20/2021   HGBA1C 6.0 05/10/2020    Lab Results  Component Value Date   CREATININE 0.87 11/26/2022   CREATININE 0.72 05/14/2022   CREATININE 0.82 11/20/2021    Lab Results  Component Value Date   WBC 4.9 11/26/2022   HGB 13.5 11/26/2022   HCT 41.2 11/26/2022   PLT 174.0 11/26/2022   GLUCOSE 103 (H) 11/26/2022   CHOL 133 11/26/2022   TRIG 71.0 11/26/2022   HDL 63.60 11/26/2022   LDLDIRECT 54.0 11/26/2022   LDLCALC 56 11/26/2022   ALT 14 11/26/2022   AST 21 11/26/2022   NA 139 11/26/2022   K 4.4 11/26/2022   CL 104 11/26/2022   CREATININE 0.87 11/26/2022   BUN 15 11/26/2022   CO2 28 11/26/2022   TSH 1.89 11/26/2022   INR 1.0 11/16/2011   HGBA1C 5.9 11/26/2022   MICROALBUR 2.1 (H) 11/26/2022    No results found.  Assessment & Plan:  .Primary hypertension Assessment & Plan: Previously Well controlled on current regimen of diltiazem and lisinopril.  She prefers to continue ACE Inhibitor over ARB   . Renal function assessment is due , no changes today.  Home readings requested.   Lab Results  Component Value Date   CREATININE 0.72 05/14/2022   Lab Results  Component Value Date   NA 137 05/14/2022   K 3.8 05/14/2022   CL 100 05/14/2022   CO2 29 05/14/2022     Orders: -     Comprehensive metabolic panel -     Microalbumin / creatinine urine ratio  Mixed hyperlipidemia -     Lipid panel -     LDL cholesterol, direct -     TSH  Other fatigue  Prediabetes -     Hemoglobin A1c  Long term (current) use of anticoagulants -     CBC with  Differential/Platelet  Acquired thrombophilia [facility]) Assessment & Plan: Suspended xarelto for knee replacement Oct 2023. secondary to atrial fibrillation.  She is tolerating use of xarelto for embolic stroke risk mitigation due to  atrial fibrillation. Patient has no signs of bleeding and is advised  to notify her specialists prior to any procedure that may required suspension    Aortic atherosclerosis (HCC) Assessment & Plan:  treated with twice weekly rosuvstatin .  LFTS are normal.  Lab Results  Component Value Date   ALT 10 05/14/2022   AST 16 05/14/2022   ALKPHOS 75 05/14/2022   BILITOT 0.8 05/14/2022      Chronic atrial fibrillation (HCC) Assessment & Plan: She remains rate controlled on Diltiazem CR and has  tolerating  with Xarelto.  She is asymptomatic with activities    Overactive bladder Assessment & Plan: Tolerating higher doses of Sanctura without dry mouth but with mild constipation.  Continue twice daily dosing       I provided 31 minutes of face-to-face time during this encounter reviewing patient's last visit with me, patient's  most recent visit with cardiology, recent surgical and non surgical procedures, previous  labs and imaging studies, counseling on currently addressed issues,  and post visit ordering to diagnostics and therapeutics .   Follow-up: Return in about 6 months (around 05/28/2023).   Sherlene Shams, MD

## 2022-11-26 NOTE — Assessment & Plan Note (Signed)
Suspended xarelto for knee replacement Oct 2023. secondary to atrial fibrillation.  She is tolerating use of xarelto for embolic stroke risk mitigation due to  atrial fibrillation. Patient has no signs of bleeding and is advised to notify her specialists prior to any procedure that may required suspension

## 2022-11-26 NOTE — Assessment & Plan Note (Signed)
Tolerating higher doses of Sanctura without dry mouth but with mild constipation.  Continue twice daily dosing

## 2022-11-26 NOTE — Assessment & Plan Note (Signed)
treated with twice weekly rosuvstatin .  LFTS are normal.  Lab Results  Component Value Date   ALT 10 05/14/2022   AST 16 05/14/2022   ALKPHOS 75 05/14/2022   BILITOT 0.8 05/14/2022

## 2022-11-26 NOTE — Patient Instructions (Addendum)
I recommend going for a walk daily  (in the morning,  pick  a paved route)  to keep your legs strong .  Start with 15 minutes.  Work your way up to 30 minutes.    I recommend taking Colace fits.  (Docusate)  a stool softener.     The ShingRx vaccine is now available  for free at your pharmacy and is ADVISED for all interested adults over 50 to prevent shingles.  PLEASE PLAN TO FEEL "UNDER THE WEATHER "  FOR TWO DAYS AFTER EACH DOSE (FLU LIKE SYMPTOMS )

## 2022-11-26 NOTE — Assessment & Plan Note (Signed)
She remains rate controlled on Diltiazem CR and has  tolerating  with Xarelto.  She is asymptomatic with activities

## 2022-11-26 NOTE — Assessment & Plan Note (Signed)
Previously Well controlled on current regimen of diltiazem and lisinopril.  She prefers to continue ACE Inhibitor over ARB   . Renal function assessment is due , no changes today.  Home readings requested.   Lab Results  Component Value Date   CREATININE 0.72 05/14/2022   Lab Results  Component Value Date   NA 137 05/14/2022   K 3.8 05/14/2022   CL 100 05/14/2022   CO2 29 05/14/2022    

## 2022-11-27 ENCOUNTER — Telehealth: Payer: Self-pay

## 2022-11-27 NOTE — Telephone Encounter (Signed)
-----   Message from Sherlene Shams, MD sent at 11/27/2022  1:19 PM EDT ----- Your CBC,  A1c,  cholesterol,  liver and kidney function have been reviewed and there are no significant or worrisome findings .  Please continue your current medications and  plan to repeat  Non fasting labs in 6 months.   Regards,  Dr. Darrick Huntsman

## 2022-11-27 NOTE — Telephone Encounter (Signed)
LMTCB in regards to lab results.  

## 2022-11-29 NOTE — Telephone Encounter (Signed)
Pt returned [place] CMA call. Note below was read to her regarding results. Pt aware and understood. Pt stated that she will have her lab done on the same day she will see provider in 6 months.

## 2022-11-29 NOTE — Telephone Encounter (Signed)
noted 

## 2022-12-07 NOTE — Telephone Encounter (Signed)
noted 

## 2022-12-07 NOTE — Telephone Encounter (Signed)
Patent called and note was read.

## 2023-01-07 DIAGNOSIS — H353131 Nonexudative age-related macular degeneration, bilateral, early dry stage: Secondary | ICD-10-CM | POA: Diagnosis not present

## 2023-03-07 ENCOUNTER — Other Ambulatory Visit: Payer: Self-pay | Admitting: *Deleted

## 2023-03-07 ENCOUNTER — Other Ambulatory Visit: Payer: Self-pay | Admitting: Internal Medicine

## 2023-03-07 DIAGNOSIS — I482 Chronic atrial fibrillation, unspecified: Secondary | ICD-10-CM

## 2023-03-07 MED ORDER — RIVAROXABAN 15 MG PO TABS: 15.0000 mg | ORAL_TABLET | Freq: Every day | ORAL | 1 refills | Status: DC

## 2023-03-07 NOTE — Telephone Encounter (Signed)
Xarelto 15mg  refill request received. Pt is 89 years old, weight-59.3kg, Crea-0.87 on 11/26/22, last seen by Eula Listen on 03/01/22, Diagnosis-Afib, CrCl-40.23 mL/min; Dose is appropriate based on dosing criteria. Will send in refill to requested pharmacy.

## 2023-04-01 DIAGNOSIS — R2242 Localized swelling, mass and lump, left lower limb: Secondary | ICD-10-CM | POA: Diagnosis not present

## 2023-04-01 DIAGNOSIS — Z96652 Presence of left artificial knee joint: Secondary | ICD-10-CM | POA: Diagnosis not present

## 2023-04-22 ENCOUNTER — Ambulatory Visit (INDEPENDENT_AMBULATORY_CARE_PROVIDER_SITE_OTHER): Payer: Medicare HMO | Admitting: *Deleted

## 2023-04-22 VITALS — Ht 64.0 in | Wt 130.0 lb

## 2023-04-22 DIAGNOSIS — Z0000 Encounter for general adult medical examination without abnormal findings: Secondary | ICD-10-CM | POA: Diagnosis not present

## 2023-04-22 NOTE — Patient Instructions (Signed)
Ms. Murano , Thank you for taking time to come for your Medicare Wellness Visit. I appreciate your ongoing commitment to your health goals. Please review the following plan we discussed and let me know if I can assist you in the future.   Referrals/Orders/Follow-Ups/Clinician Recommendations: Remember to update your vaccines  This is a list of the screening recommended for you and due dates:  Health Maintenance  Topic Date Due   Zoster (Shingles) Vaccine (1 of 2) 02/28/1983   Flu Shot  01/03/2023   Medicare Annual Wellness Visit  04/21/2024   Pneumonia Vaccine  Completed   DEXA scan (bone density measurement)  Completed   HPV Vaccine  Aged Out   DTaP/Tdap/Td vaccine  Discontinued   Mammogram  Discontinued   COVID-19 Vaccine  Discontinued    Advanced directives: (Copy Requested) Please bring a copy of your health care power of attorney and living will to the office to be added to your chart at your convenience.  Next Medicare Annual Wellness Visit scheduled for next year: Yes 04/24/24 @ 8:50

## 2023-04-22 NOTE — Progress Notes (Signed)
Subjective:   Laurie [name] is a 89 y.o. female who presents for Medicare Annual (Subsequent) preventive examination.  Visit Complete: Virtual I connected with  ANTRONETTE SUGUITAN on 04/22/23 by a audio enabled telemedicine application and verified that I am speaking with the correct person using two identifiers.  Patient Location: Home  Provider Location: Office/Clinic  I discussed the limitations of evaluation and management by telemedicine. The patient expressed understanding and agreed to proceed.  Vital Signs: Because this visit was a virtual/telehealth visit, some criteria may be missing or patient reported. Any vitals not documented were not able to be obtained and vitals that have been documented are patient reported.  Cardiac Risk Factors include: advanced age (>21men, >37 women);dyslipidemia;hypertension;Other (see comment), Risk factor comments: AFib     Objective:    Today's Vitals   04/22/23 1033 04/22/23 1034  Weight: 130 lb (59 kg)   Height: 5\' 4"  (1.626 m)   PainSc:  0-No pain   Body mass index is 22.31 kg/m.     04/22/2023   10:50 AM 04/16/2022    4:06 PM 04/12/2021    2:22 PM 06/27/2020    6:10 PM 04/11/2020    2:10 PM 01/28/2019    9:11 AM 01/23/2018   10:41 AM  Advanced Directives  Does Patient Have a Medical Advance Directive? No Yes No No Yes Yes Yes  Type of Furniture conservator/restorer;Living will   Healthcare Power of [employer] Power of Attorney Living will  Does patient want to make changes to medical advance directive?  No - Patient declined   No - Patient declined No - Patient declined No - Patient declined  Copy of Healthcare Power of Attorney in Chart?  No - copy requested   No - copy requested No - copy requested   Would patient like information on creating a medical advance directive? No - Patient declined  No - Patient declined No - Patient declined       Current Medications (verified) Outpatient Encounter  Medications as of 04/22/2023  Medication Sig   Calcium Carbonate-Vit D-Min (CALCIUM 1200 PO) Take 1,200 mg by mouth 2 (two) times daily.   chlorpheniramine (CHLOR-TRIMETON) 4 MG tablet Take 4 mg by mouth 2 (two) times daily as needed.   Cholecalciferol (D3-1000 PO) Take 1 tablet by mouth daily.   diltiazem (DILT-XR) 180 MG 24 hr capsule TAKE 1 CAPSULE BY MOUTH EVERY DAY   estradiol (ESTRACE) 0.1 MG/GM vaginal cream Apply a small amount to urethra with fingertip nightly for 2 weeks,  then twice weekly thereafter   fish oil-omega-3 fatty acids 1000 MG capsule Take 1 g by mouth daily.    lisinopril (ZESTRIL) 40 MG tablet TAKE 1 TABLET BY MOUTH TWICE A DAY   Multiple Vitamins-Minerals (PRESERVISION AREDS 2 PO) Take by mouth. Take one by mouth daily   Rivaroxaban (XARELTO) 15 MG TABS tablet Take 1 tablet (15 mg total) by mouth daily with supper.   rosuvastatin (CRESTOR) 20 MG tablet TAKE 1 TABLET (20 MG TOTAL) BY MOUTH 2 TIMES A WEEK   trospium (SANCTURA) 20 MG tablet TAKE 1 TABLET BY MOUTH TWICE A DAY   No facility-administered encounter medications on file as of 04/22/2023.    Allergies (verified) Hydrochlorothiazide, Losartan, Metoprolol, and Triamterene   History: Past Medical History:  Diagnosis Date   Arrhythmia    Hypertension    Past Surgical History:  Procedure Laterality Date   KNEE SURGERY Left 03/22/2022  TONSILLECTOMY     Family History  Problem Relation Age of Onset   Kidney disease Mother    Anemia Mother    Heart disease Father 3   Social History   Socioeconomic History   Marital status: Widowed    Spouse name: Not on file   Number of children: Not on file   Years of education: Not on file   Highest education level: Not on file  Occupational History   Not on file  Tobacco Use   Smoking status: Never   Smokeless tobacco: Never  Substance and Sexual Activity   Alcohol use: No   Drug use: No   Sexual activity: Never  Other Topics Concern   Not on file   Social History Narrative   Not on file   Social Determinants of Health   Financial Resource Strain: Low Risk  (04/22/2023)   Overall Financial Resource Strain (CARDIA)    Difficulty of Paying Living Expenses: Not hard at all  Food Insecurity: No Food Insecurity (04/22/2023)   Hunger Vital Sign    Worried About Running Out of Food in the Last Year: Never true    Ran Out of Food in the Last Year: Never true  Transportation Needs: No Transportation Needs (04/22/2023)   PRAPARE - Administrator, Civil Service (Medical): No    Lack of Transportation (Non-Medical): No  Physical Activity: Inactive (04/22/2023)   Exercise Vital Sign    Days of Exercise per Week: 0 days    Minutes of Exercise per Session: 0 min  Stress: No Stress Concern Present (04/22/2023)   [employer] of Occupational Health - Occupational Stress Questionnaire    Feeling of Stress : Not at all  Social Connections: Moderately Integrated (04/22/2023)   Social Connection and Isolation Panel [NHANES]    Frequency of Communication with Friends and Family: Three times a week    Frequency of Social Gatherings with Friends and Family: Three times a week    Attends Religious Services: More than 4 times per year    Active Member of Clubs or Organizations: Yes    Attends Banker Meetings: More than 4 times per year    Marital Status: Widowed    Tobacco Counseling Counseling given: Not Answered   Clinical Intake:  Pre-visit preparation completed: Yes  Pain : No/denies pain Pain Score: 0-No pain     BMI - recorded: 22.31 Nutritional Status: BMI of 19-24  Normal Nutritional Risks: None Diabetes: No  How often do you need to have someone help you when you read instructions, pamphlets, or other written materials from your doctor or pharmacy?: 1 - Never  Interpreter Needed?: No  Information entered by :: R. Jaysean Manville LPN   Activities of Daily Living    04/22/2023   10:37 AM  In your  present state of health, do you have any difficulty performing the following activities:  Hearing? 0  Vision? 0  Comment glasses  Difficulty concentrating or making decisions? 1  Comment at times  Walking or climbing stairs? 0  Dressing or bathing? 0  Doing errands, shopping? 0  Preparing Food and eating ? N  Using the Toilet? N  In the past six months, have you accidently leaked urine? N  Do you have problems with loss of bowel control? N  Managing your Medications? N  Managing your Finances? N  Housekeeping or managing your Housekeeping? N    Patient Care Team: Sherlene Shams, MD as PCP - General (  Internal Medicine) Iran Ouch, MD as PCP - Cardiology (Cardiology)  Indicate any recent Medical Services you may have received from other than [facility] providers in the past year (date may be approximate).     Assessment:   This is a routine wellness examination for Aaima.  Hearing/Vision screen Hearing Screening - Comments:: No issues Vision Screening - Comments:: glasses   Goals Addressed             This Visit's Progress    Patient Stated       Wants to continue to stay active       Depression Screen    04/22/2023   10:44 AM 11/26/2022    9:54 AM 06/14/2022   12:38 PM 05/14/2022    9:34 AM 04/16/2022    3:56 PM 11/20/2021    2:34 PM 04/26/2021    4:05 PM  PHQ 2/9 Scores  PHQ - 2 Score 0 0 0 0 0 0 0  PHQ- 9 Score 1          Fall Risk    04/22/2023   10:40 AM 11/26/2022    9:54 AM 06/14/2022   12:38 PM 05/14/2022    9:34 AM 04/16/2022    3:58 PM  Fall Risk   Falls in the past year? 0 0 0 0 0  Number falls in past yr: 0 0 0  0  Injury with Fall? 0 0 0  0  Risk for fall due to : No Fall Risks No Fall Risks No Fall Risks No Fall Risks   Follow up Falls prevention discussed;Falls evaluation completed Falls evaluation completed Falls evaluation completed Falls evaluation completed Falls evaluation completed;Falls prevention discussed    MEDICARE RISK  AT HOME: Medicare Risk at Home Any stairs in or around the home?: Yes If so, are there any without handrails?: No Home free of loose throw rugs in walkways, pet beds, electrical cords, etc?: Yes Adequate lighting in your home to reduce risk of falls?: Yes Life alert?: No Use of a cane, walker or w/c?: No Grab bars in the bathroom?: Yes Shower chair or bench in shower?: No Elevated toilet seat or a handicapped toilet?: No   Cognitive Function:    01/23/2018   10:47 AM 08/29/2016    8:40 AM  MMSE - Mini Mental State Exam  Orientation to time 5 5  Orientation to Place 5 5  Registration 3 3  Attention/ Calculation 5 5  Recall 3 3  Language- name 2 objects 2 2  Language- repeat 1 1  Language- follow 3 step command 3 3  Language- read & follow direction 1 1  Write a sentence 1 1  Copy design 1 1  Total score 30 30        04/22/2023   10:52 AM 04/16/2022    4:12 PM 04/12/2021    2:37 PM 01/28/2019    9:16 AM  6CIT Screen  What Year? 0 points 0 points 0 points 0 points  What month? 0 points 0 points 0 points 0 points  What time? 0 points 0 points 0 points 0 points  Count back from 20 0 points 0 points 0 points 0 points  Months in reverse 0 points 0 points 0 points 0 points  Repeat phrase 2 points 0 points 0 points 0 points  Total Score 2 points 0 points 0 points 0 points    Immunizations Immunization History  Administered Date(s) Administered   Pneumococcal Conjugate-13 07/07/2014   Pneumococcal  Polysaccharide-23 03/29/2010, 03/26/2016   Tdap 03/29/2010   Zoster, Live 03/10/2010    TDAP status: Due, Education has been provided regarding the importance of this vaccine. Advised may receive this vaccine at local pharmacy or Health Dept. Aware to provide a copy of the vaccination record if obtained from local pharmacy or Health Dept. Verbalized acceptance and understanding.  Flu Vaccine status: Declined, Education has been provided regarding the importance of this vaccine  but patient still declined. Advised may receive this vaccine at local pharmacy or Health Dept. Aware to provide a copy of the vaccination record if obtained from local pharmacy or Health Dept. Verbalized acceptance and understanding.  Pneumococcal vaccine status: Up to date  Covid-19 vaccine status: Declined, Education has been provided regarding the importance of this vaccine but patient still declined. Advised may receive this vaccine at local pharmacy or Health Dept.or vaccine clinic. Aware to provide a copy of the vaccination record if obtained from local pharmacy or Health Dept. Verbalized acceptance and understanding.  Qualifies for Shingles Vaccine? Yes   Zostavax completed Yes   Shingrix Completed?: No.    Education has been provided regarding the importance of this vaccine. Patient has been advised to call insurance company to determine out of pocket expense if they have not yet received this vaccine. Advised may also receive vaccine at local pharmacy or Health Dept. Verbalized acceptance and understanding.  Screening Tests Health Maintenance  Topic Date Due   Zoster Vaccines- Shingrix (1 of 2) 02/28/1983   INFLUENZA VACCINE  01/03/2023   Medicare Annual Wellness (AWV)  04/17/2023   Pneumonia Vaccine 69+ Years old  Completed   DEXA SCAN  Completed   HPV VACCINES  Aged Out   DTaP/Tdap/Td  Discontinued   MAMMOGRAM  Discontinued   COVID-19 Vaccine  Discontinued    Health Maintenance  Health Maintenance Due  Topic Date Due   Zoster Vaccines- Shingrix (1 of 2) 02/28/1983   INFLUENZA VACCINE  01/03/2023   Medicare Annual Wellness (AWV)  04/17/2023    Colorectal cancer screening: No longer required.   Mammogram status: No longer required due to age.  Bone Density status: Completed 05/2016. Results reflect: Bone density results: OSTEOPENIA. Repeat every 2 years. Patient does not feel this is necessary now  Lung Cancer Screening: (Low Dose CT Chest recommended if Age 51-80  years, 20 pack-year currently smoking OR have quit w/in 15years.) does not qualify.     Additional Screening:  Hepatitis C Screening: does not qualify; Completed NA age  Vision Screening: Recommended annual ophthalmology exams for early detection of glaucoma and other disorders of the eye. Is the patient up to date with their annual eye exam?  Yes  Who is the provider or what is the name of the office in which the patient attends annual eye exams? Dr. Alvester Morin If pt is not established with a provider, would they like to be referred to a provider to establish care? No .   Dental Screening: Recommended annual dental exams for proper oral hygiene    Community Resource Referral / Chronic Care Management: CRR required this visit?  No   CCM required this visit?  No     Plan:     I have personally reviewed and noted the following in the patient's chart:   Medical and social history Use of alcohol, tobacco or illicit drugs  Current medications and supplements including opioid prescriptions. Patient is not currently taking opioid prescriptions. Functional ability and status Nutritional status Physical activity Advanced directives  List of other physicians Hospitalizations, surgeries, and ER visits in previous 12 months Vitals Screenings to include cognitive, depression, and falls Referrals and appointments  In addition, I have reviewed and discussed with patient certain preventive protocols, quality metrics, and best practice recommendations. A written personalized care plan for preventive services as well as general preventive health recommendations were provided to patient.     Sydell Axon, LPN   14/78/2956   After Visit Summary: (MyChart) Due to this being a telephonic visit, the after visit summary with patients personalized plan was offered to patient via MyChart   Nurse Notes: None

## 2023-04-23 ENCOUNTER — Ambulatory Visit (INDEPENDENT_AMBULATORY_CARE_PROVIDER_SITE_OTHER): Payer: Medicare HMO | Admitting: Nurse Practitioner

## 2023-04-23 ENCOUNTER — Encounter: Payer: Self-pay | Admitting: Nurse Practitioner

## 2023-04-23 VITALS — BP 120/60 | HR 54 | Temp 98.0°F | Ht 64.0 in | Wt 135.8 lb

## 2023-04-23 DIAGNOSIS — M722 Plantar fascial fibromatosis: Secondary | ICD-10-CM | POA: Diagnosis not present

## 2023-04-23 NOTE — Assessment & Plan Note (Signed)
She has experienced left heel pain since the end of October, which is not constant and varies in nature. The pain intensifies with prolonged standing, yet there is no tenderness upon palpation. Shoe inserts have been somewhat beneficial. We will continue the use of shoe inserts for support and start home exercises and stretches specifically for plantar fasciitis. She should use a frozen water bottle or tennis ball to massage the foot and consider using a night splint. Voltaren cream can be applied as needed for pain. If there is no improvement, we will consider a referral to podiatry for further management.

## 2023-04-23 NOTE — Progress Notes (Signed)
Bethanie Dicker, NP-C Phone: [phone]  Laurie [name] is a 89 y.o. female who presents today for heel pain.   Discussed the use of AI scribe software for clinical note transcription with the patient, who gave verbal consent to proceed.  History of Present Illness   The patient, with a history of left knee replacement surgery, presents with left heel pain that began in late October. Initially, she believed it to be a bruise and expected it to improve over time. However, the pain persisted despite the application of various creams. The pain is localized to the heel and does not radiate up the foot. The patient denies any pain upon foot extension or when pressure is applied to the area. She is frequently on her feet and has been using shoe inserts, which seem to alleviate some of the pressure. The patient has not tried any home exercises or therapies such as rolling the foot on a frozen water bottle or tennis ball. She has been using Voltaren cream, an anti-inflammatory, which she believes may be beneficial. The patient also mentioned a change in her gait following knee surgery, which could potentially be contributing to the heel pain.      Social History   Tobacco Use  Smoking Status Never  Smokeless Tobacco Never    Current Outpatient Medications on File Prior to Visit  Medication Sig Dispense Refill   Calcium Carbonate-Vit D-Min (CALCIUM 1200 PO) Take 1,200 mg by mouth 2 (two) times daily.     chlorpheniramine (CHLOR-TRIMETON) 4 MG tablet Take 4 mg by mouth 2 (two) times daily as needed.     Cholecalciferol (D3-1000 PO) Take 1 tablet by mouth daily.     diltiazem (DILT-XR) 180 MG 24 hr capsule TAKE 1 CAPSULE BY MOUTH EVERY DAY 90 capsule 1   estradiol (ESTRACE) 0.1 MG/GM vaginal cream Apply a small amount to urethra with fingertip nightly for 2 weeks,  then twice weekly thereafter 42.5 g 12   fish oil-omega-3 fatty acids 1000 MG capsule Take 1 g by mouth daily.      lisinopril (ZESTRIL)  40 MG tablet TAKE 1 TABLET BY MOUTH TWICE A DAY 180 tablet 1   Multiple Vitamins-Minerals (PRESERVISION AREDS 2 PO) Take by mouth. Take one by mouth daily     Rivaroxaban (XARELTO) 15 MG TABS tablet Take 1 tablet (15 mg total) by mouth daily with supper. 90 tablet 1   rosuvastatin (CRESTOR) 20 MG tablet TAKE 1 TABLET (20 MG TOTAL) BY MOUTH 2 TIMES A WEEK 26 tablet 6   trospium (SANCTURA) 20 MG tablet TAKE 1 TABLET BY MOUTH TWICE A DAY 180 tablet 1   No current facility-administered medications on file prior to visit.    ROS see history of present illness  Objective  Physical Exam Vitals:   04/23/23 0836  BP: 120/60  Pulse: (!) 54  Temp: 98 F (36.7 C)  SpO2: 99%    BP Readings from Last 3 Encounters:  04/23/23 120/60  11/26/22 (!) 142/74  06/14/22 138/62   Wt Readings from Last 3 Encounters:  04/23/23 135 lb 12.8 oz (61.6 kg)  04/22/23 130 lb (59 kg)  11/26/22 130 lb 12.8 oz (59.3 kg)    Physical Exam Constitutional:      General: She is not in acute distress.    Appearance: Normal appearance.  HENT:     Head: Normocephalic.  Cardiovascular:     Rate and Rhythm: Normal rate and regular rhythm.     Heart sounds:  Normal heart sounds.  Pulmonary:     Effort: Pulmonary effort is normal.     Breath sounds: Normal breath sounds.  Musculoskeletal:     Left foot: Normal range of motion. No deformity.  Feet:     Left foot:     Skin integrity: Skin integrity normal. No skin breakdown, erythema or warmth.     Comments: No tenderness on palpation of heel.  Skin:    General: Skin is warm and dry.  Neurological:     General: No focal deficit present.     Mental Status: She is alert.  Psychiatric:        Mood and Affect: Mood normal.        Behavior: Behavior normal.    Assessment/Plan: Please see individual problem list.  Plantar fasciitis, left Assessment & Plan: She has experienced left heel pain since the end of October, which is not constant and varies in  nature. The pain intensifies with prolonged standing, yet there is no tenderness upon palpation. Shoe inserts have been somewhat beneficial. We will continue the use of shoe inserts for support and start home exercises and stretches specifically for plantar fasciitis. She should use a frozen water bottle or tennis ball to massage the foot and consider using a night splint. Voltaren cream can be applied as needed for pain. If there is no improvement, we will consider a referral to podiatry for further management.     Return if symptoms worsen or fail to improve.   Bethanie Dicker, NP-C [facility] [employer]

## 2023-05-13 DIAGNOSIS — M722 Plantar fascial fibromatosis: Secondary | ICD-10-CM | POA: Diagnosis not present

## 2023-05-13 DIAGNOSIS — M25775 Osteophyte, left foot: Secondary | ICD-10-CM | POA: Diagnosis not present

## 2023-06-06 ENCOUNTER — Other Ambulatory Visit: Payer: Self-pay | Admitting: Internal Medicine

## 2023-06-09 ENCOUNTER — Other Ambulatory Visit: Payer: Self-pay | Admitting: Internal Medicine

## 2023-06-13 DIAGNOSIS — M722 Plantar fascial fibromatosis: Secondary | ICD-10-CM | POA: Diagnosis not present

## 2023-06-17 DIAGNOSIS — H353131 Nonexudative age-related macular degeneration, bilateral, early dry stage: Secondary | ICD-10-CM | POA: Diagnosis not present

## 2023-06-17 DIAGNOSIS — H524 Presbyopia: Secondary | ICD-10-CM | POA: Diagnosis not present

## 2023-06-24 ENCOUNTER — Ambulatory Visit: Payer: Medicare HMO | Admitting: Internal Medicine

## 2023-06-25 ENCOUNTER — Telehealth: Payer: Self-pay

## 2023-06-25 NOTE — Telephone Encounter (Signed)
Attempted to call pt. When pt calls back need to ask pt if she is changing her pharmacy to [facility].

## 2023-06-26 ENCOUNTER — Telehealth: Payer: Self-pay | Admitting: Cardiovascular Disease

## 2023-06-26 NOTE — Telephone Encounter (Signed)
Pt c/o medication issue:  1. Name of Medication: Rivaroxaban (XARELTO) 15 MG TABS tablet   2. How are you currently taking this medication (dosage and times per day)? As written  3. Are you having a reaction (difficulty breathing--STAT)? No   4. What is your medication issue? Per pt because her ins changed she can no longer use [facility] but uses [facility]. They told her it would be about 12 days before she receives her medication. She only has one day left. Can she get samples or call in enough to get her through til med comes in mail

## 2023-06-26 NOTE — Telephone Encounter (Signed)
Patient identification verified by 2 forms. Marilynn Rail, RN    Called and spoke to patient  Patient states:   -has a few days of Xarelto Rx left   -awaiting for prescription to be sent from mail delivery   -would like samples of Rx  Informed patient:   -Per chart review over due for OV   -scheduled for OV with Dr. Kirke Corin 1/23 at 9:40am   -at OV could receive temp Rx to local pharmacy  Patient verbalized understanding, no questions at this time

## 2023-06-27 ENCOUNTER — Ambulatory Visit: Payer: Medicare Other | Admitting: Adult Health

## 2023-06-27 ENCOUNTER — Ambulatory Visit: Payer: Medicare Other | Attending: Cardiovascular Disease | Admitting: Cardiovascular Disease

## 2023-06-27 ENCOUNTER — Encounter: Payer: Self-pay | Admitting: Cardiovascular Disease

## 2023-06-27 VITALS — BP 152/60 | HR 75 | Ht 64.0 in | Wt 138.0 lb

## 2023-06-27 DIAGNOSIS — I10 Essential (primary) hypertension: Secondary | ICD-10-CM

## 2023-06-27 DIAGNOSIS — E785 Hyperlipidemia, unspecified: Secondary | ICD-10-CM | POA: Diagnosis not present

## 2023-06-27 DIAGNOSIS — I482 Chronic atrial fibrillation, unspecified: Secondary | ICD-10-CM | POA: Diagnosis not present

## 2023-06-27 MED ORDER — RIVAROXABAN 15 MG PO TABS: 15.0000 mg | ORAL_TABLET | Freq: Every day | ORAL | 0 refills | Status: DC

## 2023-06-27 MED ORDER — DILTIAZEM HCL ER 180 MG PO CP24: ORAL_CAPSULE | ORAL | 3 refills | Status: DC

## 2023-06-27 MED ORDER — RIVAROXABAN 15 MG PO TABS: 15.0000 mg | ORAL_TABLET | Freq: Every day | ORAL | 3 refills | Status: DC

## 2023-06-27 NOTE — Progress Notes (Signed)
Cardiology Office Note   Date:  06/27/2023   ID:  Kwanesha, Puglisi 1931/04/16, MRN [id number]  PCP:  Sherlene Shams, MD  Cardiologist:   Lorine Bears, MD   Chief Complaint  Patient presents with   12 month follow up     "Doing well."       History of Present Illness: Laurie [name] is a 89 y.o. female who presents for a followup visit regarding chronic atrial fibrillation.  She is being treated with diltiazem for rate control. Echocardiogram in 2013 showed normal LV systolic function and mildly dilated left atrium. She is on anticoagulation with Xarelto.   She underwent left knee replacement last year without complications.  She has been doing very well with no chest pain, shortness of breath or palpitations.  She continues to be active in her local church and volunteers at [employer]   Past Medical History:  Diagnosis Date   Arrhythmia    Hypertension     Past Surgical History:  Procedure Laterality Date   KNEE SURGERY Left 03/22/2022   TONSILLECTOMY       Current Outpatient Medications  Medication Sig Dispense Refill   Calcium Carbonate-Vit D-Min (CALCIUM 1200 PO) Take 1,200 mg by mouth 2 (two) times daily.     chlorpheniramine (CHLOR-TRIMETON) 4 MG tablet Take 4 mg by mouth 2 (two) times daily as needed.     Cholecalciferol (D3-1000 PO) Take 1 tablet by mouth daily.     diltiazem (DILT-XR) 180 MG 24 hr capsule TAKE 1 CAPSULE BY MOUTH EVERY DAY 90 capsule 1   estradiol (ESTRACE) 0.1 MG/GM vaginal cream Apply a small amount to urethra with fingertip nightly for 2 weeks,  then twice weekly thereafter 42.5 g 12   fish oil-omega-3 fatty acids 1000 MG capsule Take 1 g by mouth daily.      lisinopril (ZESTRIL) 40 MG tablet TAKE 1 TABLET BY MOUTH TWICE A DAY 180 tablet 1   Multiple Vitamins-Minerals (PRESERVISION AREDS 2 PO) Take by mouth. Take one by mouth daily     Rivaroxaban (XARELTO) 15 MG TABS tablet Take 1 tablet (15 mg total) by mouth daily with  supper. 90 tablet 1   rosuvastatin (CRESTOR) 20 MG tablet TAKE 1 TABLET (20 MG TOTAL) BY MOUTH 2 TIMES A WEEK 26 tablet 6   trospium (SANCTURA) 20 MG tablet TAKE 1 TABLET BY MOUTH TWICE A DAY 180 tablet 1   No current facility-administered medications for this visit.    Allergies:   Hydrochlorothiazide, Losartan, Metoprolol, and Triamterene    Social History:  The patient  reports that she has never smoked. She has never used smokeless tobacco. She reports that she does not drink alcohol and does not use drugs.   Family History:  The patient's family history includes Anemia in her mother; Heart disease (age of onset: 40) in her father; Kidney disease in her mother.    ROS:  Please see the history of present illness.   Otherwise, review of systems are positive for none.   All other systems are reviewed and negative.    PHYSICAL EXAM: VS:  BP (!) 152/60 (BP Location: Left Arm, Patient Position: Sitting, Cuff Size: Normal)   Pulse 75   Ht 5\' 4"  (1.626 m)   Wt 138 lb (62.6 kg)   SpO2 98%   BMI 23.69 kg/m  , BMI Body mass index is 23.69 kg/m. GEN: Well nourished, well developed, in no acute distress  HEENT:  normal  Neck: no JVD, carotid bruits, or masses Cardiac: Irregularly irregular; no murmurs, rubs, or gallops,no edema  Respiratory:  clear to auscultation bilaterally, normal work of breathing GI: soft, nontender, nondistended, + BS MS: no deformity or atrophy  Skin: warm and dry, no rash Neuro:  Strength and sensation are intact Psych: euthymic mood, full affect   EKG:  EKG is ordered today. The ekg ordered today demonstrates : Atrial fibrillation Right bundle branch block Septal infarct (cited on or before 04-Feb-2020) T wave abnormality, consider lateral ischemia When compared with ECG of 27-Jun-2020 18:09, Right bundle branch block is now Present Questionable change in initial forces of Anteroseptal leads    Recent Labs: 11/26/2022: ALT 14; BUN 15; Creatinine, Ser  0.87; Hemoglobin 13.5; Platelets 174.0; Potassium 4.4; Sodium 139; TSH 1.89    Lipid Panel    Component Value Date/Time   CHOL 133 11/26/2022 1051   TRIG 71.0 11/26/2022 1051   HDL 63.60 11/26/2022 1051   CHOLHDL 2 11/26/2022 1051   VLDL 14.2 11/26/2022 1051   LDLCALC 56 11/26/2022 1051   LDLCALC 154 (H) 11/07/2018 1502   LDLDIRECT 54.0 11/26/2022 1051      Wt Readings from Last 3 Encounters:  06/27/23 138 lb (62.6 kg)  04/23/23 135 lb 12.8 oz (61.6 kg)  04/22/23 130 lb (59 kg)         ASSESSMENT AND PLAN:  1.  Chronic atrial fibrillation: Ventricular rate is well controlled on current dose of diltiazem extended release 180 mg once daily. She is tolerating anticoagulation with Xarelto. I refilled both diltiazem and Xarelto.  2. Essential hypertension: Blood pressure is mildly elevated today.  I reviewed her blood pressure readings from most recent visit with her primary care physician and they were mostly in the normal range.  Thus, I elected to make no changes.  Continue lisinopril and diltiazem.  3.  Hyperlipidemia: She takes rosuvastatin 20 mg twice a week.  I reviewed most recent lipid profile done in June which showed an LDL of 54 which is at target.  She does have known history of aortic and coronary calcifications.   Disposition:   FU with me in 1 year  Signed,  Lorine Bears, MD  06/27/2023 10:01 AM    [facility]

## 2023-06-27 NOTE — Patient Instructions (Signed)
 Medication Instructions:  No changes *If you need a refill on your cardiac medications before your next appointment, please call your pharmacy*   Lab Work: None ordered If you have labs (blood work) drawn today and your tests are completely normal, you will receive your results only by: MyChart Message (if you have MyChart) OR A paper copy in the mail If you have any lab test that is abnormal or we need to change your treatment, we will call you to review the results.   Testing/Procedures: None ordered   Follow-Up: At [facility], you and your health needs are our priority.  As part of our continuing mission to provide you with exceptional heart care, we have created designated Provider Care Teams.  These Care Teams include your primary Cardiologist (physician) and Advanced Practice Providers (APPs -  Physician Assistants and Nurse Practitioners) who all work together to provide you with the care you need, when you need it.  We recommend signing up for the patient portal called "MyChart".  Sign up information is provided on this After Visit Summary.  MyChart is used to connect with patients for Virtual Visits (Telemedicine).  Patients are able to view lab/test results, encounter notes, upcoming appointments, etc.  Non-urgent messages can be sent to your provider as well.   To learn more about what you can do with MyChart, go to [email or web address].    Your next appointment:   12 month(s)  Provider:   You may see Lorine Bears, MD or one of the following Advanced Practice Providers on your designated Care Team:   Nicolasa Ducking, NP Eula Listen, PA-C Cadence Fransico Michael, PA-C Charlsie Quest, NP Carlos Levering, NP

## 2023-07-02 ENCOUNTER — Other Ambulatory Visit: Payer: Self-pay

## 2023-07-02 MED ORDER — TROSPIUM CHLORIDE 20 MG PO TABS: 20.0000 mg | ORAL_TABLET | Freq: Two times a day (BID) | ORAL | 1 refills | Status: DC

## 2023-07-02 MED ORDER — ROSUVASTATIN CALCIUM 20 MG PO TABS: ORAL_TABLET | ORAL | 1 refills | Status: DC

## 2023-07-02 MED ORDER — LISINOPRIL 40 MG PO TABS: 40.0000 mg | ORAL_TABLET | Freq: Two times a day (BID) | ORAL | 1 refills | Status: DC

## 2023-07-04 ENCOUNTER — Ambulatory Visit: Payer: Medicare Other | Admitting: Internal Medicine

## 2023-07-04 ENCOUNTER — Encounter: Payer: Self-pay | Admitting: Internal Medicine

## 2023-07-04 VITALS — BP 138/82 | HR 89 | Ht 64.0 in | Wt 134.6 lb

## 2023-07-04 DIAGNOSIS — E782 Mixed hyperlipidemia: Secondary | ICD-10-CM

## 2023-07-04 DIAGNOSIS — I700 Atherosclerosis of aorta: Secondary | ICD-10-CM

## 2023-07-04 DIAGNOSIS — K429 Umbilical hernia without obstruction or gangrene: Secondary | ICD-10-CM

## 2023-07-04 DIAGNOSIS — I482 Chronic atrial fibrillation, unspecified: Secondary | ICD-10-CM

## 2023-07-04 DIAGNOSIS — M722 Plantar fascial fibromatosis: Secondary | ICD-10-CM

## 2023-07-04 DIAGNOSIS — I10 Essential (primary) hypertension: Secondary | ICD-10-CM | POA: Diagnosis not present

## 2023-07-04 DIAGNOSIS — D6869 Other thrombophilia: Secondary | ICD-10-CM

## 2023-07-04 LAB — LIPID PANEL
Cholesterol: 123 mg/dL (ref 0–200)
HDL: 60.3 mg/dL (ref >39.00)
LDL Cholesterol: 44 mg/dL (ref 0–99)
NonHDL: 63.12
Total CHOL/HDL Ratio: 2
Triglycerides: 97 mg/dL (ref 0.0–149.0)
VLDL: 19.4 mg/dL (ref 0.0–40.0)

## 2023-07-04 LAB — COMPREHENSIVE METABOLIC PANEL
ALT: 16 U/L (ref 0–35)
AST: 19 U/L (ref 0–37)
Albumin: 4.2 g/dL (ref 3.5–5.2)
Alkaline Phosphatase: 62 U/L (ref 39–117)
BUN: 14 mg/dL (ref 6–23)
CO2: 31 meq/L (ref 19–32)
Calcium: 10.3 mg/dL (ref 8.4–10.5)
Chloride: 102 meq/L (ref 96–112)
Creatinine, Ser: 0.73 mg/dL (ref 0.40–1.20)
GFR: 72.44 mL/min (ref >60.00)
Glucose, Bld: 100 mg/dL — ABNORMAL HIGH (ref 70–99)
Potassium: 4.5 meq/L (ref 3.5–5.1)
Sodium: 140 meq/L (ref 135–145)
Total Bilirubin: 0.9 mg/dL (ref 0.2–1.2)
Total Protein: 6.5 g/dL (ref 6.0–8.3)

## 2023-07-04 LAB — LDL CHOLESTEROL, DIRECT: Direct LDL: 51 mg/dL

## 2023-07-04 MED ORDER — ESTRADIOL 0.1 MG/GM VA CREA: TOPICAL_CREAM | VAGINAL | 12 refills | Status: AC

## 2023-07-04 NOTE — Patient Instructions (Addendum)
1) I WANT YOU to see Dr Hazle Quant at [facility] about your umbilical hernia.  He is an excellent general surgeon and you might need to have surgery in the future if the hernia becomes "incarcerated"  2) You might want to try using Relaxium for insomnia  (available  on [place],  seen on TV commercials) . It contains:    Melatonin 5 mg  Chamomile 25 mg Passionflower extract 75 mg GABA 100 mg Ashwaganda extract 125 mg Magnesium citrate, glycinate, oxide (100 mg)  L tryptophan 500 mg Valerest (proprietary  ingredient ; probably valeria root extract)    30 Regarding your constipation: There are  3 other categories of laxatives.  They can be combined,  But some should not be used daily .  Bulk  forming laxatives   are Ok to use daily:    Citrucel, benefiber, metamucil, Fibercon, and can be ADDED to the colace (or substituted). They are mixed in water and are tasteless    Cathartic laxatives ( miralax MOM,  Mag citrate, Lactulose ) : not more than 2 /week   Avoid Sennakot ,  not more than  once a week

## 2023-07-04 NOTE — Progress Notes (Signed)
Subjective:  Patient ID: Laurie [name], female    DOB: 26-Jul-1930  Age: 89 y.o. MRN: [id number]  CC: The primary encounter diagnosis was Primary hypertension. Diagnoses of Mixed hyperlipidemia, Umbilical hernia without obstruction and without gangrene, Acquired thrombophilia (HCC), Aortic atherosclerosis (HCC), Chronic atrial fibrillation (HCC), and Plantar fasciitis, left were also pertinent to this visit.   HPI Laurie [name] presents for  Chief Complaint  Patient presents with   Medical Management of Chronic Issues    6 month follow up     1) Chronic atrial fib:  tolerating diltiazem, and Xarelto.  Reviewed cardiology visit from jan 25.  No changes to regimen of cardiac meds and statin    2) h/o knee replacement.    Pain resolved,   3)  left heel pain :  developed  severe left heel pain in november that became severe  before christmas. Improved transiently after a steroid injection by [facility] for  with heel spurs , but pain returnedand was sent to [facility], podiatry  but  was intolerant of the pain medication given .  Pain resolved after praying on her knees for healing .  Balance has improved as well. Wearing heel pads inside shoes   4) constipation;  using colace since last visit , using 2 capsules sometimes 3 ,  moves bowels every 3 days   5() umbilical hernia:  bothered by its appearance.  Denies pain     Outpatient Medications Prior to Visit  Medication Sig Dispense Refill   Calcium Carbonate-Vit D-Min (CALCIUM 1200 PO) Take 1,200 mg by mouth 2 (two) times daily.     chlorpheniramine (CHLOR-TRIMETON) 4 MG tablet Take 4 mg by mouth 2 (two) times daily as needed.     Cholecalciferol (D3-1000 PO) Take 1 tablet by mouth daily.     diltiazem (DILT-XR) 180 MG 24 hr capsule TAKE 1 CAPSULE BY MOUTH EVERY DAY 90 capsule 3   fish oil-omega-3 fatty acids 1000 MG capsule Take 1 g by mouth daily.      lisinopril (ZESTRIL) 40 MG tablet Take 1 tablet (40 mg total) by mouth 2 (two)  times daily. 180 tablet 1   Multiple Vitamins-Minerals (PRESERVISION AREDS 2 PO) Take by mouth. Take one by mouth daily     Rivaroxaban (XARELTO) 15 MG TABS tablet Take 1 tablet (15 mg total) by mouth daily with supper. 30 tablet 0   rosuvastatin (CRESTOR) 20 MG tablet TAKE 1 TABLET (20 MG TOTAL) BY MOUTH 2 TIMES A WEEK 26 tablet 1   trospium (SANCTURA) 20 MG tablet Take 1 tablet (20 mg total) by mouth 2 (two) times daily. 180 tablet 1   estradiol (ESTRACE) 0.1 MG/GM vaginal cream Apply a small amount to urethra with fingertip nightly for 2 weeks,  then twice weekly thereafter 42.5 g 12   No facility-administered medications prior to visit.    Review of Systems;  Patient denies headache, fevers, malaise, unintentional weight loss, skin rash, eye pain, sinus congestion and sinus pain, sore throat, dysphagia,  hemoptysis , cough, dyspnea, wheezing, chest pain, palpitations, orthopnea, edema, abdominal pain, nausea, melena, diarrhea, constipation, flank pain, dysuria, hematuria, urinary  Frequency, nocturia, numbness, tingling, seizures,  Focal weakness, Loss of consciousness,  Tremor, insomnia, depression, anxiety, and suicidal ideation.      Objective:  BP 138/82   Pulse 89   Ht 5\' 4"  (1.626 m)   Wt 134 lb 9.6 oz (61.1 kg)   SpO2 99%   BMI 23.10 kg/m  BP Readings from Last 3 Encounters:  07/04/23 138/82  06/27/23 (!) 152/60  04/23/23 120/60    Wt Readings from Last 3 Encounters:  07/04/23 134 lb 9.6 oz (61.1 kg)  06/27/23 138 lb (62.6 kg)  04/23/23 135 lb 12.8 oz (61.6 kg)    Physical Exam Vitals reviewed.  Constitutional:      General: She is not in acute distress.    Appearance: Normal appearance. She is normal weight. She is not ill-appearing, toxic-appearing or diaphoretic.  HENT:     Head: Normocephalic.  Eyes:     General: No scleral icterus.       Right eye: No discharge.        Left eye: No discharge.     Conjunctiva/sclera: Conjunctivae normal.   Cardiovascular:     Rate and Rhythm: Normal rate and regular rhythm.     Heart sounds: Normal heart sounds.  Pulmonary:     Effort: Pulmonary effort is normal. No respiratory distress.     Breath sounds: Normal breath sounds.  Abdominal:     General: Abdomen is flat. Bowel sounds are normal.     Palpations: Abdomen is soft.       Comments: Umblical hernia,  non reducible   Musculoskeletal:        General: Normal range of motion.  Skin:    General: Skin is warm and dry.  Neurological:     General: No focal deficit present.     Mental Status: She is alert and oriented to person, place, and time. Mental status is at baseline.  Psychiatric:        Mood and Affect: Mood normal.        Behavior: Behavior normal.        Thought Content: Thought content normal.        Judgment: Judgment normal.    Lab Results  Component Value Date   HGBA1C 5.9 11/26/2022   HGBA1C 6.1 11/20/2021   HGBA1C 6.0 05/10/2020    Lab Results  Component Value Date   CREATININE 0.73 07/04/2023   CREATININE 0.87 11/26/2022   CREATININE 0.72 05/14/2022    Lab Results  Component Value Date   WBC 4.9 11/26/2022   HGB 13.5 11/26/2022   HCT 41.2 11/26/2022   PLT 174.0 11/26/2022   GLUCOSE 100 (H) 07/04/2023   CHOL 123 07/04/2023   TRIG 97.0 07/04/2023   HDL 60.30 07/04/2023   LDLDIRECT 51.0 07/04/2023   LDLCALC 44 07/04/2023   ALT 16 07/04/2023   AST 19 07/04/2023   NA 140 07/04/2023   K 4.5 07/04/2023   CL 102 07/04/2023   CREATININE 0.73 07/04/2023   BUN 14 07/04/2023   CO2 31 07/04/2023   TSH 1.89 11/26/2022   INR 1.0 11/16/2011   HGBA1C 5.9 11/26/2022   MICROALBUR 2.1 (H) 11/26/2022    No results found.  Assessment & Plan:  .Primary hypertension Assessment & Plan: Previously Well controlled on current regimen of diltiazem and lisinopril.  She prefers to continue ACE Inhibitor over ARB   . Renal function assessment is due , no changes today.  Home readings requested.   Lab Results   Component Value Date   CREATININE 0.73 07/04/2023   Lab Results  Component Value Date   NA 140 07/04/2023   K 4.5 07/04/2023   CL 102 07/04/2023   CO2 31 07/04/2023     Orders: -     Comprehensive metabolic panel  Mixed hyperlipidemia -     Lipid  panel -     LDL cholesterol, direct  Umbilical hernia without obstruction and without gangrene Assessment & Plan: Now non reducible , but remains painless. . Reviewed the option of monitoring versus seeing a surgeon amd recommended evaluation .  Referral to [facility] Trisha Mangle in progress  Orders: -     Ambulatory referral to General Surgery  Acquired thrombophilia [facility]) Assessment & Plan: She is tolerating use of xarelto for embolic stroke risk mitigation due to  atrial fibrillation. Patient has no signs of bleeding and is advised to notify her specialists prior to any procedure that may required suspension    Aortic atherosclerosis (HCC) Assessment & Plan:  treated with twice weekly rosuvstatin .  LFTS are normal.  Lab Results  Component Value Date   ALT 16 07/04/2023   AST 19 07/04/2023   ALKPHOS 62 07/04/2023   BILITOT 0.9 07/04/2023      Chronic atrial fibrillation (HCC) Assessment & Plan: She remains rate controlled on Diltiazem CR and has  tolerating  with Xarelto.  She is asymptomatic with activities    Plantar fasciitis, left Assessment & Plan: She wasreferred to and  treated by podiatry but did not experience relief of pain until she prayed for healing    Other orders -     Estradiol; Apply a small amount to urethra with fingertip nightly for 2 weeks,  then twice weekly thereafter  Dispense: 42.5 g; Refill: 12     I provided 30 minutes of face-to-face time during this encounter reviewing patient's last visit with me, patient's  most recent visit with cardiology,  podiatry,   recent surgical and non surgical procedures, previous  labs and imaging studies, counseling on currently addressed issues,  and post  visit ordering to diagnostics and therapeutics .   Follow-up: No follow-ups on file.   Sherlene Shams, MD

## 2023-07-06 NOTE — Assessment & Plan Note (Addendum)
She wasreferred to and  treated by podiatry but did not experience relief of pain until she prayed for healing

## 2023-07-06 NOTE — Assessment & Plan Note (Signed)
treated with twice weekly rosuvstatin .  LFTS are normal.  Lab Results  Component Value Date   ALT 16 07/04/2023   AST 19 07/04/2023   ALKPHOS 62 07/04/2023   BILITOT 0.9 07/04/2023

## 2023-07-06 NOTE — Assessment & Plan Note (Signed)
She remains rate controlled on Diltiazem CR and has  tolerating  with Xarelto.  She is asymptomatic with activities

## 2023-07-06 NOTE — Assessment & Plan Note (Signed)
Previously Well controlled on current regimen of diltiazem and lisinopril.  She prefers to continue ACE Inhibitor over ARB   . Renal function assessment is due , no changes today.  Home readings requested.   Lab Results  Component Value Date   CREATININE 0.73 07/04/2023   Lab Results  Component Value Date   NA 140 07/04/2023   K 4.5 07/04/2023   CL 102 07/04/2023   CO2 31 07/04/2023

## 2023-07-06 NOTE — Assessment & Plan Note (Signed)
She is tolerating use of xarelto for embolic stroke risk mitigation due to  atrial fibrillation. Patient has no signs of bleeding and is advised to notify her specialists prior to any procedure that may required suspension

## 2023-07-06 NOTE — Assessment & Plan Note (Signed)
Now non reducible , but remains painless. . Reviewed the option of monitoring versus seeing a surgeon amd recommended evaluation .  Referral to [facility] Laurie [name] in progress

## 2023-07-08 ENCOUNTER — Other Ambulatory Visit: Payer: Self-pay | Admitting: *Deleted

## 2023-07-08 ENCOUNTER — Encounter: Payer: Self-pay | Admitting: *Deleted

## 2023-07-08 DIAGNOSIS — I10 Essential (primary) hypertension: Secondary | ICD-10-CM

## 2023-07-08 DIAGNOSIS — Z7901 Long term (current) use of anticoagulants: Secondary | ICD-10-CM

## 2023-07-08 DIAGNOSIS — R7303 Prediabetes: Secondary | ICD-10-CM

## 2023-07-08 DIAGNOSIS — E782 Mixed hyperlipidemia: Secondary | ICD-10-CM

## 2023-07-11 DIAGNOSIS — K429 Umbilical hernia without obstruction or gangrene: Secondary | ICD-10-CM | POA: Diagnosis not present

## 2023-07-17 ENCOUNTER — Telehealth: Payer: Self-pay | Admitting: Cardiovascular Disease

## 2023-07-17 NOTE — Telephone Encounter (Signed)
Pharmacy please advise on holding Xarelto prior to umbilical hernia repair scheduled for 07/25/2023. Thank you.

## 2023-07-17 NOTE — Telephone Encounter (Signed)
Patient with diagnosis of afib on Xarelto  for anticoagulation.    Procedure: Umbilical hernia  Date of procedure: 07/29/23   CHA2DS2-VASc Score = 5   This indicates a 7.2% annual risk of stroke. The patient's score is based upon: CHF History: 0 HTN History: 1 Diabetes History: 0 Stroke History: 0 Vascular Disease History: 1 Age Score: 2 Gender Score: 1    CrCl 49 mL/min  Platelet count 174 K   Per office protocol, patient can hold Xarelto  for 3 days prior to procedure.     **This guidance is not considered finalized until pre-operative APP has relayed final recommendations.**

## 2023-07-17 NOTE — Telephone Encounter (Signed)
   Pre-operative Risk Assessment    Patient Name: Laurie [name]  DOB: 1930/11/14 MRN: [id number]   Date of last office visit: 06/27/23 Date of next office visit: n/a   Request for Surgical Clearance    Procedure:   Umbilical hernia  Date of Surgery:  Clearance 07/29/23                                Surgeon:  Dr. Greer Ee Group or Practice Name:  [facility] Phone number:  [phone] Fax number:  [phone]   Type of Clearance Requested:   - Pharmacy:  Hold Rivaroxaban (Xarelto) hold x 3 days   Type of Anesthesia:  Not Indicated   Additional requests/questions:    Queen Slough   07/17/2023, 2:44 PM

## 2023-07-17 NOTE — Telephone Encounter (Signed)
   Patient Name: Laurie [name]  DOB: 07/24/1930 MRN: [id number]  Primary Cardiologist: Lorine Bears, MD  Clinical pharmacists have reviewed the patient's past medical history, labs, and current medications as part of preoperative protocol coverage. The following recommendations have been made:   Per office protocol, patient can hold Xarelto  for 3 days prior to procedure.    I will route this recommendation to the requesting party via Epic fax function and remove from pre-op pool.  Please call with questions.  Napoleon Form, Leodis Rains, NP 07/17/2023, 3:11 PM

## 2023-07-23 ENCOUNTER — Other Ambulatory Visit: Payer: Self-pay

## 2023-07-23 ENCOUNTER — Ambulatory Visit: Payer: Self-pay | Admitting: General Surgery

## 2023-07-23 ENCOUNTER — Encounter
Admission: RE | Admit: 2023-07-23 | Discharge: 2023-07-23 | Disposition: A | Discharge status: Home / Self Care | Payer: Medicare Other | Source: Ambulatory Visit | Attending: General Surgery | Admitting: General Surgery

## 2023-07-23 ENCOUNTER — Encounter: Payer: Self-pay | Admitting: Urgent Care

## 2023-07-23 ENCOUNTER — Telehealth: Payer: Self-pay | Admitting: Cardiovascular Disease

## 2023-07-23 VITALS — Ht 64.0 in | Wt 131.5 lb

## 2023-07-23 DIAGNOSIS — I10 Essential (primary) hypertension: Secondary | ICD-10-CM

## 2023-07-23 DIAGNOSIS — Z01812 Encounter for preprocedural laboratory examination: Secondary | ICD-10-CM

## 2023-07-23 DIAGNOSIS — K429 Umbilical hernia without obstruction or gangrene: Secondary | ICD-10-CM

## 2023-07-23 DIAGNOSIS — I482 Chronic atrial fibrillation, unspecified: Secondary | ICD-10-CM

## 2023-07-23 DIAGNOSIS — Z7901 Long term (current) use of anticoagulants: Secondary | ICD-10-CM

## 2023-07-23 HISTORY — DX: Unspecified osteoarthritis, unspecified site: M19.90

## 2023-07-23 HISTORY — DX: Chronic atrial fibrillation, unspecified: I48.20

## 2023-07-23 HISTORY — DX: Plantar fascial fibromatosis: M72.2

## 2023-07-23 HISTORY — DX: Atherosclerosis of aorta: I70.0

## 2023-07-23 HISTORY — DX: Overactive bladder: N32.81

## 2023-07-23 HISTORY — DX: Essential (primary) hypertension: I10

## 2023-07-23 HISTORY — DX: Hyperlipidemia, unspecified: E78.5

## 2023-07-23 HISTORY — DX: Other thrombophilia: D68.69

## 2023-07-23 HISTORY — DX: Umbilical hernia without obstruction or gangrene: K42.9

## 2023-07-23 HISTORY — DX: Cardiac arrhythmia, unspecified: I49.9

## 2023-07-23 NOTE — Telephone Encounter (Signed)
I s/w the pt and she has been made aware of hold time Xarelto x 3 days prior and resume once Dr. Trisha Mangle feels it is safe.   Pt verbalized understanding to plan of care. I assured the pt that our office faxed clearance and recommendations to Dr. Trisha Mangle office on 07/17/23 by Robin Searing, NP.   I assured the pt that I will re-fax notes today as well.

## 2023-07-23 NOTE — Telephone Encounter (Signed)
Patient called to follow-up on holding her Xarelto medication prior to surgery.

## 2023-07-23 NOTE — Patient Instructions (Addendum)
Your procedure is scheduled on: Monday 07/29/23  Report to the Registration Desk on the 1st floor of the [facility]. To find out your arrival time, please call [phone] between 1PM - 3PM on: Friday 07/26/23  If your arrival time is 6:00 am, do not arrive before that time as the [facility] entrance doors do not open until 6:00 am.  REMEMBER: Instructions that are not followed completely may result in serious medical risk, up to and including death; or upon the discretion of your surgeon and anesthesiologist your surgery may need to be rescheduled.  Do not eat food or drink fluids after midnight the night before surgery.  No gum chewing or hard candies.   One week prior to surgery: Stop Anti-inflammatories (NSAIDS) such as Advil, Aleve, Ibuprofen, Motrin, Naproxen, Naprosyn and Aspirin based products such as Excedrin, Goody's Powder, BC Powder. Stop ANY OVER THE COUNTER supplements until after surgery. fish oil-omega-3 fatty acids 1000 MG  cholecalciferol (VITAMIN D3)   Stop Rivaroxaban (XARELTO) 15 MG 3 days prior to surgery (take last dose 07/25/23)  You may however, continue to take Tylenol if needed for pain up until the day of surgery.   Continue taking all of your other prescription medications up until the day of surgery.  ON THE DAY OF SURGERY ONLY TAKE THESE MEDICATIONS WITH SIPS OF WATER:  None    No Alcohol for 24 hours before or after surgery.  No Smoking including e-cigarettes for 24 hours before surgery.  No chewable tobacco products for at least 6 hours before surgery.  No nicotine patches on the day of surgery.  Do not use any "recreational" drugs for at least a week (preferably 2 weeks) before your surgery.  Please be advised that the combination of cocaine and anesthesia may have negative outcomes, up to and including death. If you test positive for cocaine, your surgery will be cancelled.  On the morning of surgery brush your teeth with toothpaste and  water, you may rinse your mouth with mouthwash if you wish. Do not swallow any toothpaste or mouthwash.  Use CHG Soap or wipes as directed on instruction sheet.  Do not wear jewelry, make-up, hairpins, clips or nail polish.  For welded (permanent) jewelry: bracelets, anklets, waist bands, etc.  Please have this removed prior to surgery.  If it is not removed, there is a chance that hospital personnel will need to cut it off on the day of surgery.  Do not wear lotions, powders, or perfumes.   Do not shave body hair from the neck down 48 hours before surgery.  Contact lenses, hearing aids and dentures may not be worn into surgery.  Do not bring valuables to the hospital. [facility] is not responsible for any missing/lost belongings or valuables.   Notify your doctor if there is any change in your medical condition (cold, fever, infection).  Wear comfortable clothing (specific to your surgery type) to the hospital.  After surgery, you can help prevent lung complications by doing breathing exercises.  Take deep breaths and cough every 1-2 hours. Your doctor may order a device called an Incentive Spirometer to help you take deep breaths. When coughing or sneezing, hold a pillow firmly against your incision with both hands. This is called "splinting." Doing this helps protect your incision. It also decreases belly discomfort.  If you are being admitted to the hospital overnight, leave your suitcase in the car. After surgery it may be brought to your room.  In case  of increased patient census, it may be necessary for you, the patient, to continue your postoperative care in the Same Day Surgery department.  If you are being discharged the day of surgery, you will not be allowed to drive home. You will need a responsible individual to drive you home and stay with you for 24 hours after surgery.   If you are taking public transportation, you will need to have a responsible individual with  you.  Please call the Pre-admissions Testing Dept. at [phone] if you have any questions about these instructions.  Surgery Visitation Policy:  Patients having surgery or a procedure may have two visitors.  Children under the age of 45 must have an adult with them who is not the patient.  Temporary Visitor Restrictions Due to increasing cases of flu, RSV and COVID-19: Children ages 66 and under will not be able to visit patients in [facility] hospitals under most circumstances.  Inpatient Visitation:    Visiting hours are 7 a.m. to 8 p.m. Up to four visitors are allowed at one time in a patient room. The visitors may rotate out with other people during the day.  One visitor age 38 or older may stay with the patient overnight and must be in the room by 8 p.m.    Preparing for Surgery with CHLORHEXIDINE GLUCONATE (CHG) Soap  Chlorhexidine Gluconate (CHG) Soap  o An antiseptic cleaner that kills germs and bonds with the skin to continue killing germs even after washing  o Used for showering the night before surgery and morning of surgery  Before surgery, you can play an important role by reducing the number of germs on your skin.  CHG (Chlorhexidine gluconate) soap is an antiseptic cleanser which kills germs and bonds with the skin to continue killing germs even after washing.  Please do not use if you have an allergy to CHG or antibacterial soaps. If your skin becomes reddened/irritated stop using the CHG.  1. Shower the NIGHT BEFORE SURGERY and the MORNING OF SURGERY with CHG soap.  2. If you choose to wash your hair, wash your hair first as usual with your normal shampoo.  3. After shampooing, rinse your hair and body thoroughly to remove the shampoo.  4. Use CHG as you would any other liquid soap. You can apply CHG directly to the skin and wash gently with a scrungie or a clean washcloth.  5. Apply the CHG soap to your body only from the neck down. Do not use on open  wounds or open sores. Avoid contact with your eyes, ears, mouth, and genitals (private parts). Wash face and genitals (private parts) with your normal soap.  6. Wash thoroughly, paying special attention to the area where your surgery will be performed.  7. Thoroughly rinse your body with warm water.  8. Do not shower/wash with your normal soap after using and rinsing off the CHG soap.  9. Pat yourself dry with a clean towel.  10. Wear clean pajamas to bed the night before surgery.  12. Place clean sheets on your bed the night of your first shower and do not sleep with pets.  13. Shower again with the CHG soap on the day of surgery prior to arriving at the hospital.  14. Do not apply any deodorants/lotions/powders.  15. Please wear clean clothes to the hospital.

## 2023-07-24 NOTE — Telephone Encounter (Signed)
   Name: Laurie [name]  DOB: 05/17/1931  MRN: [id number]   Primary Cardiologist: Lorine Bears, MD  Chart reviewed as part of pre-operative protocol coverage. [name] was last seen on 06/27/2023 by Dr. Kirke Corin.  She was doing well at that time with no complaints.   Spoke with patient via telephone today. She denies any changes to her health. She is active with her church and volunteers at the food pantry.   Therefore, based on ACC/AHA guidelines, the patient would be an acceptable risk for the planned procedure without further cardiovascular testing.   Per Pharm D, patient may hold Xarelto for 3 days prior to procedure.    I will route this recommendation to the requesting party via Epic fax function and remove from pre-op pool. Please call with questions.  Carlos Levering, NP 07/24/2023, 9:57 AM

## 2023-07-26 ENCOUNTER — Other Ambulatory Visit: Payer: Self-pay | Admitting: Cardiovascular Disease

## 2023-07-26 ENCOUNTER — Encounter: Payer: Self-pay | Admitting: General Surgery

## 2023-07-26 ENCOUNTER — Other Ambulatory Visit: Payer: Medicare Other

## 2023-07-26 DIAGNOSIS — I482 Chronic atrial fibrillation, unspecified: Secondary | ICD-10-CM

## 2023-07-26 NOTE — Progress Notes (Signed)
 Perioperative / Anesthesia Services  Pre-Admission Testing Clinical Review / Pre-Operative Anesthesia Consult  Date: 07/26/23  Patient Demographics:  Name: Laurie [name] DOB: [birth year] MRN:   [id number]  Planned Surgical Procedure(s):    Case: [id number] Date/Time: 07/29/23 0845   Procedure: XI ROBOT ASSISTED UMBILICAL HERNIA REPAIR (Abdomen)   Anesthesia type: General   Pre-op diagnosis: K42.9  Umbilical hernia w/o obstruction or gangrene   Location: [facility] OR ROOM 04 / [facility] FOR ANEST[facility]   Surgeons: Carolan Shiver, MD      NOTE: Available PAT nursing documentation and vital signs have been reviewed. Clinical nursing staff has updated patient's PMH/PSHx, current medication list, and drug allergies/intolerances to ensure comprehensive history available to assist in medical decision making as it pertains to the aforementioned surgical procedure and anticipated anesthetic course. Extensive review of available clinical information personally performed. Laurie [name] PMH and PSHx updated with any diagnoses/procedures that  may have been inadvertently omitted during his intake with the pre-admission testing department's nursing staff.  Clinical Discussion:  Laurie [name] is a 89 y.o. female who is submitted for pre-surgical anesthesia review and clearance prior to her undergoing the above procedure. Patient has never been a smoker in the past. Pertinent PMH includes: atrial fibrillation, BILATERAL carotid artery disease, aortic atherosclerosis, RBBB, HTN, HLD, acquired thrombophilia, umbilical hernia, OA.  Patient is followed by cardiology Kirke Corin, MD). She was last seen in the cardiology clinic on 06/27/2023; notes reviewed. At the time of her clinic visit, patient doing well overall from a cardiovascular perspective. Patient denied any chest pain, shortness of breath, PND, orthopnea, palpitations, significant peripheral edema, weakness, fatigue, vertiginous symptoms, or  presyncope/syncope. Patient with a past medical history significant for cardiovascular diagnoses. Documented physical exam was grossly benign, providing no evidence of acute exacerbation and/or decompensation of the patient's known cardiovascular conditions.  Patient with an atrial fibrillation diagnosis; CHA2DS2-VASc Score = 5 (age x 2, sex, HTN, vascular disease). Her rate and rhythm are currently being maintained on oral diltiazem. She is chronically anticoagulated using rivaroxaban; reported to be compliant with therapy with no evidence or reports of GI/GU bleeding. Blood pressure elevated at 152/60 mmHg on currently prescribed ACEi (lisinopril) and CCB (diltiazem) therapies. She is on rosuvastatin + omega-3 fatty acid for her HLD diagnosis and further ASCVD prevention.  Patient is not diabetic.  She does not have an OSAH diagnosis.  Functional capacity limited by patient's age.  Patient is able to complete her ADLs/IADLs without cardiovascular limitation.  Patient felt to be able to achieve at least 4 METS of physical activity without experiencing any significant angina/anginal equivalent symptoms. No changes were made to her medication regimen during her visit with cardiology.  Patient scheduled to follow-up with outpatient cardiology in 1 year  or sooner if needed.  Laurie [name] is scheduled for an elective XI ROBOT ASSISTED UMBILICAL HERNIA REPAIR (Abdomen) on 07/29/2023 with Dr. Carolan Shiver, MD.  Given patient's past medical history significant for cardiovascular diagnoses, presurgical cardiac clearance was sought by the PAT team. Per cardiology, "based ACC/AHA guidelines, the patient's past medical history, and the amount of time since her last clinic visit, this patient would be at an overall ACCEPTABLE risk for the planned procedure without further cardiovascular testing or intervention at this time".   Again, this patient is on daily oral anticoagulation therapy using a DOAC  medication.  She has been instructed on recommendations for holding her rivaroxaban for 3 days prior to her procedure with plans to restart  as soon as postoperative bleeding risk felt to be minimized by his attending surgeon. The patient has been instructed that her last dose of rivaroxaban should be on 07/25/2023.  Patient denies previous perioperative complications with anesthesia in the past. In review her EMR, there are no records available for review pertaining to any anesthetic courses within the [facility] system in the recent past.      07/23/2023    1:45 PM 07/04/2023   12:02 PM 06/27/2023    9:48 AM  Vitals with BMI  Height 5\' 4"  5\' 4"  5\' 4"   Weight 131 lbs 8 oz 134 lbs 10 oz 138 lbs  BMI 22.56 23.09 23.68  Systolic  138 152  Diastolic  82 60  Pulse  89 75   Providers/Specialists:  NOTE: Primary physician provider listed below. Patient may have been seen by APP or partner within same practice.   PROVIDER ROLE / SPECIALTY LAST Beverely Pace, MD  General Surgery (Surgeon) 07/11/2023  Sherlene Shams, MD Primary Care Provider 07/04/2023  Lorine Bears, MD Cardiology 06/27/2023; preop APP call 07/24/2023   Allergies:   Allergies  Allergen Reactions   Hydrochlorothiazide Other (See Comments)    Unknown   Losartan Other (See Comments)    angioedema   Metoprolol Other (See Comments)    Unknown   Triamterene Other (See Comments)    Unknown   Current Home Medications:   No current facility-administered medications for this encounter.    Calcium Carbonate-Vit D-Min (CALCIUM 1200 PO)   chlorpheniramine (CHLOR-TRIMETON) 4 MG tablet   cholecalciferol (VITAMIN D3) 25 MCG (1000 UNIT) tablet   diltiazem (DILT-XR) 180 MG 24 hr capsule   estradiol (ESTRACE) 0.1 MG/GM vaginal cream   fish oil-omega-3 fatty acids 1000 MG capsule   lisinopril (ZESTRIL) 40 MG tablet   Multiple Vitamins-Minerals (PRESERVISION AREDS 2 PO)   Polyethyl Glycol-Propyl Glycol (SYSTANE)  0.4-0.3 % SOLN   rosuvastatin (CRESTOR) 20 MG tablet   trospium (SANCTURA) 20 MG tablet   acetaminophen (TYLENOL) 500 MG tablet   Rivaroxaban (XARELTO) 15 MG TABS tablet   History:   Past Medical History:  Diagnosis Date   Acquired thrombophilia (HCC)    Aortic atherosclerosis (HCC)    Arthritis    Bilateral carotid artery disease (HCC)    a.) carotid doppler 10/22/2008: 30-40% LICA; b.) carotid doppler 01/01/2012: 1-39% BICA   Chronic atrial fibrillation (HCC)    a.) CHA2DS2VASc = 5 (age x2, sex, HTN, vascular disease) as of 07/26/2023; b.) rate/rhythm maintained on oral diltiazem; chronically anticoagulated with rivaroxaban   Essential hypertension    Hyperlipidemia, unspecified hyperlipidemia type    Hypertension    On rivaroxaban therapy    Overactive bladder    Plantar fasciitis    Umbilical hernia without obstruction and without gangrene    Past Surgical History:  Procedure Laterality Date   JOINT REPLACEMENT     KNEE SURGERY Left 03/22/2022   TONSILLECTOMY     Family History  Problem Relation Age of Onset   Kidney disease Mother    Anemia Mother    Heart disease Father 49   Social History   Tobacco Use   Smoking status: Never   Smokeless tobacco: Never  Substance Use Topics   Alcohol use: No   Pertinent Clinical Results:  LABS:  Lab Results  Component Value Date   WBC 4.9 11/26/2022   HGB 13.5 11/26/2022   HCT 41.2 11/26/2022   MCV 93.4 11/26/2022   PLT 174.0 11/26/2022  Lab Results  Component Value Date   NA 140 07/04/2023   CL 102 07/04/2023   K 4.5 07/04/2023   CO2 31 07/04/2023   BUN 14 07/04/2023   CREATININE 0.73 07/04/2023   GFR 72.44 07/04/2023   CALCIUM 10.3 07/04/2023   ALBUMIN 4.2 07/04/2023   GLUCOSE 100 (H) 07/04/2023    ECG: Date: 06/27/2023  Time ECG obtained: 0954 AM Rate: 75 bpm Rhythm:  atrial fibrillation; RBBB Axis (leads I and aVF): normal Intervals: QRS 130 ms. QTc 469 ms. ST segment and T wave changes: Lateral  T wave abnormality Evidence of a possible, age undetermined, prior infarct:  Yes; septal Comparison: Similar to previous tracing obtained on 03/01/2022. RBBB now present.    IMAGING / PROCEDURES: CT ANGIO CHEST PE W AND/OR WO CONTRAST performed on 06/28/2020 Negative for acute pulmonary embolus. Widespread bilateral pulmonary opacity typical for COVID-19 pneumonia.  Superimposed small layering pleural effusions. Calcified coronary artery  Aortic atherosclerosis  TRANSTHORACIC ECHOCARDIOGRAM performed on 11/28/2011 Normal left ventricular systolic function with an EF of 55% LV diastolic doppler parameters indeterminent Normal right ventricular systolic function Left atrium mildly dilated Mild AR and MR PASP = 36 mmHg Normal transvalvular gradients; no valvular stenosis No pericardial effusion  Impression and Plan:  Laurie [name] has been referred for pre-anesthesia review and clearance prior to her undergoing the planned anesthetic and procedural courses. Available labs, pertinent testing, and imaging results were personally reviewed by me in preparation for upcoming operative/procedural course. [facility] medical record has been updated following extensive record review and patient interview with PAT staff.   This patient has been appropriately cleared by cardiology with an overall ACCEPTABLE risk of experiencing significant perioperative cardiovascular complications. Based on clinical review performed today (07/26/23), barring any significant acute changes in the patient's overall condition, it is anticipated that she will be able to proceed with the planned surgical intervention. Any acute changes in clinical condition may necessitate her procedure being postponed and/or cancelled. Patient will meet with anesthesia team (MD and/or CRNA) on the day of her procedure for preoperative evaluation/assessment. Questions regarding anesthetic course will be fielded at that time.    Pre-surgical instructions were reviewed with the patient during his PAT appointment, and questions were fielded to satisfaction by PAT clinical staff. She has been instructed on which medications that she will need to hold prior to surgery, as well as the ones that have been deemed safe/appropriate to take on the day of his procedure. As part of the general education provided by PAT, patient made aware both verbally and in writing, that she would need to abstain from the use of any illegal substances during his perioperative course. She was advised that failure to follow the provided instructions could necessitate case cancellation or result in serious perioperative complications up to and including death. Patient encouraged to contact PAT and/or her surgeon's office to discuss any questions or concerns that may arise prior to surgery; verbalized understanding.   Quentin Mulling, MSN, APRN, FNP-C, [facility]  Perioperative Services Nurse Practitioner Phone: [phone] Fax: [phone] 07/26/23 9:31 AM  NOTE: This note has been prepared using Dragon dictation software. Despite my best ability to proofread, there is always the potential that unintentional transcriptional errors may still occur from this process.

## 2023-07-26 NOTE — Telephone Encounter (Signed)
Prescription refill request for Xarelto received.  Indication:afib Last office visit:1/25 Weight:59.6  kg Age:89 Scr:0.73  1/25 CrCl:48.19  ml/min  Prescription refilled

## 2023-07-29 ENCOUNTER — Encounter
Admission: RE | Disposition: A | Discharge status: Home / Self Care | Payer: Self-pay | Source: Home / Self Care | Attending: General Surgery

## 2023-07-29 ENCOUNTER — Ambulatory Visit: Payer: Medicare Other | Admitting: Urgent Care

## 2023-07-29 ENCOUNTER — Ambulatory Visit
Admission: RE | Admit: 2023-07-29 | Discharge: 2023-07-29 | Disposition: A | Discharge status: Home / Self Care | Payer: Medicare Other | Attending: General Surgery | Admitting: General Surgery

## 2023-07-29 ENCOUNTER — Encounter: Payer: Self-pay | Admitting: General Surgery

## 2023-07-29 ENCOUNTER — Other Ambulatory Visit: Payer: Self-pay

## 2023-07-29 DIAGNOSIS — I700 Atherosclerosis of aorta: Secondary | ICD-10-CM | POA: Insufficient documentation

## 2023-07-29 DIAGNOSIS — E785 Hyperlipidemia, unspecified: Secondary | ICD-10-CM | POA: Diagnosis not present

## 2023-07-29 DIAGNOSIS — K429 Umbilical hernia without obstruction or gangrene: Secondary | ICD-10-CM | POA: Diagnosis not present

## 2023-07-29 DIAGNOSIS — Z7901 Long term (current) use of anticoagulants: Secondary | ICD-10-CM | POA: Diagnosis not present

## 2023-07-29 DIAGNOSIS — I2510 Atherosclerotic heart disease of native coronary artery without angina pectoris: Secondary | ICD-10-CM | POA: Insufficient documentation

## 2023-07-29 DIAGNOSIS — I4510 Unspecified right bundle-branch block: Secondary | ICD-10-CM | POA: Diagnosis not present

## 2023-07-29 DIAGNOSIS — Z8249 Family history of ischemic heart disease and other diseases of the circulatory system: Secondary | ICD-10-CM | POA: Diagnosis not present

## 2023-07-29 DIAGNOSIS — K420 Umbilical hernia with obstruction, without gangrene: Secondary | ICD-10-CM | POA: Diagnosis not present

## 2023-07-29 DIAGNOSIS — M1990 Unspecified osteoarthritis, unspecified site: Secondary | ICD-10-CM | POA: Insufficient documentation

## 2023-07-29 DIAGNOSIS — I739 Peripheral vascular disease, unspecified: Secondary | ICD-10-CM | POA: Insufficient documentation

## 2023-07-29 DIAGNOSIS — I482 Chronic atrial fibrillation, unspecified: Secondary | ICD-10-CM | POA: Diagnosis not present

## 2023-07-29 DIAGNOSIS — I10 Essential (primary) hypertension: Secondary | ICD-10-CM | POA: Diagnosis not present

## 2023-07-29 DIAGNOSIS — Z01812 Encounter for preprocedural laboratory examination: Secondary | ICD-10-CM

## 2023-07-29 HISTORY — DX: Long term (current) use of anticoagulants: Z79.01

## 2023-07-29 HISTORY — DX: Personal history of COVID-19: Z86.16

## 2023-07-29 HISTORY — PX: INSERTION OF MESH: SHX5868

## 2023-07-29 HISTORY — DX: Atherosclerotic heart disease of native coronary artery without angina pectoris: I25.10

## 2023-07-29 HISTORY — DX: Unspecified right bundle-branch block: I45.10

## 2023-07-29 HISTORY — DX: Disorder of arteries and arterioles, unspecified: I77.9

## 2023-07-29 HISTORY — DX: Plantar fascial fibromatosis: M72.2

## 2023-07-29 LAB — CBC
HCT: 36.4 % (ref 36.0–46.0)
Hemoglobin: 11.9 g/dL — ABNORMAL LOW (ref 12.0–15.0)
MCH: 29.7 pg (ref 26.0–34.0)
MCHC: 32.7 g/dL (ref 30.0–36.0)
MCV: 90.8 fL (ref 80.0–100.0)
Platelets: 246 10*3/uL (ref 150–400)
RBC: 4.01 MIL/uL (ref 3.87–5.11)
RDW: 14.4 % (ref 11.5–15.5)
WBC: 5.2 10*3/uL (ref 4.0–10.5)
nRBC: 0 % (ref 0.0–0.2)

## 2023-07-29 SURGERY — REPAIR, HERNIA, UMBILICAL, ROBOT-ASSISTED
Anesthesia: General | Site: Abdomen

## 2023-07-29 MED ORDER — LIDOCAINE HCL (CARDIAC) PF 100 MG/5ML IV SOSY
PREFILLED_SYRINGE | INTRAVENOUS | Status: DC | PRN
  Administered 2023-07-29: 60 mg via INTRAVENOUS

## 2023-07-29 MED ORDER — ROCURONIUM BROMIDE 100 MG/10ML IV SOLN
INTRAVENOUS | Status: DC | PRN
Start: 2023-07-29 — End: 2023-07-29
  Administered 2023-07-29: 40 mg via INTRAVENOUS
  Administered 2023-07-29: 10 mg via INTRAVENOUS

## 2023-07-29 MED ORDER — PHENYLEPHRINE 80 MCG/ML (10ML) SYRINGE FOR IV PUSH (FOR BLOOD PRESSURE SUPPORT)
PREFILLED_SYRINGE | INTRAVENOUS | Status: DC | PRN
  Administered 2023-07-29: 80 ug via INTRAVENOUS

## 2023-07-29 MED ORDER — FENTANYL CITRATE (PF) 100 MCG/2ML IJ SOLN
INTRAMUSCULAR | Status: AC
  Filled 2023-07-29: qty 2

## 2023-07-29 MED ORDER — TRAMADOL HCL 50 MG PO TABS: 50.0000 mg | ORAL_TABLET | Freq: Four times a day (QID) | ORAL | 0 refills | Status: DC | PRN

## 2023-07-29 MED ORDER — CHLORHEXIDINE GLUCONATE 0.12 % MT SOLN
OROMUCOSAL | Status: AC
  Filled 2023-07-29: qty 15

## 2023-07-29 MED ORDER — PROPOFOL 10 MG/ML IV BOLUS
INTRAVENOUS | Status: DC | PRN
  Administered 2023-07-29: 100 mg via INTRAVENOUS

## 2023-07-29 MED ORDER — FENTANYL CITRATE (PF) 100 MCG/2ML IJ SOLN
INTRAMUSCULAR | Status: DC | PRN
  Administered 2023-07-29 (×2): 50 ug via INTRAVENOUS

## 2023-07-29 MED ORDER — SUGAMMADEX SODIUM 200 MG/2ML IV SOLN
INTRAVENOUS | Status: DC | PRN
  Administered 2023-07-29: 50 mg via INTRAVENOUS
  Administered 2023-07-29: 150 mg via INTRAVENOUS

## 2023-07-29 MED ORDER — ORAL CARE MOUTH RINSE: 15.0000 mL | Freq: Once | OROMUCOSAL | Status: AC

## 2023-07-29 MED ORDER — DEXAMETHASONE SODIUM PHOSPHATE 10 MG/ML IJ SOLN
INTRAMUSCULAR | Status: DC | PRN
  Administered 2023-07-29: 10 mg via INTRAVENOUS

## 2023-07-29 MED ORDER — BUPIVACAINE-EPINEPHRINE (PF) 0.25% -1:200000 IJ SOLN
INTRAMUSCULAR | Status: AC
  Filled 2023-07-29: qty 30

## 2023-07-29 MED ORDER — SUGAMMADEX SODIUM 200 MG/2ML IV SOLN
INTRAVENOUS | Status: AC
  Filled 2023-07-29: qty 2

## 2023-07-29 MED ORDER — ACETAMINOPHEN 10 MG/ML IV SOLN
INTRAVENOUS | Status: DC | PRN
Start: 2023-07-29 — End: 2023-07-29
  Administered 2023-07-29: 1000 mg via INTRAVENOUS

## 2023-07-29 MED ORDER — ROCURONIUM BROMIDE 10 MG/ML (PF) SYRINGE
PREFILLED_SYRINGE | INTRAVENOUS | Status: AC
  Filled 2023-07-29: qty 10

## 2023-07-29 MED ORDER — LIDOCAINE HCL (PF) 2 % IJ SOLN
INTRAMUSCULAR | Status: AC
  Filled 2023-07-29: qty 5

## 2023-07-29 MED ORDER — DEXAMETHASONE SODIUM PHOSPHATE 10 MG/ML IJ SOLN
INTRAMUSCULAR | Status: AC
  Filled 2023-07-29: qty 1

## 2023-07-29 MED ORDER — OXYCODONE HCL 5 MG PO TABS
ORAL_TABLET | ORAL | Status: AC
  Filled 2023-07-29: qty 1

## 2023-07-29 MED ORDER — ONDANSETRON HCL 4 MG/2ML IJ SOLN
INTRAMUSCULAR | Status: DC | PRN
  Administered 2023-07-29: 4 mg via INTRAVENOUS

## 2023-07-29 MED ORDER — OXYCODONE HCL 5 MG/5ML PO SOLN: 5.0000 mg | Freq: Once | ORAL | Status: AC | PRN

## 2023-07-29 MED ORDER — CEFAZOLIN SODIUM-DEXTROSE 2-4 GM/100ML-% IV SOLN
INTRAVENOUS | Status: AC
  Filled 2023-07-29: qty 100

## 2023-07-29 MED ORDER — ONDANSETRON HCL 4 MG/2ML IJ SOLN
INTRAMUSCULAR | Status: AC
  Filled 2023-07-29: qty 2

## 2023-07-29 MED ORDER — CEFAZOLIN SODIUM-DEXTROSE 2-4 GM/100ML-% IV SOLN
2.0000 g | INTRAVENOUS | Status: AC
  Administered 2023-07-29: 2 g via INTRAVENOUS

## 2023-07-29 MED ORDER — LACTATED RINGERS IV SOLN: INTRAVENOUS | Status: DC

## 2023-07-29 MED ORDER — BUPIVACAINE-EPINEPHRINE (PF) 0.25% -1:200000 IJ SOLN
INTRAMUSCULAR | Status: DC | PRN
  Administered 2023-07-29: 30 mL

## 2023-07-29 MED ORDER — OXYCODONE HCL 5 MG PO TABS
5.0000 mg | ORAL_TABLET | Freq: Once | ORAL | Status: AC | PRN
  Administered 2023-07-29: 5 mg via ORAL

## 2023-07-29 MED ORDER — ACETAMINOPHEN 10 MG/ML IV SOLN
INTRAVENOUS | Status: AC
  Filled 2023-07-29: qty 100

## 2023-07-29 MED ORDER — PROPOFOL 10 MG/ML IV BOLUS
INTRAVENOUS | Status: AC
  Filled 2023-07-29: qty 20

## 2023-07-29 MED ORDER — FENTANYL CITRATE (PF) 100 MCG/2ML IJ SOLN
25.0000 ug | INTRAMUSCULAR | Status: DC | PRN
  Administered 2023-07-29 (×4): 25 ug via INTRAVENOUS

## 2023-07-29 MED ORDER — CHLORHEXIDINE GLUCONATE 0.12 % MT SOLN
15.0000 mL | Freq: Once | OROMUCOSAL | Status: AC
  Administered 2023-07-29: 15 mL via OROMUCOSAL

## 2023-07-29 SURGICAL SUPPLY — 43 items
BAG PRESSURE INF REUSE 1000 (BAG) IMPLANT
COVER TIP SHEARS 8 DVNC (MISCELLANEOUS) ×1 IMPLANT
COVER WAND RF STERILE (DRAPES) ×1 IMPLANT
DERMABOND ADVANCED .7 DNX12 (GAUZE/BANDAGES/DRESSINGS) ×1 IMPLANT
DRAPE ARM DVNC X/XI (DISPOSABLE) ×3 IMPLANT
DRAPE COLUMN DVNC XI (DISPOSABLE) ×1 IMPLANT
ELECT REM PT RETURN 9FT ADLT (ELECTROSURGICAL) ×1
ELECTRODE REM PT RTRN 9FT ADLT (ELECTROSURGICAL) ×1 IMPLANT
FORCEPS BPLR FENES DVNC XI (FORCEP) ×1 IMPLANT
GLOVE BIO SURGEON STRL SZ 6.5 (GLOVE) ×2 IMPLANT
GLOVE BIOGEL PI IND STRL 6.5 (GLOVE) ×2 IMPLANT
GOWN STRL REUS W/ TWL LRG LVL3 (GOWN DISPOSABLE) ×3 IMPLANT
IRRIGATOR SUCT 8 DISP DVNC XI (IRRIGATION / IRRIGATOR) IMPLANT
IV CATH ANGIO 12GX3 LT BLUE (NEEDLE) IMPLANT
IV NS 1000ML BAXH (IV SOLUTION) IMPLANT
KIT PINK PAD W/HEAD ARE REST (MISCELLANEOUS) ×1
KIT PINK PAD W/HEAD ARM REST (MISCELLANEOUS) ×1 IMPLANT
LABEL OR SOLS (LABEL) ×1 IMPLANT
MANIFOLD NEPTUNE II (INSTRUMENTS) ×1 IMPLANT
MESH VENTRALIGHT ST 4.5IN (Mesh General) IMPLANT
NDL DRIVE SUT CUT DVNC (INSTRUMENTS) ×1 IMPLANT
NDL HYPO 22X1.5 SAFETY MO (MISCELLANEOUS) ×1 IMPLANT
NDL INSUFFLATION 14GA 120MM (NEEDLE) ×1 IMPLANT
NEEDLE DRIVE SUT CUT DVNC (INSTRUMENTS) ×1
NEEDLE HYPO 22X1.5 SAFETY MO (MISCELLANEOUS) ×1
NEEDLE INSUFFLATION 14GA 120MM (NEEDLE) ×1
NS IRRIG 500ML POUR BTL (IV SOLUTION) ×1 IMPLANT
OBTURATOR OPTICAL STND 8 DVNC (TROCAR) ×1
OBTURATOR OPTICALSTD 8 DVNC (TROCAR) ×1 IMPLANT
PACK LAP CHOLECYSTECTOMY (MISCELLANEOUS) ×1 IMPLANT
SCISSORS MNPLR CVD DVNC XI (INSTRUMENTS) ×1 IMPLANT
SEAL UNIV 5-12 XI (MISCELLANEOUS) ×3 IMPLANT
SET TUBE SMOKE EVAC HIGH FLOW (TUBING) ×1 IMPLANT
SOL ELECTROSURG ANTI STICK (MISCELLANEOUS) ×1
SOLUTION ELECTROSURG ANTI STCK (MISCELLANEOUS) ×1 IMPLANT
SUT MNCRL 4-0 27 PS-2 XMFL (SUTURE) ×1
SUT STRATA 2-0 30 CT-2 (SUTURE) ×2 IMPLANT
SUT STRATAFIX PDS 30 CT-1 (SUTURE) ×1 IMPLANT
SUT VICRYL 0 UR6 27IN ABS (SUTURE) ×1 IMPLANT
SUTURE MNCRL 4-0 27XMF (SUTURE) ×1 IMPLANT
TAPE TRANSPORE STRL 2 31045 (GAUZE/BANDAGES/DRESSINGS) ×1 IMPLANT
TRAP FLUID SMOKE EVACUATOR (MISCELLANEOUS) ×1 IMPLANT
WATER STERILE IRR 500ML POUR (IV SOLUTION) ×1 IMPLANT

## 2023-07-29 NOTE — Anesthesia Preprocedure Evaluation (Signed)
 Anesthesia Evaluation  Patient identified by MRN, date of birth, ID band Patient awake    Reviewed: Allergy & Precautions, NPO status , Patient's Chart, lab work & pertinent test results  Airway Mallampati: III  TM Distance: >3 FB Neck ROM: full    Dental  (+) Chipped, Dental Advidsory Given   Pulmonary neg pulmonary ROS   Pulmonary exam normal        Cardiovascular hypertension, + CAD and + Peripheral Vascular Disease  + dysrhythmias Atrial Fibrillation      Neuro/Psych negative neurological ROS  negative psych ROS   GI/Hepatic negative GI ROS, Neg liver ROS,,,  Endo/Other  negative endocrine ROS    Renal/GU      Musculoskeletal   Abdominal   Peds  Hematology negative hematology ROS (+)   Anesthesia Other Findings Patient with a PMH of bilateral carotid artery disease.  Past Medical History: No date: Acquired thrombophilia (HCC) No date: Aortic atherosclerosis (HCC) No date: Arthritis No date: Bilateral carotid artery disease (HCC)     Comment:  a.) carotid doppler 10/22/2008: 30-40% LICA; b.) carotid              doppler 01/01/2012: 1-39% BICA No date: Chronic atrial fibrillation (HCC)     Comment:  a.) CHA2DS2VASc = 5 (age x2, sex, HTN, vascular disease)              as of 07/26/2023; b.) rate/rhythm maintained on oral               diltiazem; chronically anticoagulated with rivaroxaban No date: Coronary artery calcification seen on CT scan No date: Essential hypertension No date: History of 2019 novel coronavirus disease (COVID-19) No date: Hyperlipidemia, unspecified hyperlipidemia type No date: Hypertension No date: On rivaroxaban therapy No date: Overactive bladder No date: Plantar fasciitis 06/28/2020: Pneumonia due to COVID-19 virus No date: RBBB (right bundle branch block) No date: Umbilical hernia without obstruction and without gangrene  Past Surgical History: No date: JOINT  REPLACEMENT 03/22/2022: KNEE SURGERY; Left No date: TONSILLECTOMY  BMI    Body Mass Index: 22.57 kg/m      Reproductive/Obstetrics negative OB ROS                             Anesthesia Physical Anesthesia Plan  ASA: 3  Anesthesia Plan: General ETT   Post-op Pain Management:    Induction: Intravenous  PONV Risk Score and Plan: 3 and Ondansetron and Dexamethasone  Airway Management Planned: Oral ETT  Additional Equipment:   Intra-op Plan:   Post-operative Plan: Extubation in OR  Informed Consent: I have reviewed the patients History and Physical, chart, labs and discussed the procedure including the risks, benefits and alternatives for the proposed anesthesia with the patient or authorized representative who has indicated his/her understanding and acceptance.     Dental Advisory Given  Plan Discussed with: Anesthesiologist, CRNA and Surgeon  Anesthesia Plan Comments: (Patient consented for risks of anesthesia including but not limited to:  - adverse reactions to medications - damage to eyes, teeth, lips or other oral mucosa - nerve damage due to positioning  - sore throat or hoarseness - Damage to heart, brain, nerves, lungs, other parts of body or loss of life  Patient voiced understanding and assent.)       Anesthesia Quick Evaluation

## 2023-07-29 NOTE — Transfer of Care (Signed)
 Immediate Anesthesia Transfer of Care Note  Patient: Laurie [name]  Procedure(s) Performed: XI ROBOT ASSISTED UMBILICAL HERNIA REPAIR (Abdomen) INSERTION OF MESH (Abdomen)  Patient Location: PACU  Anesthesia Type:General  Level of Consciousness: awake  Airway & Oxygen Therapy: Patient Spontanous Breathing and Patient connected to face mask oxygen  Post-op Assessment: Report given to RN and Post -op Vital signs reviewed and stable  Post vital signs: Reviewed and stable  Last Vitals:  Vitals Value Taken Time  BP 154/59 07/29/23 1000  Temp    Pulse 67 07/29/23 1000  Resp 13 07/29/23 1000  SpO2 100 % 07/29/23 1000  Vitals shown include unfiled device data.  Last Pain:  Vitals:   07/29/23 0748  PainSc: 0-No pain         Complications: No notable events documented.

## 2023-07-29 NOTE — Op Note (Signed)
 Preoperative diagnosis: Umbilical Hernia  Postoperative diagnosis: Umbilical Hernia  Procedure: Robotic assisted laparoscopic umbilical hernia repair with mesh  Anesthesia: General  Surgeon: Dr. Hazle Quant  Wound Classification: Clean  Specimen: None  Complications: None  Estimated Blood Loss: 10ml  Indications: Patient is a 89 y.o. female developed an umbilical hernia. This was symptomatic and incarcerated and repair was indicated.   Findings: 3 cm incarcerated hernia 2. Repair achieved with closure of the anterior fascia at midline and 11.4 cm Ventralight mesh 3. Adequate hemostasis         Description of procedure: The patient was brought to the operating room and general anesthesia was induced. A time-out was completed verifying correct patient, procedure, site, positioning, and implant(s) and/or special equipment prior to beginning this procedure. Antibiotics were administered prior to making the incision. SCDs placed. The anterior abdominal wall was prepped and draped in the standard sterile fashion.   Palmer's point chosen for entry.  Veress needle placed and abdomen insufflated to 15 mmHg without any dramatic increase in pressure.  Needle removed and optiview technique used to place 8 mm port at same point.  No injury noted during placement. Two additional ports, 8mm x2 along left lateral aspect placed.  Xi robot then docked into place.  Hernia contents noted and reduced with combination of blunt, sharp dissection with scissors and fenestrated forceps.  Hemostasis achieved throughout this portion.  Once all hernia contents reduced, there was noted to be a 3 cm hernia.    Insufflation dropped to 8 mmHg and transfacial suture with 0 stratafix used to primarily close defect under minimal tension. Bard Ventraligh protected 11.4 cm mesh was placed within the abdominal cavity and secured to the abdominal wall centered over the defect using the 0 stratafix previously used to  primarily close defect.  The mesh was then circumferentially sutured into the anterior abdominal wall using 2-0 Stratafix.  Any bleeding noted during this portion was no longer actively bleeding by end of securing mesh and tightening the suture.    Robot was undocked.  Abdomen then desufflated while camera within abdomen to ensure no signs of new bleed prior to removing camera and rest of ports completely.  All skin incisions closed with runninrg 4-0 Monocryl in a subcuticular fashion.  All wounds then dressed with Dermabond.  Patient was then successfully awakened and transferred to PACU in stable condition.  At the end of the procedure sponge and instrument counts were correct.

## 2023-07-29 NOTE — Anesthesia Procedure Notes (Addendum)
 Procedure Name: Intubation Date/Time: 07/29/2023 8:47 AM  Performed by: Susy Manor, RNPre-anesthesia Checklist: Patient identified, Patient being monitored, Timeout performed, Emergency Drugs available and Suction available Patient Re-evaluated:Patient Re-evaluated prior to induction Oxygen Delivery Method: Circle system utilized Preoxygenation: Pre-oxygenation with 100% oxygen Induction Type: IV induction Ventilation: Mask ventilation without difficulty Laryngoscope Size: Mac and 3 Grade View: Grade I Tube type: Oral Tube size: 7.0 mm Number of attempts: 1 Airway Equipment and Method: Stylet Placement Confirmation: ETT inserted through vocal cords under direct vision, positive ETCO2 and breath sounds checked- equal and bilateral Secured at: 22 cm Tube secured with: Tape Dental Injury: Teeth and Oropharynx as per pre-operative assessment

## 2023-07-29 NOTE — H&P (Signed)
 History of Present Illness Laurie [name] is a 89 year old female who presents with an umbilical hernia. She was referred by Dr. Lottie Mussel for evaluation of her umbilical hernia.  She has been aware of the umbilical hernia for approximately one year, initially identified during a routine examination by her primary care physician. Six months later, it was noted to have possibly enlarged, prompting further evaluation. The hernia does not cause significant discomfort or pain, though it becomes more noticeable after eating. There is no associated pain or need for manual reduction, and she has not experienced incarceration or strangulation, as the hernia remains soft and reducible.  Her past medical history includes a knee replacement surgery a year ago, during which she was managed on Xarelto, a blood thinner. She experienced no complications from the surgery and was instructed to hold the medication for two days prior to the procedure.  She has two sons who live nearby, one in [place] and the other in [place]. She maintains an active lifestyle and is often told she looks younger than her age.  No pain associated with the hernia, even when it enlarges after eating. No episodes of the hernia becoming hard or painful, indicating incarceration or strangulation.   PAST MEDICAL HISTORY:  Atrial fibrillation Hypertension   PAST SURGICAL HISTORY:  Knee replacement surgery   MEDICATIONS:  Outpatient Encounter Medications as of 07/11/2023  Medication Sig Dispense Refill  diltiazem (CARDIZEM CD) 120 MG XR capsule Take 120 mg by mouth once daily  estradioL (ESTRACE) 0.01 % (0.1 mg/gram) vaginal cream Apply a small amount to urethra with fingertip nightly for 2 weeks, then twice weekly thereafter  lisinopriL (ZESTRIL) 40 MG tablet TAKE 1 TABLET BY MOUTH TWICE A DAY  lisinopriL (ZESTRIL) 40 MG tablet Take 1 tablet by mouth 2 (two) times daily  rivaroxaban (XARELTO) 15 mg tablet Take 15 mg by mouth   rosuvastatin (CRESTOR) 20 MG tablet TAKE 1 TABLET (20 MG TOTAL) BY MOUTH 2 TIMES A WEEK  trospium (SANCTURA) 20 mg tablet Take 1 tablet by mouth 2 (two) times daily  trospium (SANCTURA) 20 mg tablet Take 20 mg by mouth 2 (two) times daily  oxybutynin (DITROPAN-XL) 10 MG XL tablet TAKE 1 TABLET BY MOUTH EVERYDAY AT BEDTIME (Patient not taking: Reported on 07/11/2023)  rivaroxaban (XARELTO) 20 mg tablet TAKE 1 TABLET BY MOUTH EVERY DAY (Patient not taking: Reported on 07/11/2023)   No facility-administered encounter medications on file as of 07/11/2023.    ALLERGIES:  Hydrochlorothiazide, Losartan, Metoprolol, and Triamterene  SOCIAL HISTORY:  Social History   Socioeconomic History  Marital status: Married  Tobacco Use  Smoking status: Never  Smokeless tobacco: Never  Substance and Sexual Activity  Alcohol use: No  Drug use: No  Sexual activity: Never   Social Drivers of Corporate investment banker Strain: Low Risk (04/22/2023)  Received from [facility]  Overall Financial Resource Strain (CARDIA)  Difficulty of Paying Living Expenses: Not hard at all  Food Insecurity: No Food Insecurity (04/22/2023)  Received from [facility]  Hunger Vital Sign  Worried About Running Out of Food in the Last Year: Never true  Ran Out of Food in the Last Year: Never true  Transportation Needs: No Transportation Needs (04/22/2023)  Received from [facility]  Lack of Transportation (Medical): No  Lack of Transportation (Non-Medical): No   FAMILY HISTORY:  No family history on file.   GENERAL REVIEW OF SYSTEMS:   General ROS: negative for -  chills, fatigue, fever, weight gain or weight loss Allergy and Immunology ROS: negative for - hives  Hematological and Lymphatic ROS: negative for - bleeding problems or bruising, negative for palpable nodes Endocrine ROS: negative for - heat or cold intolerance, hair changes Respiratory ROS: negative for - cough, shortness of  breath or wheezing Cardiovascular ROS: no chest pain or palpitations GI ROS: negative for nausea, vomiting, abdominal pain, diarrhea, constipation Musculoskeletal ROS: negative for - joint swelling or muscle pain Neurological ROS: negative for - confusion, syncope Dermatological ROS: negative for pruritus and rash  PHYSICAL EXAM:  Vitals:  07/11/23 0828  BP: (!) 173/72  Pulse: 81  .  Ht:162.6 cm (5\' 4" ) Wt:59.4 kg (131 lb) ZOX:WRUE surface area is 1.64 meters squared. Body mass index is 22.49 kg/m.Marland Kitchen  GENERAL: Alert, active, oriented x3  HEENT: Pupils equal reactive to light. Extraocular movements are intact. Sclera clear. Palpebral conjunctiva normal red color.Pharynx clear.  NECK: Supple with no palpable mass and no adenopathy.  LUNGS: Sound clear with no rales rhonchi or wheezes.  HEART: Regular rhythm S1 and S2 without murmur.  ABDOMEN: Soft and depressible, nontender with no palpable mass, no hepatomegaly. Palpable umbilical hernia.   EXTREMITIES: Well-developed well-nourished symmetrical with no dependent edema.  NEUROLOGICAL: Awake alert oriented, facial expression symmetrical, moving all extremities.   Assessment & Plan Umbilical Hernia An umbilical hernia has been present for approximately one year and may have enlarged. There is occasional awareness postprandially, but no pain or significant discomfort. Physical examination shows a reducible hernia without signs of incarceration or strangulation. Risks include pain, discomfort, and a rare risk of incarceration leading to bowel obstruction. Elective surgical repair is the definitive treatment, especially given the use of blood thinners, which increase risk if emergency surgery is needed. Laparoscopic repair is a minimally invasive option with a short recovery period. Both surgical and conservative management carry low but equal risks, and the decision should be based on preferences and lifestyle. Discussion with family,  particularly the son, is encouraged to aid decision-making. A follow-up appointment should be scheduled with the son present to discuss surgical options further. If surgery is chosen, plan for laparoscopic repair with mesh placement and coordinate with the primary care physician and cardiologist to manage blood thinners perioperatively. If opting for conservative management, follow up with the primary care physician in six months.  General Health Maintenance Maintains good health for age and remains active. Continue regular follow-ups with the primary care physician and maintain an active lifestyle and balanced diet.  Umbilical hernia without obstruction and without gangrene [K42.9]   Patient verbalized understanding, all questions were answered, and were agreeable with the plan outlined above.   Carolan Shiver, MD  Electronically signed by Carolan Shiver, MD

## 2023-07-29 NOTE — Anesthesia Postprocedure Evaluation (Signed)
 Anesthesia Post Note  Patient: Laurie [name]  Procedure(s) Performed: XI ROBOT ASSISTED UMBILICAL HERNIA REPAIR (Abdomen) INSERTION OF MESH (Abdomen)  Patient location during evaluation: PACU Anesthesia Type: General Level of consciousness: awake and alert Pain management: pain level controlled Vital Signs Assessment: post-procedure vital signs reviewed and stable Respiratory status: spontaneous breathing, nonlabored ventilation, respiratory function stable and patient connected to nasal cannula oxygen Cardiovascular status: blood pressure returned to baseline and stable Postop Assessment: no apparent nausea or vomiting Anesthetic complications: no  No notable events documented.   Last Vitals:  Vitals:   07/29/23 1030 07/29/23 1032  BP: (!) 168/48   Pulse: 64 74  Resp: 14 12  Temp:    SpO2: 92% 90%    Last Pain:  Vitals:   07/29/23 1032  PainSc: 7                  [address]

## 2023-07-29 NOTE — Discharge Instructions (Signed)

## 2023-07-30 ENCOUNTER — Encounter: Payer: Self-pay | Admitting: General Surgery

## 2023-08-13 DIAGNOSIS — K429 Umbilical hernia without obstruction or gangrene: Secondary | ICD-10-CM | POA: Diagnosis not present

## 2023-08-15 ENCOUNTER — Ambulatory Visit (INDEPENDENT_AMBULATORY_CARE_PROVIDER_SITE_OTHER): Admitting: Family Medicine

## 2023-08-15 ENCOUNTER — Encounter: Payer: Self-pay | Admitting: Family Medicine

## 2023-08-15 ENCOUNTER — Ambulatory Visit: Payer: Self-pay | Admitting: Internal Medicine

## 2023-08-15 VITALS — BP 134/68 | HR 81 | Resp 16 | Ht 64.0 in | Wt 137.0 lb

## 2023-08-15 DIAGNOSIS — N3281 Overactive bladder: Secondary | ICD-10-CM

## 2023-08-15 DIAGNOSIS — R300 Dysuria: Secondary | ICD-10-CM

## 2023-08-15 LAB — POCT URINALYSIS DIPSTICK
Appearance: NORMAL
Bilirubin, UA: NEGATIVE
Glucose, UA: NEGATIVE
Ketones, UA: NEGATIVE
Nitrite, UA: NEGATIVE
Protein, UA: NEGATIVE
Spec Grav, UA: 1.015 (ref 1.010–1.025)
Urobilinogen, UA: 0.2 U/dL
pH, UA: 6 (ref 5.0–8.0)

## 2023-08-15 MED ORDER — PHENAZOPYRIDINE HCL 100 MG PO TABS: 100.0000 mg | ORAL_TABLET | Freq: Three times a day (TID) | ORAL | 0 refills | Status: DC | PRN

## 2023-08-15 MED ORDER — CEPHALEXIN 500 MG PO CAPS: 500.0000 mg | ORAL_CAPSULE | Freq: Two times a day (BID) | ORAL | 0 refills | Status: AC

## 2023-08-15 NOTE — Telephone Encounter (Signed)
 Pt was seen today by a different provider.

## 2023-08-15 NOTE — Progress Notes (Signed)
 Patient ID: EDDIS [name], female    DOB: 27-Apr-1931, 89 y.o.   MRN: [id number]  PCP: Sherlene Shams, MD  Chief Complaint  Patient presents with   Urinary Frequency    Started yesterday   Dysuria    Subjective:   Laurie [name] [name] is a 89 y.o. female, presents to clinic with CC of the following:  Urinary Frequency  This is a new problem. The current episode started yesterday. The quality of the pain is described as burning. There has been no fever. There is No history of pyelonephritis. Associated symptoms include frequency and urgency. Pertinent negatives include no chills, discharge, flank pain, hematuria, hesitancy, nausea, sweats or vomiting. She has tried nothing for the symptoms. There is no history of kidney stones, recurrent UTIs or a urological procedure.  Dysuria  This is a new problem. The current episode started yesterday. The problem occurs every urination. Associated symptoms include frequency and urgency. Pertinent negatives include no chills, discharge, flank pain, hematuria, hesitancy, nausea, sweats or vomiting. There is no history of kidney stones, recurrent UTIs or a urological procedure.    Here for one day of urinary sx, urgency, dysuria, she in office had urgency and tried to go for more than 15 min and was unable to give urine sample.  She reports discomfort, freq, urgency, no hematuria, no abd or flank pain, no N, V, bowel changes confusion balance issues Per chart only a few + urine cultures in the past couple years - e.coli  Hx of OAB and atrophic vaginitis managed by her PCP with trospium and vaginal estradiol  Patient Active Problem List   Diagnosis Date Noted   Plantar fasciitis, left 04/23/2023   S/P total knee arthroplasty, left 06/14/2022   Umbilical hernia 12/12/2020   Grief at loss of child 11/13/2020   Aortic atherosclerosis (HCC) 07/09/2020   History of COVID-19 06/27/2020   Vitamin D deficiency 05/13/2020   Acquired thrombophilia (HCC)  05/12/2020   Subcutaneous mass of right lower extremity 11/13/2019   Educated about COVID-19 virus infection 05/17/2019   Overactive bladder 04/15/2019   Encounter for preventive health examination 03/27/2016   Allergic rhinitis 01/13/2016   Dysuria 06/03/2015   S/P cataract extraction and insertion of intraocular lens 07/07/2014   Hyperlipidemia 05/01/2013   Statin intolerance 02/12/2013   Long term current use of anticoagulant therapy 03/10/2012   Encounter for Medicare annual wellness exam 03/10/2012   Hypertension 11/05/2011   Chronic atrial fibrillation (HCC) 11/05/2011      Current Outpatient Medications:    acetaminophen (TYLENOL) 500 MG tablet, Take 500 mg by mouth every 6 (six) hours as needed., Disp: , Rfl:    Calcium Carbonate-Vit D-Min (CALCIUM 1200 PO), Take 1,200 mg by mouth 2 (two) times daily., Disp: , Rfl:    cholecalciferol (VITAMIN D3) 25 MCG (1000 UNIT) tablet, Take 1,000 Units by mouth daily., Disp: , Rfl:    diltiazem (DILT-XR) 180 MG 24 hr capsule, TAKE 1 CAPSULE BY MOUTH EVERY DAY, Disp: 90 capsule, Rfl: 3   estradiol (ESTRACE) 0.1 MG/GM vaginal cream, Apply a small amount to urethra with fingertip nightly for 2 weeks,  then twice weekly thereafter, Disp: 42.5 g, Rfl: 12   fish oil-omega-3 fatty acids 1000 MG capsule, Take 1 g by mouth daily. , Disp: , Rfl:    lisinopril (ZESTRIL) 40 MG tablet, Take 1 tablet (40 mg total) by mouth 2 (two) times daily., Disp: 180 tablet, Rfl: 1   Multiple Vitamins-Minerals (PRESERVISION AREDS  2 PO), Take 1 capsule by mouth daily., Disp: , Rfl:    Polyethyl Glycol-Propyl Glycol (SYSTANE) 0.4-0.3 % SOLN, Place 1 drop into both eyes daily as needed (Dry eye)., Disp: , Rfl:    Rivaroxaban (XARELTO) 15 MG TABS tablet, TAKE 1 TABLET(15 MG) BY MOUTH DAILY WITH SUPPER, Disp: 30 tablet, Rfl: 5   rosuvastatin (CRESTOR) 20 MG tablet, TAKE 1 TABLET (20 MG TOTAL) BY MOUTH 2 TIMES A WEEK, Disp: 26 tablet, Rfl: 1   traMADol (ULTRAM) 50 MG  tablet, Take 1 tablet (50 mg total) by mouth every 6 (six) hours as needed., Disp: 20 tablet, Rfl: 0   trospium (SANCTURA) 20 MG tablet, Take 1 tablet (20 mg total) by mouth 2 (two) times daily., Disp: 180 tablet, Rfl: 1   chlorpheniramine (CHLOR-TRIMETON) 4 MG tablet, Take 4 mg by mouth 2 (two) times daily as needed for rhinitis. (Patient not taking: Reported on 07/23/2023), Disp: , Rfl:    Allergies  Allergen Reactions   Hydrochlorothiazide Other (See Comments)    Unknown   Losartan Other (See Comments)    angioedema   Metoprolol Other (See Comments)    Unknown   Triamterene Other (See Comments)    Unknown     Social History   Tobacco Use   Smoking status: Never   Smokeless tobacco: Never  Vaping Use   Vaping status: Never Used  Substance Use Topics   Alcohol use: No   Drug use: No      Chart Review Today: I personally reviewed active problem list, medication list, allergies, family history, social history, health maintenance, notes from last encounter, lab results, imaging with the patient/caregiver today.   Review of Systems  Constitutional: Negative.  Negative for chills.  HENT: Negative.    Eyes: Negative.   Respiratory: Negative.    Cardiovascular: Negative.   Gastrointestinal: Negative.  Negative for nausea and vomiting.  Endocrine: Negative.   Genitourinary:  Positive for dysuria, frequency and urgency. Negative for flank pain, hematuria and hesitancy.  Musculoskeletal: Negative.   Skin: Negative.   Allergic/Immunologic: Negative.   Neurological: Negative.   Hematological: Negative.   Psychiatric/Behavioral: Negative.    All other systems reviewed and are negative.      Objective:   Vitals:   08/15/23 1112  BP: 134/68  Pulse: 81  Resp: 16  SpO2: 98%  Weight: 137 lb (62.1 kg)  Height: 5\' 4"  (1.626 m)    Body mass index is 23.52 kg/m.  Physical Exam Vitals and nursing note reviewed.  Constitutional:      General: She is not in acute  distress.    Appearance: Normal appearance. She is well-developed. She is not ill-appearing, toxic-appearing or diaphoretic.  HENT:     Head: Normocephalic and atraumatic.     Nose: Nose normal.  Eyes:     General:        Right eye: No discharge.        Left eye: No discharge.     Conjunctiva/sclera: Conjunctivae normal.  Neck:     Trachea: No tracheal deviation.  Cardiovascular:     Rate and Rhythm: Normal rate and regular rhythm.  Pulmonary:     Effort: Pulmonary effort is normal. No respiratory distress.     Breath sounds: No stridor.  Abdominal:     General: Bowel sounds are normal. There is no distension.     Palpations: Abdomen is soft.     Tenderness: There is no abdominal tenderness. There is no right CVA  tenderness, left CVA tenderness, guarding or rebound.  Skin:    General: Skin is warm and dry.     Findings: No rash.  Neurological:     Mental Status: She is alert.     Motor: No abnormal muscle tone.     Coordination: Coordination normal.  Psychiatric:        Mood and Affect: Mood normal.        Behavior: Behavior normal.      Results for orders placed or performed during the hospital encounter of 07/29/23  CBC   Collection Time: 07/29/23  8:30 AM  Result Value Ref Range   WBC 5.2 4.0 - 10.5 K/uL   RBC 4.01 3.87 - 5.11 MIL/uL   Hemoglobin 11.9 (L) 12.0 - 15.0 g/dL   HCT 16.1 09.6 - 04.5 %   MCV 90.8 80.0 - 100.0 fL   MCH 29.7 26.0 - 34.0 pg   MCHC 32.7 30.0 - 36.0 g/dL   RDW 40.9 81.1 - 91.4 %   Platelets 246 150 - 400 K/uL   nRBC 0.0 0.0 - 0.2 %       Assessment & Plan:   1. Dysuria (Primary) Dip showed blood and LE, will tx for suspected simple UTI with keflex, pt will try to bring urine sample back to send for culture, here in office urine was only enough to do dip Abd exam benign, she is well appearing, VSS, discussed close f/up if any worsening of sx or no improvement of sx in the next few days Results for orders placed or performed in visit on  08/15/23  POCT urinalysis dipstick   Collection Time: 08/15/23 11:41 AM  Result Value Ref Range   Color, UA Yellow    Clarity, UA Clear    Glucose, UA Negative Negative   Bilirubin, UA Negative    Ketones, UA Negative    Spec Grav, UA 1.015 1.010 - 1.025   Blood, UA Large (A)    pH, UA 6.0 5.0 - 8.0   Protein, UA Negative Negative   Urobilinogen, UA 0.2 0.2 or 1.0 E.U./dL   Nitrite, UA Negative    Leukocytes, UA Trace (A) Negative   Appearance Normal    Odor None     - POCT urinalysis dipstick - cephALEXin (KEFLEX) 500 MG capsule; Take 1 capsule (500 mg total) by mouth 2 (two) times daily for 5 days.  Dispense: 10 capsule; Refill: 0 - phenazopyridine (PYRIDIUM) 100 MG tablet; Take 1 tablet (100 mg total) by mouth 3 (three) times daily as needed for pain.  Dispense: 10 tablet; Refill: 0 - Urine Culture  2. Overactive bladder Hx of, on meds    Return for PCP As needed if not improving in 5-7 d.     Danelle Berry, PA-C 08/15/23 11:28 AM

## 2023-08-15 NOTE — Patient Instructions (Signed)
 Please start the antibiotic today and take another dose tonight. I have sent in keflex which is an empiric antibiotic meaning it covers most bacteria that causes bladder infections and it has low levels of antibiotic resistance and for you and your age and health is the best antibiotic with the least amount of side effects. Push ample fluids You can also take AZO over the counter to help with the urinary discomfort.  We usually only recommend this for 2-3 days.  It will cause urine to be a bright orange to red color.

## 2023-08-15 NOTE — Telephone Encounter (Signed)
 Copied from CRM [id number]. Topic: Clinical - Red Word Triage >> Aug 15, 2023  9:17 AM Theodis Sato wrote: Red Word that prompted transfer to Nurse Triage: Burning with urinating as well as frequent urination.  Chief Complaint: Burning while urinating Symptoms: Urinary urge and frequency Frequency: Yesterday Pertinent Negatives: Patient denies fever Disposition: [] ED /[] Urgent Care (no appt availability in office) / [x] Appointment(In office/virtual)/ []  [facility]/ [] Home Care/ [] Refused Recommended Disposition /[] [facility]/ []  Follow-up with PCP Additional Notes: Patient called in to report urinary symptoms that started yesterday. Patient reported burning while urinating, urinary urge and urinary frequency. Patient denied fever, flank pain and blood in urine. This RN advised patient to see a provider within 24 hours, per protocol. No availability with PCP/PCP office. This RN scheduled same day appointment at alternate office. This RN advised patient to call back if symptoms worse. Patient complied.  Reason for Disposition  Urinating more frequently than usual (i.e., frequency)  Answer Assessment - Initial Assessment Questions 1. SYMPTOM: "What's the main symptom you're concerned about?" (e.g., frequency, incontinence)     Urinary frequency, burning while urinary 2. ONSET: "When did the symptoms start?"     Yesterday 3. PAIN: "Is there any pain?" If Yes, ask: "How bad is it?" (Scale: 1-10; mild, moderate, severe)     States "pain" is more so burning 4. CAUSE: "What do you think is causing the symptoms?"     UTI 5. OTHER SYMPTOMS: "Do you have any other symptoms?" (e.g., blood in urine, fever, flank pain, pain with urination)     Denies blood in urine, denies lower back pain, denies fever  Protocols used: Urinary Symptoms-A-AH

## 2023-08-16 DIAGNOSIS — R300 Dysuria: Secondary | ICD-10-CM | POA: Diagnosis not present

## 2023-08-17 LAB — URINE CULTURE
MICRO NUMBER:: 16203582
Result:: NO GROWTH
SPECIMEN QUALITY:: ADEQUATE

## 2023-09-10 DIAGNOSIS — K080 Exfoliation of teeth due to systemic causes: Secondary | ICD-10-CM | POA: Diagnosis not present

## 2023-11-04 ENCOUNTER — Telehealth: Payer: Self-pay | Admitting: Cardiovascular Disease

## 2023-11-04 DIAGNOSIS — I482 Chronic atrial fibrillation, unspecified: Secondary | ICD-10-CM

## 2023-11-04 MED ORDER — RIVAROXABAN 15 MG PO TABS: 15.0000 mg | ORAL_TABLET | Freq: Every day | ORAL | 2 refills | Status: DC

## 2023-11-04 NOTE — Telephone Encounter (Signed)
 Prescription refill request for Xarelto  received.  Indication: Afib  Last office visit: 06/27/23 Alvenia Aus)  Weight: 62.1kg Age: 89 Scr: 0.73 (07/04/23)  CrCl: 50.46ml/min  Per Donivan Furry, PharmD pt should continue Xarelto  15mg  daily at this time. Refill sent to requested pharmacy.

## 2023-11-04 NOTE — Telephone Encounter (Signed)
*  STAT* If patient is at the pharmacy, call can be transferred to refill team.   1. Which medications need to be refilled? (please list name of each medication and dose if known) Rivaroxaban  (XARELTO ) 15 MG TABS tablet   2. Which pharmacy/location (including street and city if local pharmacy) is medication to be sent to?  [id number] - [place], [place] - [address] AT [facility] OF [address]      3. Do they need a 30 day or 90 day supply? 30 day    Pt is out of medication and waiting for home delivery but unsure of when it'll arrive.

## 2023-11-06 NOTE — Telephone Encounter (Signed)
 Pt called in asking to speak with nurse about this request. She insists that she never made this request and that the pharmacy only gave her 3 tablets. Please advise.

## 2023-11-06 NOTE — Telephone Encounter (Signed)
 Called pt who stated she has been out of her xarelto  for a week due to waiting for her mail order. Pt stated she received a temporary supply for 3 pills for her local pharmacy for $57. However she noted she has yet to receive her mail delivery and only have 1 more pill left.   Nurse will touch bases with pt tomorrow for an update.

## 2023-11-07 NOTE — Telephone Encounter (Signed)
 Nurse contacted [facility] who reported that pt's medication was shipped on the 10/30/23 and should arrive by tomorrow 11/08/23 by 8 pm. Pt made aware

## 2023-12-03 ENCOUNTER — Other Ambulatory Visit: Payer: Self-pay | Admitting: Internal Medicine

## 2023-12-13 ENCOUNTER — Other Ambulatory Visit: Payer: Self-pay | Admitting: Internal Medicine

## 2023-12-19 DIAGNOSIS — H353131 Nonexudative age-related macular degeneration, bilateral, early dry stage: Secondary | ICD-10-CM | POA: Diagnosis not present

## 2024-01-06 ENCOUNTER — Other Ambulatory Visit: Payer: Medicare Other

## 2024-01-15 ENCOUNTER — Other Ambulatory Visit

## 2024-01-15 DIAGNOSIS — E782 Mixed hyperlipidemia: Secondary | ICD-10-CM | POA: Diagnosis not present

## 2024-01-15 DIAGNOSIS — I10 Essential (primary) hypertension: Secondary | ICD-10-CM

## 2024-01-15 DIAGNOSIS — R7303 Prediabetes: Secondary | ICD-10-CM

## 2024-01-15 DIAGNOSIS — Z7901 Long term (current) use of anticoagulants: Secondary | ICD-10-CM

## 2024-01-15 LAB — COMPREHENSIVE METABOLIC PANEL WITH GFR
ALT: 14 U/L (ref 0–35)
AST: 16 U/L (ref 0–37)
Albumin: 3.9 g/dL (ref 3.5–5.2)
Alkaline Phosphatase: 63 U/L (ref 39–117)
BUN: 13 mg/dL (ref 6–23)
CO2: 30 meq/L (ref 19–32)
Calcium: 9.3 mg/dL (ref 8.4–10.5)
Chloride: 102 meq/L (ref 96–112)
Creatinine, Ser: 0.71 mg/dL (ref 0.40–1.20)
GFR: 74.62 mL/min (ref >60.00)
Glucose, Bld: 99 mg/dL (ref 70–99)
Potassium: 3.9 meq/L (ref 3.5–5.1)
Sodium: 138 meq/L (ref 135–145)
Total Bilirubin: 0.9 mg/dL (ref 0.2–1.2)
Total Protein: 6.2 g/dL (ref 6.0–8.3)

## 2024-01-15 LAB — LIPID PANEL
Cholesterol: 119 mg/dL (ref 0–200)
HDL: 52.4 mg/dL (ref >39.00)
LDL Cholesterol: 46 mg/dL (ref 0–99)
NonHDL: 66.91
Total CHOL/HDL Ratio: 2
Triglycerides: 106 mg/dL (ref 0.0–149.0)
VLDL: 21.2 mg/dL (ref 0.0–40.0)

## 2024-01-15 LAB — CBC WITH DIFFERENTIAL/PLATELET
Basophils Absolute: 0 K/uL (ref 0.0–0.1)
Basophils Relative: 0.9 % (ref 0.0–3.0)
Eosinophils Absolute: 0.2 K/uL (ref 0.0–0.7)
Eosinophils Relative: 3.7 % (ref 0.0–5.0)
HCT: 37.2 % (ref 36.0–46.0)
Hemoglobin: 12.4 g/dL (ref 12.0–15.0)
Lymphocytes Relative: 12.2 % (ref 12.0–46.0)
Lymphs Abs: 0.6 K/uL — ABNORMAL LOW (ref 0.7–4.0)
MCHC: 33.3 g/dL (ref 30.0–36.0)
MCV: 91.1 fl (ref 78.0–100.0)
Monocytes Absolute: 0.3 K/uL (ref 0.1–1.0)
Monocytes Relative: 7.5 % (ref 3.0–12.0)
Neutro Abs: 3.5 K/uL (ref 1.4–7.7)
Neutrophils Relative %: 75.7 % (ref 43.0–77.0)
Platelets: 167 K/uL (ref 150.0–400.0)
RBC: 4.09 Mil/uL (ref 3.87–5.11)
RDW: 15.3 % (ref 11.5–15.5)
WBC: 4.6 K/uL (ref 4.0–10.5)

## 2024-01-15 LAB — TSH: TSH: 1.77 u[IU]/mL (ref 0.35–5.50)

## 2024-01-15 LAB — LDL CHOLESTEROL, DIRECT: Direct LDL: 50 mg/dL

## 2024-01-15 LAB — HEMOGLOBIN A1C: Hgb A1c MFr Bld: 6.2 % (ref 4.6–6.5)

## 2024-01-17 ENCOUNTER — Ambulatory Visit: Payer: Self-pay | Admitting: Internal Medicine

## 2024-01-17 NOTE — Telephone Encounter (Signed)
 Copied from CRM #[id number]. Topic: Clinical - Lab/Test Results >> Jan 17, 2024  3:36 PM [place] wrote: Reason for CRM: Relayed lab results to patient

## 2024-01-17 NOTE — Progress Notes (Signed)
 Noted

## 2024-02-19 ENCOUNTER — Encounter: Payer: Self-pay | Admitting: Internal Medicine

## 2024-02-19 ENCOUNTER — Telehealth: Payer: Self-pay

## 2024-02-19 ENCOUNTER — Ambulatory Visit (INDEPENDENT_AMBULATORY_CARE_PROVIDER_SITE_OTHER): Admitting: Internal Medicine

## 2024-02-19 VITALS — BP 162/78 | HR 82 | Temp 97.6°F | Ht 64.0 in | Wt 134.2 lb

## 2024-02-19 DIAGNOSIS — R300 Dysuria: Secondary | ICD-10-CM | POA: Diagnosis not present

## 2024-02-19 DIAGNOSIS — R7303 Prediabetes: Secondary | ICD-10-CM

## 2024-02-19 DIAGNOSIS — I10 Essential (primary) hypertension: Secondary | ICD-10-CM | POA: Diagnosis not present

## 2024-02-19 DIAGNOSIS — K429 Umbilical hernia without obstruction or gangrene: Secondary | ICD-10-CM | POA: Diagnosis not present

## 2024-02-19 DIAGNOSIS — R011 Cardiac murmur, unspecified: Secondary | ICD-10-CM | POA: Diagnosis not present

## 2024-02-19 MED ORDER — LISINOPRIL 40 MG PO TABS: 40.0000 mg | ORAL_TABLET | Freq: Every day | ORAL | 1 refills | Status: DC

## 2024-02-19 NOTE — Patient Instructions (Addendum)
 Check your BP at home once a day for the next 2 weeks and send me the readings   I have reduced lisinopril  dose to 40 mg once daily because the higher dose is now not considered safe.  You may required additional medications to contorl your BP so please check at hoe   Our goal 130/80  I HAVE ordered an ECHO of your heart to determine what is causing your murmur..         Consider joining a Silver Sneakers exercise program at one of the local gyms to improve your core strength and balance.  [facility] may also have a Silver Sneakers Program

## 2024-02-19 NOTE — Telephone Encounter (Signed)
 Patient states at check-out that she believes she should schedule a follow-up appointment, but she isn't sure when it should be.  Patient states she would like to know what Dr. Verneita Kettering would like for her to do.  I was unable to reach Harlene Sheldon, CMA, at the time of check-out.

## 2024-02-19 NOTE — Progress Notes (Signed)
 Subjective:  Patient ID: Laurie [name], female    DOB: 04/14/1931  Age: 89 y.o. MRN: [id number]  CC: The primary encounter diagnosis was Dysuria. Diagnoses of Systolic murmur, Primary hypertension, Umbilical hernia without obstruction and without gangrene, and Prediabetes were also pertinent to this visit.   HPI Laurie [name] presents for  Chief Complaint  Patient presents with   Medical Management of Chronic Issues   1) HTN:  Patient is taking her medications  and notes no adverse effects.  Home BP readings have been done about once per week and are  generally < 130/80 .  She is avoiding added salt in her diet and walking regularly about 3 times per week for exercise  . taking lisinopril  80 mg daily  fo unclear reasons:  does not check at home   2) Treated for UTI in March by another office.  POCT had BLOOD ,  urine culture was negative.  Symptoms were frequency and dysuria .  Using trospiurm for OAB and estrace  for atrophy  3) Prediabetes :  A1c 6.2 in August all other labs normal.  Diet reviewed.  She does not drink soft drinks  follows a low GI diet   4) she is s/p left TKR in May  doing fine.  Full active.  Very careful to avoid fallin g  5) hernia repair umbilical in February by Cesar Coe.  She is bothered by her flabby abdomen    6)    Outpatient Medications Prior to Visit  Medication Sig Dispense Refill   acetaminophen  (TYLENOL ) 500 MG tablet Take 500 mg by mouth every 6 (six) hours as needed.     Calcium  Carbonate-Vit D-Min (CALCIUM  1200 PO) Take 1,200 mg by mouth 2 (two) times daily.     chlorpheniramine (CHLOR-TRIMETON) 4 MG tablet Take 4 mg by mouth 2 (two) times daily as needed for rhinitis.     cholecalciferol (VITAMIN D3) 25 MCG (1000 UNIT) tablet Take 1,000 Units by mouth daily.     diltiazem  (DILT-XR) 180 MG 24 hr capsule TAKE 1 CAPSULE BY MOUTH EVERY DAY 90 capsule 3   estradiol  (ESTRACE ) 0.1 MG/GM vaginal cream Apply a small amount to urethra with fingertip  nightly for 2 weeks,  then twice weekly thereafter 42.5 g 12   fish oil-omega-3 fatty acids 1000 MG capsule Take 1 g by mouth daily.      Multiple Vitamins-Minerals (PRESERVISION AREDS 2 PO) Take 1 capsule by mouth daily.     Polyethyl Glycol-Propyl Glycol (SYSTANE) 0.4-0.3 % SOLN Place 1 drop into both eyes daily as needed (Dry eye).     Rivaroxaban  (XARELTO ) 15 MG TABS tablet Take 1 tablet (15 mg total) by mouth daily. 30 tablet 2   rosuvastatin  (CRESTOR ) 20 MG tablet TAKE 1 TABLET BY MOUTH 2 TIMES A WEEK GENERIC EQUIVALENT FOR CRESTOR  26 tablet 1   trospium  (SANCTURA ) 20 MG tablet TAKE 1 TABLET BY MOUTH 2 TIMES DAILY 180 tablet 1   lisinopril  (ZESTRIL ) 40 MG tablet TAKE 1 TABLET BY MOUTH 2 TIMES DAILY 180 tablet 0   phenazopyridine  (PYRIDIUM ) 100 MG tablet Take 1 tablet (100 mg total) by mouth 3 (three) times daily as needed for pain. (Patient not taking: Reported on 02/19/2024) 10 tablet 0   traMADol  (ULTRAM ) 50 MG tablet Take 1 tablet (50 mg total) by mouth every 6 (six) hours as needed. (Patient not taking: Reported on 02/19/2024) 20 tablet 0   No facility-administered medications prior to visit.  Review of Systems;  Patient denies headache, fevers, malaise, unintentional weight loss, skin rash, eye pain, sinus congestion and sinus pain, sore throat, dysphagia,  hemoptysis , cough, dyspnea, wheezing, chest pain, palpitations, orthopnea, edema, abdominal pain, nausea, melena, diarrhea, constipation, flank pain, dysuria, hematuria, urinary  Frequency, nocturia, numbness, tingling, seizures,  Focal weakness, Loss of consciousness,  Tremor, insomnia, depression, anxiety, and suicidal ideation.      Objective:  BP (!) 162/78   Pulse 82   Temp 97.6 F (36.4 C) (Oral)   Ht 5' 4 (1.626 m)   Wt 134 lb 3.2 oz (60.9 kg)   SpO2 98%   BMI 23.04 kg/m   BP Readings from Last 3 Encounters:  02/19/24 (!) 162/78  08/15/23 134/68  07/29/23 (!) 150/68    Wt Readings from Last 3 Encounters:   02/19/24 134 lb 3.2 oz (60.9 kg)  08/15/23 137 lb (62.1 kg)  07/29/23 131 lb 8 oz (59.6 kg)    Physical Exam Vitals reviewed.  Constitutional:      General: She is not in acute distress.    Appearance: Normal appearance. She is normal weight. She is not ill-appearing, toxic-appearing or diaphoretic.  HENT:     Head: Normocephalic.  Eyes:     General: No scleral icterus.       Right eye: No discharge.        Left eye: No discharge.     Conjunctiva/sclera: Conjunctivae normal.  Cardiovascular:     Rate and Rhythm: Normal rate and regular rhythm.     Pulses: Normal pulses.     Heart sounds: Murmur heard.     Systolic murmur is present with a grade of 2/6.  Pulmonary:     Effort: Pulmonary effort is normal. No respiratory distress.     Breath sounds: Normal breath sounds.  Abdominal:     General: Bowel sounds are normal.     Palpations: Abdomen is soft.   Musculoskeletal:        General: Normal range of motion.     Right lower leg: No edema.     Left lower leg: No edema.  Skin:    General: Skin is warm and dry.  Neurological:     General: No focal deficit present.     Mental Status: She is alert and oriented to person, place, and time. Mental status is at baseline.  Psychiatric:        Mood and Affect: Mood normal.        Behavior: Behavior normal.        Thought Content: Thought content normal.        Judgment: Judgment normal.     Lab Results  Component Value Date   HGBA1C 6.2 01/15/2024   HGBA1C 5.9 11/26/2022   HGBA1C 6.1 11/20/2021    Lab Results  Component Value Date   CREATININE 0.71 01/15/2024   CREATININE 0.73 07/04/2023   CREATININE 0.87 11/26/2022    Lab Results  Component Value Date   WBC 4.6 01/15/2024   HGB 12.4 01/15/2024   HCT 37.2 01/15/2024   PLT 167.0 01/15/2024   GLUCOSE 99 01/15/2024   CHOL 119 01/15/2024   TRIG 106.0 01/15/2024   HDL 52.40 01/15/2024   LDLDIRECT 50.0 01/15/2024   LDLCALC 46 01/15/2024   ALT 14 01/15/2024    AST 16 01/15/2024   NA 138 01/15/2024   K 3.9 01/15/2024   CL 102 01/15/2024   CREATININE 0.71 01/15/2024   BUN 13 01/15/2024  CO2 30 01/15/2024   TSH 1.77 01/15/2024   INR 1.0 11/16/2011   HGBA1C 6.2 01/15/2024    No results found.  Assessment & Plan:  .Dysuria -     Urinalysis, Routine w reflex microscopic; Future -     Urine Culture; Future  Systolic murmur Assessment & Plan: Not previously noted.  ECHO ordered   Orders: -     ECHOCARDIOGRAM COMPLETE; Future  Primary hypertension Assessment & Plan: Previously Well controlled on current regimen of diltiazem  and lisinopril . But her dose of lisinopril  is too high.  Reducing dose to 40 mg daily   She prefers to continue ACE Inhibitor over ARB   . Renal function assessment is due , no changes today.  Home readings requested.   Lab Results  Component Value Date   CREATININE 0.71 01/15/2024   Lab Results  Component Value Date   NA 138 01/15/2024   K 3.9 01/15/2024   CL 102 01/15/2024   CO2 30 01/15/2024      Umbilical hernia without obstruction and without gangrene Assessment & Plan: S/p repair Feb 2025.  By  Junnie Coe    Prediabetes Assessment & Plan: Cholesterol is fine,  But her fasting glucose was elevated.(not diagnostic of diabetes but not normal).  Recommended she start exercising regularly if sh eis not already and eliminating unnecessary starches from her diet (read about low glycemic diets)      I personally spent a total of 30 minutes in the care of the patient today including getting/reviewing separately obtained history, performing a medically appropriate exam/evaluation, counseling and educating, placing orders, documenting clinical information in the EHR, and communicating results.  Follow-up: No follow-ups on file.   Verneita LITTIE Kettering, MD

## 2024-02-20 ENCOUNTER — Telehealth: Payer: Self-pay | Admitting: Internal Medicine

## 2024-02-20 NOTE — Telephone Encounter (Signed)
 Prescription Request  02/20/2024  LOV: 02/19/2024  What is the name of the medication or equipment?  lisinopril  (ZESTRIL ) 40 MG tablet   Have you contacted your pharmacy to request a refill? No   Which pharmacy would you like this sent to?  [id number] [place], [place] - [address] AT [facility] [address] [place] [address] Phone: [phone] Fax: [phone]  [facility] [address], [place] - [address] [facility] [place] [address] Phone: [phone] Fax: [phone]  [facility] - [place], [place] - [address] [facility] [address] [facility] [place] [address] Phone: [phone] Fax: [phone]    Patient notified that their request is being sent to the clinical staff for review and that they should receive a response within 2 business days.   Please advise at [facility] [phone]   **This medication has been sent to [facility] in [place], Pt states she prefers to refill any meds via [employer] listed above. Please reach out to pt with anything further

## 2024-02-20 NOTE — Telephone Encounter (Signed)
 Noted

## 2024-02-21 MED ORDER — LISINOPRIL 40 MG PO TABS: 40.0000 mg | ORAL_TABLET | Freq: Every day | ORAL | 1 refills | Status: DC

## 2024-02-21 NOTE — Addendum Note (Signed)
 Addended by: Aariyah Sampey on: 02/21/2024 08:21 AM   Modules accepted: Orders

## 2024-02-21 NOTE — Telephone Encounter (Signed)
 Refill sent.

## 2024-02-22 DIAGNOSIS — I351 Nonrheumatic aortic (valve) insufficiency: Secondary | ICD-10-CM | POA: Insufficient documentation

## 2024-02-22 DIAGNOSIS — R011 Cardiac murmur, unspecified: Secondary | ICD-10-CM | POA: Insufficient documentation

## 2024-02-22 DIAGNOSIS — R7303 Prediabetes: Secondary | ICD-10-CM | POA: Insufficient documentation

## 2024-02-22 NOTE — Assessment & Plan Note (Addendum)
 Previously Well controlled on current regimen of diltiazem  and lisinopril . But her dose of lisinopril  (80 mg )  is too high.  Reducing dose to 40 mg daily   She prefers to continue ACE Inhibitor over ARB   . Renal function assessment is due , no changes today.  Home readings requested. Have been reviewed from sept 17 to sept 30 and are elevated.  Adding hydralazine 25 mg twice daily  Lab Results  Component Value Date   CREATININE 0.71 01/15/2024   Lab Results  Component Value Date   NA 138 01/15/2024   K 3.9 01/15/2024   CL 102 01/15/2024   CO2 30 01/15/2024

## 2024-02-22 NOTE — Assessment & Plan Note (Signed)
 Not previously noted.  ECHO ordered

## 2024-02-22 NOTE — Assessment & Plan Note (Signed)
Cholesterol is fine,  But her fasting glucose was elevated.(not diagnostic of diabetes but not normal).  Recommended she start exercising regularly if sh eis not already and eliminating unnecessary starches from her diet (read about low glycemic diets).   

## 2024-02-22 NOTE — Assessment & Plan Note (Signed)
 S/p repair Feb 2025.  By  Cinron Diaz

## 2024-02-24 ENCOUNTER — Other Ambulatory Visit: Payer: Self-pay | Admitting: Internal Medicine

## 2024-03-03 ENCOUNTER — Telehealth: Payer: Self-pay

## 2024-03-03 DIAGNOSIS — R300 Dysuria: Secondary | ICD-10-CM | POA: Diagnosis not present

## 2024-03-03 NOTE — Addendum Note (Signed)
 Addended by: TANDA HARVEY D on: 03/03/2024 03:44 PM   Modules accepted: Orders

## 2024-03-03 NOTE — Telephone Encounter (Signed)
 Pt dropped off some bp readings. I have placed them in the yellow folder.

## 2024-03-04 ENCOUNTER — Ambulatory Visit: Payer: Self-pay | Admitting: Internal Medicine

## 2024-03-04 MED ORDER — HYDRALAZINE HCL 25 MG PO TABS: 25.0000 mg | ORAL_TABLET | Freq: Three times a day (TID) | ORAL | 1 refills | Status: DC

## 2024-03-04 NOTE — Addendum Note (Signed)
 Addended by: MARYLYNN VERNEITA CROME on: 03/04/2024 01:02 PM   Modules accepted: Orders

## 2024-03-06 LAB — URINALYSIS, ROUTINE W REFLEX MICROSCOPIC
Bacteria, UA: NONE SEEN /HPF
Bilirubin Urine: NEGATIVE
Glucose, UA: NEGATIVE
Hgb urine dipstick: NEGATIVE
Ketones, ur: NEGATIVE
Nitrite: NEGATIVE
Specific Gravity, Urine: 1.014 (ref 1.001–1.035)
WBC, UA: NONE SEEN /HPF (ref 0–5)
pH: 6.5 (ref 5.0–8.0)

## 2024-03-06 LAB — MICROSCOPIC MESSAGE

## 2024-03-06 LAB — URINE CULTURE
MICRO NUMBER:: 17036088
SPECIMEN QUALITY:: ADEQUATE

## 2024-03-10 NOTE — Progress Notes (Signed)
 LVM for pt to call office back to get results. When she call back okay to give results and document

## 2024-03-10 NOTE — Telephone Encounter (Signed)
 Copied from CRM #[id number]. Topic: Clinical - Lab/Test Results >> Mar 10, 2024  2:31 PM [place] M wrote: Reason for CRM: Spoke to Patient and let her know results of urinalysis. She is not having any burning sensation or urgency.

## 2024-03-12 ENCOUNTER — Ambulatory Visit: Payer: Self-pay

## 2024-03-12 ENCOUNTER — Other Ambulatory Visit: Payer: Self-pay

## 2024-03-12 ENCOUNTER — Emergency Department: Admission: EM | Admit: 2024-03-12 | Discharge: 2024-03-12 | Disposition: A | Discharge status: Home / Self Care

## 2024-03-12 ENCOUNTER — Encounter: Payer: Self-pay | Admitting: Emergency Medicine

## 2024-03-12 ENCOUNTER — Emergency Department

## 2024-03-12 ENCOUNTER — Ambulatory Visit: Admitting: Nurse Practitioner

## 2024-03-12 DIAGNOSIS — Z7901 Long term (current) use of anticoagulants: Secondary | ICD-10-CM | POA: Diagnosis not present

## 2024-03-12 DIAGNOSIS — I4891 Unspecified atrial fibrillation: Secondary | ICD-10-CM | POA: Diagnosis not present

## 2024-03-12 DIAGNOSIS — R42 Dizziness and giddiness: Secondary | ICD-10-CM

## 2024-03-12 DIAGNOSIS — I482 Chronic atrial fibrillation, unspecified: Secondary | ICD-10-CM | POA: Insufficient documentation

## 2024-03-12 DIAGNOSIS — T465X5A Adverse effect of other antihypertensive drugs, initial encounter: Secondary | ICD-10-CM

## 2024-03-12 DIAGNOSIS — R002 Palpitations: Secondary | ICD-10-CM | POA: Diagnosis not present

## 2024-03-12 DIAGNOSIS — I517 Cardiomegaly: Secondary | ICD-10-CM | POA: Diagnosis not present

## 2024-03-12 LAB — BASIC METABOLIC PANEL WITH GFR
Anion gap: 7 (ref 5–15)
BUN: 20 mg/dL (ref 8–23)
CO2: 25 mmol/L (ref 22–32)
Calcium: 9.6 mg/dL (ref 8.9–10.3)
Chloride: 105 mmol/L (ref 98–111)
Creatinine, Ser: 0.98 mg/dL (ref 0.44–1.00)
GFR, Estimated: 54 mL/min — ABNORMAL LOW (ref >60)
Glucose, Bld: 110 mg/dL — ABNORMAL HIGH (ref 70–99)
Potassium: 3.7 mmol/L (ref 3.5–5.1)
Sodium: 137 mmol/L (ref 135–145)

## 2024-03-12 LAB — TROPONIN I (HIGH SENSITIVITY)
Troponin I (High Sensitivity): 23 ng/L — ABNORMAL HIGH (ref <18)
Troponin I (High Sensitivity): 21 ng/L — ABNORMAL HIGH (ref <18)

## 2024-03-12 LAB — CBC
HCT: 39.2 % (ref 36.0–46.0)
Hemoglobin: 12.6 g/dL (ref 12.0–15.0)
MCH: 30.1 pg (ref 26.0–34.0)
MCHC: 32.1 g/dL (ref 30.0–36.0)
MCV: 93.8 fL (ref 80.0–100.0)
Platelets: 170 K/uL (ref 150–400)
RBC: 4.18 MIL/uL (ref 3.87–5.11)
RDW: 13.9 % (ref 11.5–15.5)
WBC: 5.1 K/uL (ref 4.0–10.5)
nRBC: 0 % (ref 0.0–0.2)

## 2024-03-12 LAB — MAGNESIUM: Magnesium: 2.3 mg/dL (ref 1.7–2.4)

## 2024-03-12 LAB — TSH: TSH: 1.816 u[IU]/mL (ref 0.350–4.500)

## 2024-03-12 MED ORDER — HYDRALAZINE HCL 20 MG/ML IJ SOLN
5.0000 mg | Freq: Once | INTRAMUSCULAR | Status: AC
  Administered 2024-03-12: 5 mg via INTRAVENOUS
  Filled 2024-03-12: qty 1

## 2024-03-12 MED ORDER — LISINOPRIL 10 MG PO TABS
40.0000 mg | ORAL_TABLET | Freq: Once | ORAL | Status: AC
  Administered 2024-03-12: 40 mg via ORAL
  Filled 2024-03-12: qty 4

## 2024-03-12 NOTE — Telephone Encounter (Addendum)
 Pt called in to let us  know that she started taking Hydralazine on Monday. Since then pt has developed bruises on her hands and isn't sure why. She stated that she has been having heart palpitations in the evenings and developed lightheadedness this morning. Pt was advised to go to the ED but she refused stating that she has no one to go with her.   PAULETTE FOX NEEDS TO CLOSE THE NOTE

## 2024-03-12 NOTE — Discharge Instructions (Signed)
 You were seen in the emergency department for intermittent episodes of lightheadedness and palpitations after starting the new blood pressure medication.  Workup and exam was reassuring today.  I suspect your symptoms are secondary to starting the new medication which may take time to equilibrate.  I do think it is important to follow-up with your cardiology team.  I have placed a referral but please give them a call this week to arrange a semiurgent follow-up appointment.  Please follow-up with your primary care physician.  Return with any acutely worsening symptoms or any other emergency -- RETURN PRECAUTIONS & AFTERCARE: (ENGLISH) RETURN PRECAUTIONS: Return immediately to the emergency department or see/call your doctor if you feel worse, weak or have changes in speech or vision, are short of breath, have fever, vomiting, pain, bleeding or dark stool, trouble urinating or any new issues. Return here or see/call your doctor if not improving as expected for your suspected condition. FOLLOW-UP CARE: Call your doctor and/or any doctors we referred you to for more advice and to make an appointment. Do this today, tomorrow or after the weekend. Some doctors only take PPO insurance so if you have HMO insurance you may want to contact your HMO or your regular doctor for referral to a specialist within your plan. Either way tell the doctor's office that it was a referral from the emergency department so you get the soonest possible appointment.  YOUR TEST RESULTS: Take result reports of any blood or urine tests, imaging tests and EKG's to your doctor and any referral doctor. Have any abnormal tests repeated. Your doctor or a referral doctor can let you know when this should be done. Also make sure your doctor contacts this hospital to get any test results that are not currently available such as cultures or special tests for infection and final imaging reports, which are often not available at the time you leave the  ER but which may list additional important findings that are not documented on the preliminary report. BLOOD PRESSURE: If your blood pressure was greater than 120/80 have your blood pressure rechecked within 1 to 2 weeks. MEDICATION SIDE EFFECTS: Do not drive, walk, bike, take the bus, etc. if you have received or are being prescribed any sedating medications such as those for pain or anxiety or certain antihistamines like Benadryl. If you have been give one of these here get a taxi home or have a friend drive you home. Ask your pharmacist to counsel you on potential side effects of any new medication

## 2024-03-12 NOTE — Telephone Encounter (Signed)
 Spoke with pt and she stated that she is currently at the ED waiting to be seen. Pt was advised of Dr. Lula message. Pt stated that she did take a hydralazine this morning. Pt was advised that she should stay at the ED for evaluation of the lightheadedness and heart palpitations she has been having. Pt gave a verbal understanding.

## 2024-03-12 NOTE — ED Notes (Signed)
 Pt discharged to home, instructions reviewed and pt verbalized understaqnding.  No questions at this time.

## 2024-03-12 NOTE — ED Provider Notes (Signed)
 [facility] Provider Note    Event Date/Time   First MD Initiated Contact with Patient 03/12/24 1807     (approximate)   History   Dizziness and Palpitations   HPI  Laurie [name] is a 89 y.o. female paroxysmal A-fib on Xarelto , hypertension, prediabetes      Physical Exam   Triage Vital Signs: ED Triage Vitals  Encounter Vitals Group     BP 03/12/24 1402 (!) 153/86     Girls Systolic BP Percentile --      Girls Diastolic BP Percentile --      Boys Systolic BP Percentile --      Boys Diastolic BP Percentile --      Pulse Rate 03/12/24 1402 96     Resp 03/12/24 1402 18     Temp 03/12/24 1402 97.6 F (36.4 C)     Temp Source 03/12/24 1402 Oral     SpO2 03/12/24 1402 96 %     Weight 03/12/24 1400 132 lb 4.4 oz (60 kg)     Height 03/12/24 1400 5' 4 (1.626 m)     Head Circumference --      Peak Flow --      Pain Score 03/12/24 1401 0     Pain Loc --      Pain Education --      Exclude from Growth Chart --     Most recent vital signs: Vitals:   03/12/24 1402  BP: (!) 153/86  Pulse: 96  Resp: 18  Temp: 97.6 F (36.4 C)  SpO2: 96%    Nursing Triage Note reviewed. Vital signs reviewed and patients oxygen saturation is normoxic***  General: Patient is well nourished, well developed, awake and alert, resting comfortably in no acute distress Head: Normocephalic and atraumatic Eyes: Normal inspection, extraocular muscles intact, no conjunctival pallor Ear, nose, throat: Normal external exam Neck: Normal range of motion Respiratory: Patient is in no respiratory distress, lungs CTAB Cardiovascular: Patient is not tachycardic, RRR without murmur appreciated GI: Abd SNT with no guarding or rebound  Back: Normal inspection of the back with good strength and range of motion throughout all ext Extremities: pulses intact with good cap refills, no LE pitting edema or calf tenderness Neuro: The patient is alert and oriented to person, place,  and time, appropriately conversive, with 5/5 bilat UE/LE strength, no gross motor or sensory defects noted. Coordination appears to be adequate. Skin: Warm, dry, and intact Psych: normal mood and affect, no SI or HI  ED Results / Procedures / Treatments   Labs (all labs ordered are listed, but only abnormal results are displayed) Labs Reviewed  BASIC METABOLIC PANEL WITH GFR - Abnormal; Notable for the following components:      Result Value   Glucose, Bld 110 (*)    GFR, Estimated 54 (*)    All other components within normal limits  TROPONIN I (HIGH SENSITIVITY) - Abnormal; Notable for the following components:   Troponin I (High Sensitivity) 23 (*)    All other components within normal limits  CBC  TROPONIN I (HIGH SENSITIVITY)     EKG   RADIOLOGY ***    PROCEDURES:  Critical Care performed: {CriticalCareYesNo:19197::Yes, see critical care procedure note(s),No}  Procedures   MEDICATIONS ORDERED IN ED: Medications - No data to display   IMPRESSION / MDM / ASSESSMENT AND PLAN / ED COURSE  Differential diagnosis includes, but is not limited to, ***    ***     -- Risk: 5 This patient has a high risk of morbidity due to further diagnostic testing or treatment. Rationale: This patient's evaluation and management involve a high risk of morbidity due to the potential severity of presenting symptoms, need for diagnostic testing, and/or initiation of treatment that may require close monitoring. The differential includes conditions with potential for significant deterioration or requiring escalation of care. Treatment decisions in the ED, including medication administration, procedural interventions, or disposition planning, reflect this level of risk. COPA: 5 The patient has the following acute or chronic illness/injury that poses a possible threat to life or bodily function: [X] : The patient has a potentially serious acute condition  or an acute exacerbation of a chronic illness requiring urgent evaluation and management in the Emergency Department. The clinical presentation necessitates immediate consideration of life-threatening or function-threatening diagnoses, even if they are ultimately ruled out.   FINAL CLINICAL IMPRESSION(S) / ED DIAGNOSES   Final diagnoses:  None     Rx / DC Orders   ED Discharge Orders     None        Note:  This document was prepared using Dragon voice recognition software and may include unintentional dictation errors.

## 2024-03-12 NOTE — ED Triage Notes (Signed)
 Pt has been taking new HTN (hydralazine) meds since beginning of month. States she is now having heart palpations, lightheadedness and bruising to hand. Denies pain or blurred vision.

## 2024-03-12 NOTE — Telephone Encounter (Signed)
 FYI Only or Action Required?: Action required by provider: request for appointment.  Patient was last seen in primary care on 02/19/2024 by Marylynn Verneita CROME, MD.  Called Nurse Triage reporting Palpitations. - today had lightheadedness.  Symptoms began several days ago.  Interventions attempted: Nothing.  Symptoms are: gradually worsening.  Triage Disposition: See HCP Within 4 Hours (Or PCP Triage)- - Clinical at office advised ED  Patient/caregiver understands and will follow disposition?: No Pt has stated she does not want to go to the Ed and will only go if someone can go with her. Called [facility] to advise of refusal.                    Copied from CRM [id number]. Topic: Clinical - Red Word Triage >> Mar 12, 2024 12:14 PM Laurie [name] wrote: Red Word that prompted transfer to Nurse Triage: hydrALAZINE (APRESOLINE) 25 MG tablet. Patient has been taking it since Monday. Patient has bruises on her hands (unexplained and not getting lighter), and feeling lightheaded as well. In the evening, feels heart palpitations. Reason for Disposition  [1] Heart beating very rapidly (e.g., > 140 / minute) AND [2] NOT present now  (Exception: Only during exercise.)  Answer Assessment - Initial Assessment Questions Pt recently started hydrALAZINE (APRESOLINE) 25 MG tablet. She started experiencing palpitations Oct 7th. They occur each evening and last until she goes to bed. Today pt started with dizziness as well. No appts in office. Called [facility] to see if pt can we worked in. [facility] spoke with clinical  - they advised ED. PT does not want to go to ED.   1. DESCRIPTION: Please describe your heart rate or heartbeat that you are having (e.g., fast/slow, regular/irregular, skipped or extra beats, palpitations)     palpitations 2. ONSET: When did it start? (e.g., minutes, hours, days)      Oct 7th in the evenings 3. DURATION: How long does it last (e.g., seconds, minutes, hours)     For awhile 4.  PATTERN Does it come and go, or has it been constant since it started?  Does it get worse with exertion?   Are you feeling it now?     Comes and goes 6. HEART RATE: Can you tell me your heart rate? How many beats in 15 seconds?  Note: Not all patients can do this.       no 7. RECURRENT SYMPTOM: Have you ever had this before? If Yes, ask: When was the last time? and What happened that time?      Last night 8. CAUSE: What do you think is causing the palpitations?     unsure 9. CARDIAC HISTORY: Do you have any history of heart disease? (e.g., heart attack, angina, bypass surgery, angioplasty, arrhythmia)      Sees  cardiology 10. OTHER SYMPTOMS: Do you have any other symptoms? (e.g., dizziness, chest pain, sweating, difficulty breathing)       Lightheaded today - just earlier today.  Protocols used: Heart Rate and Heartbeat Questions-A-AH

## 2024-03-13 NOTE — Telephone Encounter (Unsigned)
 Copied from CRM [id number]. Topic: General - Other >> Mar 13, 2024  9:15 AM Burnard DEL wrote: Reason for CRM: Patient went to ED as advised by provider.They advised her to follow up with PCP today.However there are no appointments available.Patient would like to know what Dr Marylynn would like for her to do?

## 2024-03-16 NOTE — Telephone Encounter (Signed)
 Spoke with pt to see about scheduling her today at 11 am since we had a cancellation. Pt stated that she would not be able to make it so she kept her appt on 04/02/2024.

## 2024-03-23 ENCOUNTER — Encounter: Payer: Self-pay | Admitting: Internal Medicine

## 2024-03-31 ENCOUNTER — Encounter: Payer: Self-pay | Admitting: Physician Assistant

## 2024-03-31 ENCOUNTER — Ambulatory Visit: Attending: Physician Assistant | Admitting: Physician Assistant

## 2024-03-31 VITALS — BP 176/72 | HR 76 | Ht 64.0 in | Wt 135.8 lb

## 2024-03-31 DIAGNOSIS — I482 Chronic atrial fibrillation, unspecified: Secondary | ICD-10-CM

## 2024-03-31 DIAGNOSIS — R011 Cardiac murmur, unspecified: Secondary | ICD-10-CM | POA: Diagnosis not present

## 2024-03-31 DIAGNOSIS — I10 Essential (primary) hypertension: Secondary | ICD-10-CM

## 2024-03-31 DIAGNOSIS — E785 Hyperlipidemia, unspecified: Secondary | ICD-10-CM | POA: Diagnosis not present

## 2024-03-31 MED ORDER — DILTIAZEM HCL ER 240 MG PO CP24: ORAL_CAPSULE | ORAL | 3 refills | Status: DC

## 2024-03-31 NOTE — Patient Instructions (Signed)
 Medication Instructions:  Your physician recommends the following medication changes.  INCREASE: Diltiazem  240 mg daily   *If you need a refill on your cardiac medications before your next appointment, please call your pharmacy*  Lab Work: None ordered at this time   Testing/Procedures: BP log given  Follow-Up: At [facility], you and your health needs are our priority.  As part of our continuing mission to provide you with exceptional heart care, our providers are all part of one team.  This team includes your primary Cardiologist (physician) and Advanced Practice Providers or APPs (Physician Assistants and Nurse Practitioners) who all work together to provide you with the care you need, when you need it.  Your next appointment:   1 year(s)  Provider:   You may see Deatrice Cage, MD or one of the following Advanced Practice Providers on your designated Care Team:   Lonni Meager, NP Lesley Maffucci, PA-C Bernardino Bring, PA-C Cadence Verdigre, PA-C Tylene Lunch, NP Barnie Hila, NP

## 2024-03-31 NOTE — Progress Notes (Signed)
 Cardiology Office Note    Date:  03/31/2024   ID:  Annjeanette, Sarwar 1930/12/25, MRN [id number]  PCP:  Marylynn Verneita CROME, MD  Cardiologist:  Deatrice Cage, MD  Electrophysiologist:  None   Chief Complaint: ED follow up  History of Present Illness:   KIYANI JERNIGAN is a 89 y.o. female with history of coronary artery calcification, aortic atherosclerosis, permanent atrial fibrillation, hypertension, and hyperlipidemia who presents today for ED follow up.    Prior stress echo 09/2010 at [facility] demonstrated a hypertensive response to exercise without EKG changes suggestive of ischemia or symptoms concerning for angina.  There was no evidence of ischemia at rest or with stress.  Atrial fibrillation was diagnosed in 2013, though the exact onset was not clear as prior documentation indicates there was mention in her record at [facility] previously in 2012.  She has been managed with Xarelto  and diltiazem .  Given she was minimally symptomatic from A-fib, and with possible duration of atrial fibrillation, rate control strategy was pursued.  Echo at that time demonstrated EF of 55 to 60% with mild concentric hypertrophy, mild aortic insufficiency, mild MR, and mildly dilated left atrium.  CTA of the chest 06/2020 showed coronary artery calcification and aortic atherosclerosis.   Patient was most recently seen in our office 06/2023 overall doing well from a cardiac perspective without chest pain, shortness of breath, or palpitations.  No further testing or medication changes were indicated at that time.  Patient was seen by PCP 02/2024 and systolic murmur was noted on exam.  Echocardiogram was ordered for further evaluation but has not yet been completed.  Patient had a recent ED visit 03/12/2024 for dizziness and palpitations.  She reported 1 week of intermittent lightheadedness, palpitations, and bruising to the hands.  She had recently been started on a new blood pressure medication which she thought was  causing her symptoms.  BP was elevated at 153/86 with otherwise normal vital signs.  CBC, BMP, magnesium, and TSH unremarkable.  Troponin 23 > 21.  EKG revealed atrial fibrillation with a heart rate of 87 bpm and no acute ischemic changes.  Patient was given IV hydralazine for elevated blood pressure and discharged in stable condition with recommendation to follow-up with cardiology.  Patient presents today overall doing well from a cardiac perspective. She reports resolution of symptoms since discontinuing hydralazine. She takes BP occasionally at home with average readings around 160 systolic. She denies chest pain, shortness of breath, palpitations, lightheadedness, dizziness, and lower extremity swelling.   Labs independently reviewed: 03/2024-magnesium 2.3, TSH normal, Hgb 12.6, HCT 39.2, platelets 170, sodium 137, potassium 3.7, BUN 20, creatinine 0.98 01/2024-TC 119, TG 106, HDL 52, LDL 46  Objective   Past Medical History:  Diagnosis Date   Acquired thrombophilia    Aortic atherosclerosis    Arthritis    Bilateral carotid artery disease    a.) carotid doppler 10/22/2008: 30-40% LICA; b.) carotid doppler 01/01/2012: 1-39% BICA   Chronic atrial fibrillation (HCC)    a.) CHA2DS2VASc = 5 (age x2, sex, HTN, vascular disease) as of 07/26/2023; b.) rate/rhythm maintained on oral diltiazem ; chronically anticoagulated with rivaroxaban    Coronary artery calcification seen on CT scan    Essential hypertension    History of 2019 novel coronavirus disease (COVID-19)    Hyperlipidemia, unspecified hyperlipidemia type    Hypertension    On rivaroxaban  therapy    Overactive bladder    Plantar fasciitis    Pneumonia due to COVID-19 virus 06/28/2020  RBBB (right bundle branch block)    Umbilical hernia without obstruction and without gangrene     Current Medications: Current Meds  Medication Sig   acetaminophen  (TYLENOL ) 500 MG tablet Take 500 mg by mouth every 6 (six) hours as needed.    Calcium  Carbonate-Vit D-Min (CALCIUM  1200 PO) Take 1,200 mg by mouth 2 (two) times daily.   chlorpheniramine (CHLOR-TRIMETON) 4 MG tablet Take 4 mg by mouth 2 (two) times daily as needed for rhinitis.   cholecalciferol (VITAMIN D3) 25 MCG (1000 UNIT) tablet Take 1,000 Units by mouth daily.   estradiol  (ESTRACE ) 0.1 MG/GM vaginal cream Apply a small amount to urethra with fingertip nightly for 2 weeks,  then twice weekly thereafter   fish oil-omega-3 fatty acids 1000 MG capsule Take 1 g by mouth daily.    lisinopril  (ZESTRIL ) 40 MG tablet Take 1 tablet (40 mg total) by mouth daily.   Multiple Vitamins-Minerals (PRESERVISION AREDS 2 PO) Take 1 capsule by mouth daily.   Polyethyl Glycol-Propyl Glycol (SYSTANE) 0.4-0.3 % SOLN Place 1 drop into both eyes daily as needed (Dry eye).   Rivaroxaban  (XARELTO ) 15 MG TABS tablet Take 1 tablet (15 mg total) by mouth daily.   rosuvastatin  (CRESTOR ) 20 MG tablet TAKE 1 TABLET BY MOUTH 2 TIMES A WEEK GENERIC EQUIVALENT FOR CRESTOR    trospium  (SANCTURA ) 20 MG tablet TAKE 1 TABLET BY MOUTH 2 TIMES DAILY   [DISCONTINUED] diltiazem  (DILT-XR) 180 MG 24 hr capsule TAKE 1 CAPSULE BY MOUTH EVERY DAY    Allergies:   Hydrochlorothiazide, Losartan, Metoprolol, and Triamterene   Social History   Socioeconomic History   Marital status: Widowed    Spouse name: Not on file   Number of children: Not on file   Years of education: Not on file   Highest education level: Not on file  Occupational History   Not on file  Tobacco Use   Smoking status: Never   Smokeless tobacco: Never  Vaping Use   Vaping status: Never Used  Substance and Sexual Activity   Alcohol use: No   Drug use: No   Sexual activity: Never  Other Topics Concern   Not on file  Social History Narrative   Not on file   Social Drivers of Health   Financial Resource Strain: Low Risk  (07/11/2023)   Received from [facility]   Overall Financial Resource Strain (CARDIA)     Difficulty of Paying Living Expenses: Not very hard  Food Insecurity: No Food Insecurity (07/11/2023)   Received from [facility]   Hunger Vital Sign    Within the past 12 months, you worried that your food would run out before you got the money to buy more.: Never true    Within the past 12 months, the food you bought just didn't last and you didn't have money to get more.: Never true  Transportation Needs: No Transportation Needs (07/11/2023)   Received from [facility] - Transportation    In the past 12 months, has lack of transportation kept you from medical appointments or from getting medications?: No    Lack of Transportation (Non-Medical): No  Physical Activity: Inactive (04/22/2023)   Exercise Vital Sign    Days of Exercise per Week: 0 days    Minutes of Exercise per Session: 0 min  Stress: No Stress Concern Present (04/22/2023)   [employer] - Occupational Stress Questionnaire    Feeling of Stress :  Not at all  Social Connections: Moderately Integrated (04/22/2023)   Social Connection and Isolation Panel    Frequency of Communication with Friends and Family: Three times a week    Frequency of Social Gatherings with Friends and Family: Three times a week    Attends Religious Services: More than 4 times per year    Active Member of Clubs or Organizations: Yes    Attends Banker Meetings: More than 4 times per year    Marital Status: Widowed     Family History:  The patient's family history includes Anemia in her mother; Heart disease (age of onset: 32) in her father; Kidney disease in her mother.  ROS:   12-point review of systems is negative unless otherwise noted in the HPI.  EKGs/Other Studies Reviewed:    Studies reviewed were summarized above. The additional studies were reviewed today:  2D echo 11/28/2011: - Left ventricle: The cavity size was normal. There was mild    concentric  hypertrophy. Systolic function was normal. The    estimated ejection fraction was in the range of 55% to    60%. Wall motion was normal; there were no regional wall    motion abnormalities. The study is not technically    sufficient to allow evaluation of LV diastolic function.  - Aortic valve: Mild regurgitation.  - Mitral valve: Mild regurgitation.  - Left atrium: The atrium was mildly dilated.  - Pulmonary arteries: Systolic pressure was mildly elevated.    PA peak pressure: 36mm Hg (S).  Impressions:   - Rhythm noted to be atrial fibrillation.  EKG:  EKG personally reviewed by me today EKG Interpretation Date/Time:  Tuesday March 31 2024 08:58:21 EDT Ventricular Rate:  76 PR Interval:    QRS Duration:  128 QT Interval:  424 QTC Calculation: 477 R Axis:   -36  Text Interpretation: Atrial fibrillation Left axis deviation Right bundle branch block Septal infarct (cited on or before 27-Jun-2020) When compared with ECG of 12-Mar-2024 14:06, Right bundle branch block is now Present Questionable change in initial forces of Anterior leads Confirmed by Lorene Sinclair ([id number]) on 03/31/2024 9:22:16 AM  PHYSICAL EXAM:    VS:  BP (!) 176/72 (BP Location: Left Arm, Patient Position: Sitting, Cuff Size: Normal)   Pulse 76 Comment: 76 oximeter  Ht 5' 4 (1.626 m)   Wt 135 lb 12.8 oz (61.6 kg)   SpO2 99%   BMI 23.31 kg/m   BMI: Body mass index is 23.31 kg/m.  GEN: Well nourished, well developed in no acute distress NECK: No JVD; No carotid bruits CARDIAC: IRIR, I/VI systolic murmur, no rubs or gallops RESPIRATORY:  Clear to auscultation without rales, wheezing or rhonchi  ABDOMEN: Soft, non-tender, non-distended EXTREMITIES: No edema; No deformity  Wt Readings from Last 3 Encounters:  03/31/24 135 lb 12.8 oz (61.6 kg)  03/12/24 132 lb 4.4 oz (60 kg)  02/19/24 134 lb 3.2 oz (60.9 kg)                  ASSESSMENT & PLAN:   Essential hypertension - Recent ED visit secondary  to adverse effect from hydralazine. BP continues to run high. Today 180/70 and 176/72 on recheck. Reported home readings 160s systolic. I will increase diltiazem  to 240 mg daily. She is continued on lisinopril  40 mg daily. I have provided her with a BP log. She has follow up with PCP for further management of hypertension.   Chronic atrial fibrillation - Asymptomatic and rate  controlled. Increase diltiazem  as above. She is continued on Xarelto  15 mg daily. Denies bleeding and hematochezia.   Hyperlipidemia - Most recent lipid panel 01/2024 with LDL 46, at goal.  She is continued on rosuvastatin  20 mg twice per week.  Systolic murmur - I/VI systolic murmur noted on exam. Echocardiogram has been ordered by PCP.    Disposition: Increase diltiazem  to 240 mg daily.  BP log provided.  Follow-up with PCP for ongoing management of hypertension.  F/u with Dr. Darron or an APP in 1 year.   Medication Adjustments/Labs and Tests Ordered: Current medicines are reviewed at length with the patient today.  Concerns regarding medicines are outlined above. Medication changes, Labs and Tests ordered today are summarized above and listed in the Patient Instructions accessible in Encounters.   SignedLesley Maffucci, PA-C 03/31/2024 9:45 AM     [facility] [place] [address] [place], [place] [address] [phone]

## 2024-04-02 ENCOUNTER — Encounter: Payer: Self-pay | Admitting: Internal Medicine

## 2024-04-02 ENCOUNTER — Ambulatory Visit (INDEPENDENT_AMBULATORY_CARE_PROVIDER_SITE_OTHER): Admitting: Internal Medicine

## 2024-04-02 VITALS — BP 160/70 | HR 58 | Ht 64.0 in | Wt 131.6 lb

## 2024-04-02 DIAGNOSIS — I10 Essential (primary) hypertension: Secondary | ICD-10-CM

## 2024-04-02 MED ORDER — DILTIAZEM HCL ER 240 MG PO CP24: ORAL_CAPSULE | ORAL | 0 refills | Status: DC

## 2024-04-02 NOTE — Progress Notes (Unsigned)
 Subjective:  Patient ID: Laurie [name], female    DOB: 07/18/1930  Age: 89 y.o. MRN: [id number]  CC: There were no encounter diagnoses.   HPI Kaeley M Fujimoto presents for  Chief Complaint  Patient presents with   Medical Management of Chronic Issues    Follow up on blood pressure    HTN:  lisinopril  40 mg daily  (reduced from 80 mg daily) diltiazem  dose increased by cardiology but still taking lower dose 180 mg dose  (waiting for pharmacy) ,  did not tolerate hydralazine due to swelling   Outpatient Medications Prior to Visit  Medication Sig Dispense Refill   acetaminophen  (TYLENOL ) 500 MG tablet Take 500 mg by mouth every 6 (six) hours as needed.     Calcium  Carbonate-Vit D-Min (CALCIUM  1200 PO) Take 1,200 mg by mouth 2 (two) times daily.     chlorpheniramine (CHLOR-TRIMETON) 4 MG tablet Take 4 mg by mouth 2 (two) times daily as needed for rhinitis.     cholecalciferol (VITAMIN D3) 25 MCG (1000 UNIT) tablet Take 1,000 Units by mouth daily.     estradiol  (ESTRACE ) 0.1 MG/GM vaginal cream Apply a small amount to urethra with fingertip nightly for 2 weeks,  then twice weekly thereafter 42.5 g 12   fish oil-omega-3 fatty acids 1000 MG capsule Take 1 g by mouth daily.      lisinopril  (ZESTRIL ) 40 MG tablet Take 1 tablet (40 mg total) by mouth daily. 90 tablet 1   Multiple Vitamins-Minerals (PRESERVISION AREDS 2 PO) Take 1 capsule by mouth daily.     Polyethyl Glycol-Propyl Glycol (SYSTANE) 0.4-0.3 % SOLN Place 1 drop into both eyes daily as needed (Dry eye).     Rivaroxaban  (XARELTO ) 15 MG TABS tablet Take 1 tablet (15 mg total) by mouth daily. 30 tablet 2   rosuvastatin  (CRESTOR ) 20 MG tablet TAKE 1 TABLET BY MOUTH 2 TIMES A WEEK GENERIC EQUIVALENT FOR CRESTOR  26 tablet 1   trospium  (SANCTURA ) 20 MG tablet TAKE 1 TABLET BY MOUTH 2 TIMES DAILY 180 tablet 1   diltiazem  (DILT-XR) 240 MG 24 hr capsule TAKE 1 CAPSULE BY MOUTH EVERY DAY (Patient not taking: Reported on 04/02/2024) 90 capsule  3   No facility-administered medications prior to visit.    Review of Systems;  Patient denies headache, fevers, malaise, unintentional weight loss, skin rash, eye pain, sinus congestion and sinus pain, sore throat, dysphagia,  hemoptysis , cough, dyspnea, wheezing, chest pain, palpitations, orthopnea, edema, abdominal pain, nausea, melena, diarrhea, constipation, flank pain, dysuria, hematuria, urinary  Frequency, nocturia, numbness, tingling, seizures,  Focal weakness, Loss of consciousness,  Tremor, insomnia, depression, anxiety, and suicidal ideation.      Objective:  BP (!) 160/70   Pulse (!) 58   Ht 5' 4 (1.626 m)   Wt 131 lb 9.6 oz (59.7 kg)   SpO2 95%   BMI 22.59 kg/m   BP Readings from Last 3 Encounters:  04/02/24 (!) 160/70  03/31/24 (!) 176/72  03/12/24 (!) 182/82    Wt Readings from Last 3 Encounters:  04/02/24 131 lb 9.6 oz (59.7 kg)  03/31/24 135 lb 12.8 oz (61.6 kg)  03/12/24 132 lb 4.4 oz (60 kg)    Physical Exam  Lab Results  Component Value Date   HGBA1C 6.2 01/15/2024   HGBA1C 5.9 11/26/2022   HGBA1C 6.1 11/20/2021    Lab Results  Component Value Date   CREATININE 0.98 03/12/2024   CREATININE 0.71 01/15/2024   CREATININE 0.73 07/04/2023  Lab Results  Component Value Date   WBC 5.1 03/12/2024   HGB 12.6 03/12/2024   HCT 39.2 03/12/2024   PLT 170 03/12/2024   GLUCOSE 110 (H) 03/12/2024   CHOL 119 01/15/2024   TRIG 106.0 01/15/2024   HDL 52.40 01/15/2024   LDLDIRECT 50.0 01/15/2024   LDLCALC 46 01/15/2024   ALT 14 01/15/2024   AST 16 01/15/2024   NA 137 03/12/2024   K 3.7 03/12/2024   CL 105 03/12/2024   CREATININE 0.98 03/12/2024   BUN 20 03/12/2024   CO2 25 03/12/2024   TSH 1.816 03/12/2024   INR 1.0 11/16/2011   HGBA1C 6.2 01/15/2024    DG Chest 2 View Result Date: 03/12/2024 CLINICAL DATA:  Palpitations EXAM: CHEST - 2 VIEW COMPARISON:  06/27/2020 FINDINGS: Cardiomegaly. Both lungs are clear. The visualized skeletal  structures are unremarkable. IMPRESSION: Cardiomegaly without acute abnormality of the lungs. Electronically Signed   By: Marolyn JONETTA Jaksch M.D.   On: 03/12/2024 14:38    Assessment & Plan:  .There are no diagnoses linked to this encounter.   I spent 34 minutes on the day of this face to face encounter reviewing patient's  most recent visit with cardiology,  nephrology,  and neurology,  prior relevant surgical and non surgical procedures, recent  labs and imaging studies, counseling on weight management,  reviewing the assessment and plan with patient, and post visit ordering and reviewing of  diagnostics and therapeutics with patient  .   Follow-up: No follow-ups on file.   Verneita LITTIE Kettering, MD

## 2024-04-02 NOTE — Patient Instructions (Addendum)
 Try the higher dose of diltiazem  (240 mg ) when it comes in  If [facility] doesn't call you by Friday,  you can take the rx I have given you to [facility]  for a 30 day supply

## 2024-04-03 NOTE — Assessment & Plan Note (Signed)
 Diltiazem  dose has been increased but not started yet.  No changes today..  did not tolerate hydralazine due to palpitations and dizziness.

## 2024-04-15 ENCOUNTER — Telehealth: Payer: Self-pay | Admitting: Cardiovascular Disease

## 2024-04-15 NOTE — Telephone Encounter (Signed)
 Pt c/o medication issue:  1. Name of Medication:   Rivaroxaban  (XARELTO ) 15 MG TABS tablet    2. How are you currently taking this medication (dosage and times per day)? As prescribed   3. Are you having a reaction (difficulty breathing--STAT)? No   4. What is your medication issue? Pt states her mail order service will not send her Xarelto  until there is an authorization for the newest Rx. Please advise.

## 2024-04-16 ENCOUNTER — Other Ambulatory Visit: Payer: Self-pay

## 2024-04-16 ENCOUNTER — Telehealth: Payer: Self-pay | Admitting: Pharmacy Technician

## 2024-04-16 ENCOUNTER — Other Ambulatory Visit (HOSPITAL_COMMUNITY): Payer: Self-pay

## 2024-04-16 ENCOUNTER — Ambulatory Visit: Attending: Internal Medicine

## 2024-04-16 DIAGNOSIS — I482 Chronic atrial fibrillation, unspecified: Secondary | ICD-10-CM

## 2024-04-16 DIAGNOSIS — R011 Cardiac murmur, unspecified: Secondary | ICD-10-CM

## 2024-04-16 LAB — ECHOCARDIOGRAM COMPLETE
AR max vel: 2.52 cm2
AV Area VTI: 2.52 cm2
AV Area mean vel: 2.47 cm2
AV Mean grad: 4 mmHg
AV Peak grad: 6.9 mmHg
Ao pk vel: 1.31 m/s
Area-P 1/2: 4.1 cm2
S' Lateral: 2.3 cm

## 2024-04-16 MED ORDER — RIVAROXABAN 15 MG PO TABS: 15.0000 mg | ORAL_TABLET | Freq: Every day | ORAL | 3 refills | Status: DC

## 2024-04-16 NOTE — Telephone Encounter (Signed)
 Laurie [name]

## 2024-04-16 NOTE — Telephone Encounter (Signed)
 Called patient to let her know the medication has been refilled. Will be sent via mail delivery via [facility]. Patient grateful, no further needs at this time.

## 2024-05-18 ENCOUNTER — Ambulatory Visit

## 2024-05-18 ENCOUNTER — Other Ambulatory Visit: Payer: Self-pay | Admitting: Internal Medicine

## 2024-05-18 ENCOUNTER — Encounter: Payer: Self-pay | Admitting: Internal Medicine

## 2024-05-18 ENCOUNTER — Telehealth: Payer: Self-pay | Admitting: *Deleted

## 2024-05-18 VITALS — Ht 64.0 in | Wt 130.0 lb

## 2024-05-18 DIAGNOSIS — I10 Essential (primary) hypertension: Secondary | ICD-10-CM

## 2024-05-18 DIAGNOSIS — Z0000 Encounter for general adult medical examination without abnormal findings: Secondary | ICD-10-CM | POA: Diagnosis not present

## 2024-05-18 MED ORDER — DILTIAZEM HCL ER COATED BEADS 180 MG PO CP24: 180.0000 mg | ORAL_CAPSULE | Freq: Every day | ORAL | 1 refills | Status: AC

## 2024-05-18 MED ORDER — LISINOPRIL 40 MG PO TABS: 40.0000 mg | ORAL_TABLET | Freq: Two times a day (BID) | ORAL | 1 refills | Status: AC

## 2024-05-18 NOTE — Assessment & Plan Note (Signed)
 NOT WELL CONTROLLED SINCE THE LISINOPRIL  DOSE WAS REDUCED TO 80 MG DAILY,  WHICH SHE WAS TOLERATING WITHOUT SIDE EFFECTS or change in GFR . She did not tolerate hydralazine  or the increased dose of diltiazem  of 240 mg daily.  Will resume 180 mg diltiazem  and resume lisinopril  40 mg bid.

## 2024-05-18 NOTE — Telephone Encounter (Signed)
 Performed AWV  While reviewing patient's medication she stated that a doctor had increased her Diltiazem  to 240 mg which she had been taking 180 for a long time. Patient stated when taking the increased dose she started vomiting and feeling terrible and only took the increase dose for about 3 days. Patient stated that she went back to Diltiazem  180 mg and has felt fine since. Patient stated that she had test done last month and never got the results and would like for someone to call her with the results.

## 2024-05-18 NOTE — Patient Instructions (Addendum)
 [name],  Thank you for taking the time for your Medicare Wellness Visit. I appreciate your continued commitment to your health goals. Please review the care plan we discussed, and feel free to reach out if I can assist you further.  Please note that Annual Wellness Visits do not include a physical exam. Some assessments may be limited, especially if the visit was conducted virtually. If needed, we may recommend an in-person follow-up with your provider.  Ongoing Care Seeing your primary care provider every 3 to 6 months helps us  monitor your health and provide consistent, personalized care.  Consider updating your shingles vaccines.   Referrals If a referral was made during today's visit and you haven't received any updates within two weeks, please contact the referred provider directly to check on the status.  Recommended Screenings:  Health Maintenance  Topic Date Due   Zoster (Shingles) Vaccine (1 of 2) 02/28/1983   Flu Shot  09/01/2024*   Medicare Annual Wellness Visit  05/18/2025   Pneumococcal Vaccine for age over 24  Completed   Osteoporosis screening with Bone Density Scan  Completed   Meningitis B Vaccine  Aged Out   DTaP/Tdap/Td vaccine  Discontinued   Breast Cancer Screening  Discontinued   COVID-19 Vaccine  Discontinued  *Topic was postponed. The date shown is not the original due date.       05/18/2024   11:38 AM  Advanced Directives  Does Patient Have a Medical Advance Directive? Yes  Type of Estate Agent of [place];Living will  Does patient want to make changes to medical advance directive? No - Patient declined  Copy of Healthcare Power of Attorney in Chart? No - copy requested    Vision: Annual vision screenings are recommended for early detection of glaucoma, cataracts, and diabetic retinopathy. These exams can also reveal signs of chronic conditions such as diabetes and high blood pressure.  Dental: Annual dental screenings help  detect early signs of oral cancer, gum disease, and other conditions linked to overall health, including heart disease and diabetes.  Please see the attached documents for additional preventive care recommendations.

## 2024-05-18 NOTE — Progress Notes (Cosign Needed)
 Chief Complaint  Patient presents with   Medicare Wellness     Subjective:   Laurie [name] is a 89 y.o. female who presents for a Medicare Annual Wellness Visit.  Visit info / Clinical Intake: Medicare Wellness Visit Type:: Subsequent Annual Wellness Visit Persons participating in visit and providing information:: patient Medicare Wellness Visit Mode:: Telephone If telephone:: video declined Since this visit was completed virtually, some vitals may be partially provided or unavailable. Missing vitals are due to the limitations of the virtual format.: Unable to obtain vitals - no equipment If Telephone or Video please confirm:: I connected with patient using audio/video enable telemedicine. I verified patient identity with two identifiers, discussed telehealth limitations, and patient agreed to proceed. Patient Location:: Home Provider Location:: Office/Home Interpreter Needed?: No Pre-visit prep was completed: yes AWV questionnaire completed by patient prior to visit?: no Living arrangements:: (!) lives alone Patient's Overall Health Status Rating: good Typical amount of pain: none Does pain affect daily life?: no Are you currently prescribed opioids?: no  Dietary Habits and Nutritional Risks How many meals a day?: 2 Eats fruit and vegetables daily?: yes Most meals are obtained by: preparing own meals In the last 2 weeks, have you had any of the following?: none Diabetic:: no  Functional Status Activities of Daily Living (to include ambulation/medication): Independent Ambulation: Independent Medication Administration: Independent Home Management (perform basic housework or laundry): Independent Manage your own finances?: yes Primary transportation is: driving Concerns about vision?: no *vision screening is required for WTM* Concerns about hearing?: no  Fall Screening Falls in the past year?: 0 Number of falls in past year: 0 Was there an injury with Fall?: 0 Fall  Risk Category Calculator: 0 Patient Fall Risk Level: Low Fall Risk  Fall Risk Patient at Risk for Falls Due to: No Fall Risks Fall risk Follow up: Falls evaluation completed; Falls prevention discussed  Home and Transportation Safety: All rugs have non-skid backing?: yes All stairs or steps have railings?: yes Grab bars in the bathtub or shower?: yes Have non-skid surface in bathtub or shower?: yes Good home lighting?: yes Regular seat belt use?: yes Hospital stays in the last year:: no  Cognitive Assessment Difficulty concentrating, remembering, or making decisions? : no Will 6CIT or Mini Cog be Completed: yes What year is it?: 0 points What month is it?: 0 points Give patient an address phrase to remember (5 components): [address] [place] About what time is it?: 0 points Count backwards from 20 to 1: 0 points Say the months of the year in reverse: 0 points Repeat the address phrase from earlier: 2 points 6 CIT Score: 2 points  Advance Directives (For Healthcare) Does Patient Have a Medical Advance Directive?: Yes Does patient want to make changes to medical advance directive?: No - Patient declined Type of Advance Directive: Healthcare Power of [place]; Living will Copy of Healthcare Power of Attorney in Chart?: No - copy requested Copy of Living Will in Chart?: No - copy requested  Reviewed/Updated  Reviewed/Updated: Reviewed All (Medical, Surgical, Family, Medications, Allergies, Care Teams, Patient Goals)    Allergies (verified) Hydralazine , Hydrochlorothiazide, Losartan, Metoprolol, and Triamterene   Current Medications (verified) Outpatient Encounter Medications as of 05/18/2024  Medication Sig   acetaminophen  (TYLENOL ) 500 MG tablet Take 500 mg by mouth every 6 (six) hours as needed.   Calcium  Carbonate-Vit D-Min (CALCIUM  1200 PO) Take 1,200 mg by mouth 2 (two) times daily.   chlorpheniramine (CHLOR-TRIMETON) 4 MG tablet Take 4  mg by mouth 2 (two)  times daily as needed for rhinitis.   cholecalciferol (VITAMIN D3) 25 MCG (1000 UNIT) tablet Take 1,000 Units by mouth daily.   diltiazem  (DILT-XR) 240 MG 24 hr capsule TAKE 1 CAPSULE BY MOUTH EVERY DAY (Patient taking differently: 180 mg. TAKE 1 CAPSULE BY MOUTH EVERY DAY)   estradiol  (ESTRACE ) 0.1 MG/GM vaginal cream Apply a small amount to urethra with fingertip nightly for 2 weeks,  then twice weekly thereafter   fish oil-omega-3 fatty acids 1000 MG capsule Take 1 g by mouth daily.    lisinopril  (ZESTRIL ) 40 MG tablet Take 1 tablet (40 mg total) by mouth daily.   Multiple Vitamins-Minerals (PRESERVISION AREDS 2 PO) Take 1 capsule by mouth daily. (Patient taking differently: Take 1 capsule by mouth in the morning and at bedtime.)   Polyethyl Glycol-Propyl Glycol (SYSTANE) 0.4-0.3 % SOLN Place 1 drop into both eyes daily as needed (Dry eye).   Rivaroxaban  (XARELTO ) 15 MG TABS tablet Take 1 tablet (15 mg total) by mouth daily.   rosuvastatin  (CRESTOR ) 20 MG tablet TAKE 1 TABLET BY MOUTH 2 TIMES A WEEK GENERIC EQUIVALENT FOR CRESTOR    trospium  (SANCTURA ) 20 MG tablet TAKE 1 TABLET BY MOUTH 2 TIMES DAILY   No facility-administered encounter medications on file as of 05/18/2024.    History: Past Medical History:  Diagnosis Date   Acquired thrombophilia    Aortic atherosclerosis    Arthritis    Bilateral carotid artery disease    a.) carotid doppler 10/22/2008: 30-40% LICA; b.) carotid doppler 01/01/2012: 1-39% BICA   Chronic atrial fibrillation (HCC)    a.) CHA2DS2VASc = 5 (age x2, sex, HTN, vascular disease) as of 07/26/2023; b.) rate/rhythm maintained on oral diltiazem ; chronically anticoagulated with rivaroxaban    Coronary artery calcification seen on CT scan    Essential hypertension    History of 2019 novel coronavirus disease (COVID-19)    Hyperlipidemia, unspecified hyperlipidemia type    Hypertension    On rivaroxaban  therapy    Overactive bladder    Plantar fasciitis     Pneumonia due to COVID-19 virus 06/28/2020   RBBB (right bundle branch block)    Umbilical hernia without obstruction and without gangrene    Past Surgical History:  Procedure Laterality Date   INSERTION OF MESH N/A 07/29/2023   Procedure: INSERTION OF MESH;  Surgeon: Rodolph Romano, MD;  Location: [facility] ORS;  Service: General;  Laterality: N/A;   JOINT REPLACEMENT     KNEE SURGERY Left 03/22/2022   TONSILLECTOMY     Family History  Problem Relation Age of Onset   Kidney disease Mother    Anemia Mother    Heart disease Father 66   Social History   Occupational History   Not on file  Tobacco Use   Smoking status: Never   Smokeless tobacco: Never  Vaping Use   Vaping status: Never Used  Substance and Sexual Activity   Alcohol use: No   Drug use: No   Sexual activity: Never   Tobacco Counseling Counseling given: Not Answered  SDOH Screenings   Food Insecurity: No Food Insecurity (05/18/2024)  Housing: Low Risk (05/18/2024)  Transportation Needs: No Transportation Needs (05/18/2024)  Utilities: Not At Risk (05/18/2024)  Alcohol Screen: Low Risk (05/18/2024)  Depression (PHQ2-9): Low Risk (05/18/2024)  Financial Resource Strain: Low Risk (05/18/2024)  Physical Activity: Insufficiently Active (05/18/2024)  Social Connections: Moderately Integrated (05/18/2024)  Stress: No Stress Concern Present (05/18/2024)  Tobacco Use: Low Risk (05/18/2024)  Health Literacy:  Adequate Health Literacy (05/18/2024)   See flowsheets for full screening details  Depression Screen PHQ 2 & 9 Depression Scale- Over the past 2 weeks, how often have you been bothered by any of the following problems? Little interest or pleasure in doing things: 0 Feeling down, depressed, or hopeless (PHQ Adolescent also includes...irritable): 0 PHQ-2 Total Score: 0 Trouble falling or staying asleep, or sleeping too much: 0 Feeling tired or having little energy: 0 Poor appetite or overeating (PHQ  Adolescent also includes...weight loss): 0 Feeling bad about yourself - or that you are a failure or have let yourself or your family down: 0 Trouble concentrating on things, such as reading the newspaper or watching television (PHQ Adolescent also includes...like school work): 0 Moving or speaking so slowly that other people could have noticed. Or the opposite - being so fidgety or restless that you have been moving around a lot more than usual: 0 Thoughts that you would be better off dead, or of hurting yourself in some way: 0 PHQ-9 Total Score: 0 If you checked off any problems, how difficult have these problems made it for you to do your work, take care of things at home, or get along with other people?: Not difficult at all     Goals Addressed             This Visit's Progress    Patient Stated       Wants to stay active              Objective:    Today's Vitals   05/18/24 1128  Weight: 130 lb (59 kg)  Height: 5' 4 (1.626 m)   Body mass index is 22.31 kg/m.  Hearing/Vision screen Hearing Screening - Comments:: No issues Vision Screening - Comments:: Glasses, Dr. Carolee, up to date Immunizations and Health Maintenance Health Maintenance  Topic Date Due   Zoster Vaccines- Shingrix (1 of 2) 02/28/1983   Influenza Vaccine  09/01/2024 (Originally 01/03/2024)   Medicare Annual Wellness (AWV)  05/18/2025   Pneumococcal Vaccine: 50+ Years  Completed   Bone Density Scan  Completed   Meningococcal B Vaccine  Aged Out   DTaP/Tdap/Td  Discontinued   Mammogram  Discontinued   COVID-19 Vaccine  Discontinued        Assessment/Plan:  This is a routine wellness examination for Chynna.  Patient Care Team: Marylynn Verneita CROME, MD as PCP - General (Internal Medicine) Darron Deatrice LABOR, MD as PCP - Cardiology (Cardiology)  I have personally reviewed and noted the following in the patients chart:   Medical and social history Use of alcohol, tobacco or illicit drugs  Current  medications and supplements including opioid prescriptions. Functional ability and status Nutritional status Physical activity Advanced directives List of other physicians Hospitalizations, surgeries, and ER visits in previous 12 months Vitals Screenings to include cognitive, depression, and falls Referrals and appointments  No orders of the defined types were placed in this encounter.  In addition, I have reviewed and discussed with patient certain preventive protocols, quality metrics, and best practice recommendations. A written personalized care plan for preventive services as well as general preventive health recommendations were provided to patient.   Angeline Fredericks, LPN   [id number]   Return in 1 year (on 05/18/2025).  After Visit Summary: (MyChart) Due to this being a telephonic visit, the after visit summary with patients personalized plan was offered to patient via MyChart   Nurse Notes: Patient declines shingles vaccines. Phone note sent to PCP.

## 2024-05-23 ENCOUNTER — Other Ambulatory Visit: Payer: Self-pay | Admitting: Internal Medicine

## 2024-05-29 ENCOUNTER — Other Ambulatory Visit: Payer: Self-pay | Admitting: Internal Medicine

## 2024-06-19 ENCOUNTER — Ambulatory Visit

## 2024-07-06 ENCOUNTER — Ambulatory Visit: Admitting: Internal Medicine

## 2024-07-09 ENCOUNTER — Telehealth: Payer: Self-pay | Admitting: Cardiovascular Disease

## 2024-07-09 DIAGNOSIS — I482 Chronic atrial fibrillation, unspecified: Secondary | ICD-10-CM

## 2024-07-09 MED ORDER — RIVAROXABAN 15 MG PO TABS: 15.0000 mg | ORAL_TABLET | Freq: Every day | ORAL | 5 refills | Status: AC

## 2024-07-09 NOTE — Telephone Encounter (Signed)
" °*  STAT* If patient is at the pharmacy, call can be transferred to refill team.   1. Which medications need to be refilled? (please list name of each medication and dose if known)   Rivaroxaban  (XARELTO ) 15 MG TABS tablet    2. Which pharmacy/location (including street and city if local pharmacy) is medication to be sent to? [facility] - [place], [place] - [address]   3. Do they need a 30 day or 90 day supply? 90  "

## 2024-07-09 NOTE — Telephone Encounter (Signed)
 Prescription refill request for Xarelto  received.  Indication: AFIB Last office visit: 03/2024 Weight: 130 LB 59 KG Age: 89  Scr: 0.98 (03/23/2024) CrCl: 35

## 2024-07-21 ENCOUNTER — Ambulatory Visit: Admitting: Internal Medicine

## 2024-08-04 ENCOUNTER — Ambulatory Visit: Admitting: Internal Medicine

## 2025-05-26 ENCOUNTER — Ambulatory Visit
